# Patient Record
Sex: Female | Born: 1935
Health system: Southern US, Community
[De-identification: ages and names within clinical notes are randomized; demographics above are authoritative.]

## PROBLEM LIST (undated history)

## (undated) DIAGNOSIS — E785 Hyperlipidemia, unspecified: Secondary | ICD-10-CM

## (undated) DIAGNOSIS — F329 Major depressive disorder, single episode, unspecified: Secondary | ICD-10-CM

## (undated) DIAGNOSIS — E538 Deficiency of other specified B group vitamins: Secondary | ICD-10-CM

## (undated) DIAGNOSIS — Z8719 Personal history of other diseases of the digestive system: Secondary | ICD-10-CM

## (undated) DIAGNOSIS — J329 Chronic sinusitis, unspecified: Secondary | ICD-10-CM

## (undated) DIAGNOSIS — J189 Pneumonia, unspecified organism: Secondary | ICD-10-CM

## (undated) DIAGNOSIS — E8881 Metabolic syndrome: Secondary | ICD-10-CM

## (undated) DIAGNOSIS — E876 Hypokalemia: Secondary | ICD-10-CM

## (undated) DIAGNOSIS — F32A Depression, unspecified: Secondary | ICD-10-CM

## (undated) DIAGNOSIS — E039 Hypothyroidism, unspecified: Secondary | ICD-10-CM

## (undated) DIAGNOSIS — K5790 Diverticulosis of intestine, part unspecified, without perforation or abscess without bleeding: Secondary | ICD-10-CM

## (undated) DIAGNOSIS — R911 Solitary pulmonary nodule: Secondary | ICD-10-CM

## (undated) DIAGNOSIS — N6019 Diffuse cystic mastopathy of unspecified breast: Secondary | ICD-10-CM

## (undated) DIAGNOSIS — R079 Chest pain, unspecified: Secondary | ICD-10-CM

## (undated) DIAGNOSIS — K219 Gastro-esophageal reflux disease without esophagitis: Secondary | ICD-10-CM

## (undated) DIAGNOSIS — I10 Essential (primary) hypertension: Secondary | ICD-10-CM

## (undated) DIAGNOSIS — K6289 Other specified diseases of anus and rectum: Secondary | ICD-10-CM

## (undated) DIAGNOSIS — N6009 Solitary cyst of unspecified breast: Secondary | ICD-10-CM

## (undated) DIAGNOSIS — K222 Esophageal obstruction: Secondary | ICD-10-CM

## (undated) DIAGNOSIS — Z8619 Personal history of other infectious and parasitic diseases: Secondary | ICD-10-CM

## (undated) DIAGNOSIS — M109 Gout, unspecified: Secondary | ICD-10-CM

## (undated) DIAGNOSIS — D509 Iron deficiency anemia, unspecified: Secondary | ICD-10-CM

## (undated) DIAGNOSIS — E119 Type 2 diabetes mellitus without complications: Secondary | ICD-10-CM

## (undated) DIAGNOSIS — M1990 Unspecified osteoarthritis, unspecified site: Secondary | ICD-10-CM

## (undated) DIAGNOSIS — R197 Diarrhea, unspecified: Secondary | ICD-10-CM

## (undated) DIAGNOSIS — K449 Diaphragmatic hernia without obstruction or gangrene: Secondary | ICD-10-CM

## (undated) DIAGNOSIS — D649 Anemia, unspecified: Secondary | ICD-10-CM

## (undated) DIAGNOSIS — R609 Edema, unspecified: Secondary | ICD-10-CM

## (undated) HISTORY — DX: Anemia, unspecified: D64.9

## (undated) HISTORY — DX: Deficiency of other specified B group vitamins: E53.8

## (undated) HISTORY — DX: Diaphragmatic hernia without obstruction or gangrene: K44.9

## (undated) HISTORY — DX: Esophageal obstruction: K22.2

## (undated) HISTORY — DX: Diarrhea, unspecified: R19.7

## (undated) HISTORY — DX: Diverticulosis of intestine, part unspecified, without perforation or abscess without bleeding: K57.90

## (undated) HISTORY — PX: REPLACEMENT TOTAL KNEE: SUR1224

## (undated) HISTORY — DX: Essential (primary) hypertension: I10

## (undated) HISTORY — DX: Hyperlipidemia, unspecified: E78.5

## (undated) HISTORY — DX: Personal history of other infectious and parasitic diseases: Z86.19

## (undated) HISTORY — DX: Other specified diseases of anus and rectum: K62.89

## (undated) HISTORY — PX: TUBAL LIGATION: SHX77

## (undated) HISTORY — DX: Gastro-esophageal reflux disease without esophagitis: K21.9

## (undated) HISTORY — DX: Unspecified osteoarthritis, unspecified site: M19.90

## (undated) HISTORY — DX: Solitary cyst of unspecified breast: N60.09

## (undated) HISTORY — PX: PARTIAL HYSTERECTOMY: SHX80

## (undated) HISTORY — DX: Personal history of other diseases of the digestive system: Z87.19

## (undated) HISTORY — DX: Type 2 diabetes mellitus without complications: E11.9

## (undated) HISTORY — DX: Chronic sinusitis, unspecified: J32.9

## (undated) HISTORY — DX: Metabolic syndrome: E88.81

## (undated) HISTORY — DX: Solitary pulmonary nodule: R91.1

## (undated) HISTORY — DX: Edema, unspecified: R60.9

## (undated) HISTORY — PX: BLADDER SUSPENSION: SHX72

## (undated) HISTORY — DX: Depression, unspecified: F32.A

## (undated) HISTORY — DX: Iron deficiency anemia, unspecified: D50.9

## (undated) HISTORY — DX: Gout, unspecified: M10.9

## (undated) HISTORY — PX: SHOULDER SURGERY: SHX246

## (undated) HISTORY — DX: Diffuse cystic mastopathy of unspecified breast: N60.19

## (undated) HISTORY — DX: Metabolic syndrome: E88.810

## (undated) HISTORY — DX: Hypothyroidism, unspecified: E03.9

## (undated) HISTORY — DX: Chest pain, unspecified: R07.9

## (undated) HISTORY — PX: TONSILLECTOMY AND ADENOIDECTOMY: SHX28

## (undated) HISTORY — DX: Major depressive disorder, single episode, unspecified: F32.9

## (undated) HISTORY — PX: BREAST CYST EXCISION: SHX579

## (undated) HISTORY — DX: Hypokalemia: E87.6

---

## 1991-03-15 DIAGNOSIS — J189 Pneumonia, unspecified organism: Secondary | ICD-10-CM

## 1991-03-15 HISTORY — DX: Pneumonia, unspecified organism: J18.9

## 1997-11-21 ENCOUNTER — Emergency Department (HOSPITAL_COMMUNITY): Admission: EM | Admit: 1997-11-21 | Discharge: 1997-11-21 | Payer: Self-pay | Admitting: Emergency Medicine

## 2000-08-02 ENCOUNTER — Encounter: Admission: RE | Admit: 2000-08-02 | Discharge: 2000-08-02 | Payer: Self-pay | Admitting: Internal Medicine

## 2000-08-02 ENCOUNTER — Encounter: Payer: Self-pay | Admitting: Internal Medicine

## 2000-11-24 ENCOUNTER — Ambulatory Visit (HOSPITAL_BASED_OUTPATIENT_CLINIC_OR_DEPARTMENT_OTHER): Admission: RE | Admit: 2000-11-24 | Discharge: 2000-11-24 | Payer: Self-pay | Admitting: Orthopedic Surgery

## 2001-09-25 ENCOUNTER — Encounter: Admission: RE | Admit: 2001-09-25 | Discharge: 2001-09-25 | Payer: Self-pay | Admitting: Internal Medicine

## 2001-09-25 ENCOUNTER — Encounter: Payer: Self-pay | Admitting: Internal Medicine

## 2002-01-22 ENCOUNTER — Other Ambulatory Visit: Admission: RE | Admit: 2002-01-22 | Discharge: 2002-01-22 | Payer: Self-pay | Admitting: Obstetrics and Gynecology

## 2003-07-23 ENCOUNTER — Inpatient Hospital Stay (HOSPITAL_COMMUNITY): Admission: RE | Admit: 2003-07-23 | Discharge: 2003-07-28 | Payer: Self-pay | Admitting: Orthopedic Surgery

## 2003-12-05 ENCOUNTER — Inpatient Hospital Stay (HOSPITAL_COMMUNITY): Admission: RE | Admit: 2003-12-05 | Discharge: 2003-12-09 | Payer: Self-pay | Admitting: Orthopedic Surgery

## 2004-09-28 ENCOUNTER — Ambulatory Visit: Payer: Self-pay | Admitting: Gastroenterology

## 2004-10-08 ENCOUNTER — Ambulatory Visit: Payer: Self-pay | Admitting: Gastroenterology

## 2004-10-08 ENCOUNTER — Encounter (INDEPENDENT_AMBULATORY_CARE_PROVIDER_SITE_OTHER): Payer: Self-pay | Admitting: *Deleted

## 2004-11-23 ENCOUNTER — Ambulatory Visit: Payer: Self-pay | Admitting: Gastroenterology

## 2004-12-08 ENCOUNTER — Ambulatory Visit: Payer: Self-pay | Admitting: Gastroenterology

## 2005-01-05 ENCOUNTER — Encounter: Admission: RE | Admit: 2005-01-05 | Discharge: 2005-01-05 | Payer: Self-pay | Admitting: Internal Medicine

## 2005-10-24 ENCOUNTER — Encounter: Admission: RE | Admit: 2005-10-24 | Discharge: 2005-10-24 | Payer: Self-pay | Admitting: Internal Medicine

## 2006-08-31 ENCOUNTER — Ambulatory Visit: Payer: Self-pay | Admitting: Gastroenterology

## 2006-08-31 LAB — CONVERTED CEMR LAB
Basophils Absolute: 0.1 10*3/uL (ref 0.0–0.1)
Basophils Relative: 1.5 % — ABNORMAL HIGH (ref 0.0–1.0)
Eosinophils Absolute: 0.1 10*3/uL (ref 0.0–0.6)
Eosinophils Relative: 1.5 % (ref 0.0–5.0)
Ferritin: 7.7 ng/mL — ABNORMAL LOW (ref 10.0–291.0)
Folate: 11.3 ng/mL
Iron: 47 ug/dL (ref 42–145)
Lymphocytes Relative: 25.8 % (ref 12.0–46.0)
MCV: 83.9 fL (ref 78.0–100.0)
Monocytes Relative: 10.4 % (ref 3.0–11.0)
Neutrophils Relative %: 60.8 % (ref 43.0–77.0)
RBC: 4.65 M/uL (ref 3.87–5.11)
RDW: 12.9 % (ref 11.5–14.6)
Saturation Ratios: 7.9 % — ABNORMAL LOW (ref 20.0–50.0)
Transferrin: 427.6 mg/dL — ABNORMAL HIGH (ref >=212.0)
Vitamin B-12: 234 pg/mL (ref 211–911)
WBC: 7.3 10*3/uL (ref 4.5–10.5)

## 2007-03-23 ENCOUNTER — Ambulatory Visit: Payer: Self-pay | Admitting: Gastroenterology

## 2007-03-23 LAB — CONVERTED CEMR LAB
ALT: 15 units/L (ref 0–35)
AST: 21 units/L (ref 0–37)
BUN: 15 mg/dL (ref 6–23)
Basophils Relative: 0.6 % (ref 0.0–1.0)
Bilirubin, Direct: 0.1 mg/dL (ref 0.0–0.3)
Ferritin: 4.2 ng/mL — ABNORMAL LOW (ref 10.0–291.0)
Folate: 11.2 ng/mL
HCT: 25.2 % — ABNORMAL LOW (ref 36.0–46.0)
Iron: 13 ug/dL — ABNORMAL LOW (ref 42–145)
MCHC: 33.8 g/dL (ref 30.0–36.0)
MCV: 83.6 fL (ref 78.0–100.0)
Monocytes Relative: 10.8 % (ref 3.0–11.0)
Neutrophils Relative %: 66.8 % (ref 43.0–77.0)
Sodium: 136 meq/L (ref 135–145)
TSH: 2.31 microintl units/mL (ref 0.35–5.50)
Total Bilirubin: 0.7 mg/dL (ref 0.3–1.2)
Total Protein: 6.9 g/dL (ref 6.0–8.3)
Vitamin B-12: 216 pg/mL (ref 211–911)
WBC: 8.5 10*3/uL (ref 4.5–10.5)

## 2007-03-28 ENCOUNTER — Encounter: Payer: Self-pay | Admitting: Gastroenterology

## 2007-03-28 ENCOUNTER — Ambulatory Visit: Payer: Self-pay | Admitting: Gastroenterology

## 2007-04-02 ENCOUNTER — Ambulatory Visit: Payer: Self-pay | Admitting: Cardiovascular Disease

## 2007-04-05 ENCOUNTER — Ambulatory Visit: Payer: Self-pay | Admitting: Gastroenterology

## 2007-04-11 ENCOUNTER — Ambulatory Visit: Payer: Self-pay | Admitting: Gastroenterology

## 2007-04-11 ENCOUNTER — Ambulatory Visit: Payer: Self-pay

## 2007-04-11 ENCOUNTER — Ambulatory Visit: Payer: Self-pay | Admitting: Cardiology

## 2007-04-27 ENCOUNTER — Ambulatory Visit: Payer: Self-pay | Admitting: Gastroenterology

## 2007-04-27 LAB — CONVERTED CEMR LAB
Basophils Absolute: 0.1 10*3/uL (ref 0.0–0.1)
Eosinophils Relative: 1.7 % (ref 0.0–5.0)
Hemoglobin: 11 g/dL — ABNORMAL LOW (ref 12.0–15.0)
Lymphocytes Relative: 31.6 % (ref 12.0–46.0)
MCHC: 31.9 g/dL (ref 30.0–36.0)
Monocytes Absolute: 0.8 10*3/uL — ABNORMAL HIGH (ref 0.2–0.7)
Monocytes Relative: 12.8 % — ABNORMAL HIGH (ref 3.0–11.0)
Platelets: 416 10*3/uL — ABNORMAL HIGH (ref 150–400)
RBC: 4.36 M/uL (ref 3.87–5.11)
RDW: 16.2 % — ABNORMAL HIGH (ref 11.5–14.6)
Saturation Ratios: 2.8 % — ABNORMAL LOW (ref 20.0–50.0)
Transferrin: 406.4 mg/dL — ABNORMAL HIGH (ref >=212.0)
Vitamin B-12: 561 pg/mL (ref 211–911)

## 2007-05-08 ENCOUNTER — Ambulatory Visit: Payer: Self-pay | Admitting: Gastroenterology

## 2007-05-08 LAB — CONVERTED CEMR LAB
OCCULT 1: NEGATIVE
OCCULT 2: NEGATIVE
OCCULT 5: NEGATIVE

## 2007-05-11 ENCOUNTER — Ambulatory Visit: Payer: Self-pay | Admitting: Gastroenterology

## 2007-05-24 ENCOUNTER — Ambulatory Visit: Payer: Self-pay | Admitting: Gastroenterology

## 2007-06-08 ENCOUNTER — Ambulatory Visit: Payer: Self-pay | Admitting: Gastroenterology

## 2007-07-07 DIAGNOSIS — I10 Essential (primary) hypertension: Secondary | ICD-10-CM

## 2007-07-07 DIAGNOSIS — K449 Diaphragmatic hernia without obstruction or gangrene: Secondary | ICD-10-CM | POA: Insufficient documentation

## 2007-07-07 DIAGNOSIS — E039 Hypothyroidism, unspecified: Secondary | ICD-10-CM | POA: Insufficient documentation

## 2007-07-07 DIAGNOSIS — E7800 Pure hypercholesterolemia, unspecified: Secondary | ICD-10-CM

## 2007-07-07 DIAGNOSIS — K219 Gastro-esophageal reflux disease without esophagitis: Secondary | ICD-10-CM

## 2007-07-07 DIAGNOSIS — M1990 Unspecified osteoarthritis, unspecified site: Secondary | ICD-10-CM | POA: Insufficient documentation

## 2007-07-10 ENCOUNTER — Ambulatory Visit: Payer: Self-pay | Admitting: Gastroenterology

## 2007-08-09 ENCOUNTER — Ambulatory Visit: Payer: Self-pay | Admitting: Gastroenterology

## 2007-09-07 ENCOUNTER — Ambulatory Visit: Payer: Self-pay | Admitting: Gastroenterology

## 2007-09-07 DIAGNOSIS — E538 Deficiency of other specified B group vitamins: Secondary | ICD-10-CM | POA: Insufficient documentation

## 2007-10-08 ENCOUNTER — Ambulatory Visit: Payer: Self-pay | Admitting: Gastroenterology

## 2007-11-05 ENCOUNTER — Ambulatory Visit: Payer: Self-pay | Admitting: Gastroenterology

## 2007-12-06 ENCOUNTER — Ambulatory Visit: Payer: Self-pay | Admitting: Gastroenterology

## 2007-12-10 ENCOUNTER — Ambulatory Visit: Payer: Self-pay | Admitting: Cardiovascular Disease

## 2007-12-10 LAB — CONVERTED CEMR LAB
BUN: 12 mg/dL (ref 6–23)
CO2: 34 meq/L — ABNORMAL HIGH (ref 19–32)
Calcium: 9.3 mg/dL (ref 8.4–10.5)
Chloride: 107 meq/L (ref 96–112)
Creatinine, Ser: 1.2 mg/dL (ref 0.4–1.2)
GFR calc non Af Amer: 46 mL/min
Glucose, Bld: 113 mg/dL — ABNORMAL HIGH (ref 70–99)

## 2007-12-21 ENCOUNTER — Ambulatory Visit: Payer: Self-pay | Admitting: Internal Medicine

## 2007-12-21 LAB — CONVERTED CEMR LAB
Bacteria, UA: NEGATIVE
Basophils Absolute: 0 10*3/uL (ref 0.0–0.1)
Bilirubin Urine: NEGATIVE
Bilirubin, Direct: 0.3 mg/dL (ref 0.0–0.3)
CO2: 32 meq/L (ref 19–32)
Creatinine, Ser: 1.3 mg/dL — ABNORMAL HIGH (ref 0.4–1.2)
Eosinophils Absolute: 0.1 10*3/uL (ref 0.0–0.7)
Eosinophils Relative: 0.9 % (ref 0.0–5.0)
Folate: 8.8 ng/mL
GFR calc Af Amer: 51 mL/min
Glucose, Bld: 111 mg/dL — ABNORMAL HIGH (ref 70–99)
Hemoglobin, Urine: NEGATIVE
Hgb A1c MFr Bld: 6.5 % — ABNORMAL HIGH (ref 4.6–6.0)
Lymphocytes Relative: 22.6 % (ref 12.0–46.0)
Neutro Abs: 6.4 10*3/uL (ref 1.4–7.7)
Nitrite: NEGATIVE
Potassium: 4 meq/L (ref 3.5–5.1)
RBC: 4.77 M/uL (ref 3.87–5.11)
RDW: 13.4 % (ref 11.5–14.6)
Sodium: 141 meq/L (ref 135–145)
Specific Gravity, Urine: 1.01 (ref 1.000–1.03)
TSH: 3.03 microintl units/mL (ref 0.35–5.50)
Total CHOL/HDL Ratio: 3.6
Total Protein, Urine: NEGATIVE mg/dL
Urobilinogen, UA: 0.2 (ref 0.0–1.0)

## 2008-01-03 ENCOUNTER — Ambulatory Visit: Payer: Self-pay | Admitting: Gastroenterology

## 2008-01-21 ENCOUNTER — Ambulatory Visit: Payer: Self-pay | Admitting: Internal Medicine

## 2008-01-21 DIAGNOSIS — E8881 Metabolic syndrome: Secondary | ICD-10-CM

## 2008-01-21 DIAGNOSIS — E1165 Type 2 diabetes mellitus with hyperglycemia: Secondary | ICD-10-CM

## 2008-01-21 DIAGNOSIS — E1129 Type 2 diabetes mellitus with other diabetic kidney complication: Secondary | ICD-10-CM | POA: Insufficient documentation

## 2008-01-21 LAB — CONVERTED CEMR LAB
Cholesterol, target level: 200 mg/dL
HDL goal, serum: 40 mg/dL

## 2008-01-23 ENCOUNTER — Ambulatory Visit: Payer: Self-pay | Admitting: Cardiovascular Disease

## 2008-01-23 LAB — CONVERTED CEMR LAB
Calcium: 9.1 mg/dL (ref 8.4–10.5)
Creatinine, Ser: 1.4 mg/dL — ABNORMAL HIGH (ref 0.4–1.2)
GFR calc Af Amer: 47 mL/min
GFR calc non Af Amer: 39 mL/min
Glucose, Bld: 117 mg/dL — ABNORMAL HIGH (ref 70–99)
Sodium: 142 meq/L (ref 135–145)

## 2008-01-30 ENCOUNTER — Encounter: Payer: Self-pay | Admitting: Internal Medicine

## 2008-02-04 ENCOUNTER — Ambulatory Visit: Payer: Self-pay | Admitting: Gastroenterology

## 2008-03-04 ENCOUNTER — Ambulatory Visit: Payer: Self-pay | Admitting: Gastroenterology

## 2008-03-25 ENCOUNTER — Ambulatory Visit: Payer: Self-pay | Admitting: Internal Medicine

## 2008-03-25 LAB — CONVERTED CEMR LAB
ALT: 21 units/L (ref 0–35)
AST: 26 units/L (ref 0–37)
BUN: 20 mg/dL (ref 6–23)
CO2: 31 meq/L (ref 19–32)
Chloride: 104 meq/L (ref 96–112)
Creatinine, Ser: 1.4 mg/dL — ABNORMAL HIGH (ref 0.4–1.2)
GFR calc Af Amer: 47 mL/min
GFR calc non Af Amer: 39 mL/min
Leukocytes, UA: NEGATIVE
Microalb Creat Ratio: 2.3 mg/g (ref 0.0–30.0)
Mucus, UA: NEGATIVE
Nitrite: NEGATIVE
Potassium: 4.6 meq/L (ref 3.5–5.1)
RBC / HPF: NONE SEEN
Total Protein, Urine: NEGATIVE mg/dL
Urine Glucose: NEGATIVE mg/dL
Urobilinogen, UA: 0.2 (ref 0.0–1.0)
pH: 6 (ref 5.0–8.0)

## 2008-03-26 ENCOUNTER — Encounter: Payer: Self-pay | Admitting: Internal Medicine

## 2008-04-01 ENCOUNTER — Ambulatory Visit: Payer: Self-pay | Admitting: Gastroenterology

## 2008-05-02 ENCOUNTER — Ambulatory Visit: Payer: Self-pay | Admitting: Gastroenterology

## 2008-05-13 ENCOUNTER — Ambulatory Visit: Payer: Self-pay | Admitting: Cardiovascular Disease

## 2008-05-21 ENCOUNTER — Encounter: Payer: Self-pay | Admitting: Cardiovascular Disease

## 2008-05-21 ENCOUNTER — Ambulatory Visit: Payer: Self-pay

## 2008-05-30 ENCOUNTER — Ambulatory Visit: Payer: Self-pay | Admitting: Gastroenterology

## 2008-06-06 ENCOUNTER — Encounter: Payer: Self-pay | Admitting: Internal Medicine

## 2008-06-25 ENCOUNTER — Ambulatory Visit: Payer: Self-pay | Admitting: Internal Medicine

## 2008-06-25 LAB — CONVERTED CEMR LAB
AST: 21 units/L (ref 0–37)
Alkaline Phosphatase: 74 units/L (ref 39–117)
BUN: 19 mg/dL (ref 6–23)
Basophils Absolute: 0 10*3/uL (ref 0.0–0.1)
Basophils Relative: 0.1 % (ref 0.0–3.0)
Bilirubin Urine: NEGATIVE
Bilirubin, Direct: 0.1 mg/dL (ref 0.0–0.3)
CO2: 33 meq/L — ABNORMAL HIGH (ref 19–32)
Calcium: 9.2 mg/dL (ref 8.4–10.5)
Cholesterol: 273 mg/dL — ABNORMAL HIGH (ref 0–200)
Creatinine,U: 162.1 mg/dL
Eosinophils Absolute: 0.1 10*3/uL (ref 0.0–0.7)
Eosinophils Relative: 1.3 % (ref 0.0–5.0)
HDL: 39.6 mg/dL (ref >39.00)
Hgb A1c MFr Bld: 6.4 % (ref 4.6–6.5)
Ketones, ur: NEGATIVE mg/dL
Lymphocytes Relative: 17.1 % (ref 12.0–46.0)
Lymphs Abs: 1.9 10*3/uL (ref 0.7–4.0)
Magnesium: 2 mg/dL (ref 1.5–2.5)
Microalb Creat Ratio: 51.2 mg/g — ABNORMAL HIGH (ref 0.0–30.0)
Microalb, Ur: 8.3 mg/dL — ABNORMAL HIGH (ref 0.0–1.9)
Monocytes Absolute: 0.7 10*3/uL (ref 0.1–1.0)
RBC: 4.7 M/uL (ref 3.87–5.11)
RDW: 12.9 % (ref 11.5–14.6)
TSH: 4.42 microintl units/mL (ref 0.35–5.50)
Total CHOL/HDL Ratio: 7
Total Protein: 7.4 g/dL (ref 6.0–8.3)
Urine Glucose: NEGATIVE mg/dL
Urobilinogen, UA: 0.2 (ref 0.0–1.0)
VLDL: 40.8 mg/dL — ABNORMAL HIGH (ref 0.0–40.0)
Vitamin B-12: 1071 pg/mL — ABNORMAL HIGH (ref 211–911)
pH: 5.5 (ref 5.0–8.0)

## 2008-06-26 ENCOUNTER — Encounter: Payer: Self-pay | Admitting: Internal Medicine

## 2008-07-01 ENCOUNTER — Ambulatory Visit: Payer: Self-pay | Admitting: Gastroenterology

## 2008-07-25 ENCOUNTER — Ambulatory Visit: Payer: Self-pay | Admitting: Internal Medicine

## 2008-07-25 LAB — CONVERTED CEMR LAB
Hemoglobin, Urine: NEGATIVE
Ketones, ur: NEGATIVE mg/dL
pH: 5.5 (ref 5.0–8.0)

## 2008-07-27 ENCOUNTER — Telehealth: Payer: Self-pay | Admitting: Internal Medicine

## 2008-07-31 ENCOUNTER — Ambulatory Visit: Payer: Self-pay | Admitting: Gastroenterology

## 2008-09-04 ENCOUNTER — Ambulatory Visit: Payer: Self-pay | Admitting: Internal Medicine

## 2008-09-10 ENCOUNTER — Ambulatory Visit: Payer: Self-pay | Admitting: Internal Medicine

## 2008-09-11 ENCOUNTER — Encounter: Payer: Self-pay | Admitting: Internal Medicine

## 2008-09-17 ENCOUNTER — Encounter: Payer: Self-pay | Admitting: Internal Medicine

## 2008-09-17 ENCOUNTER — Encounter: Admission: RE | Admit: 2008-09-17 | Discharge: 2008-09-17 | Payer: Self-pay | Admitting: Internal Medicine

## 2008-10-06 ENCOUNTER — Ambulatory Visit: Payer: Self-pay | Admitting: Gastroenterology

## 2008-10-06 ENCOUNTER — Telehealth: Payer: Self-pay | Admitting: Internal Medicine

## 2008-10-09 ENCOUNTER — Ambulatory Visit: Payer: Self-pay | Admitting: Internal Medicine

## 2008-10-11 ENCOUNTER — Encounter: Payer: Self-pay | Admitting: Internal Medicine

## 2008-11-06 ENCOUNTER — Ambulatory Visit: Payer: Self-pay | Admitting: Gastroenterology

## 2008-11-21 DIAGNOSIS — E785 Hyperlipidemia, unspecified: Secondary | ICD-10-CM | POA: Insufficient documentation

## 2008-11-21 DIAGNOSIS — D649 Anemia, unspecified: Secondary | ICD-10-CM | POA: Insufficient documentation

## 2008-11-24 ENCOUNTER — Ambulatory Visit: Payer: Self-pay | Admitting: Cardiovascular Disease

## 2008-12-08 ENCOUNTER — Ambulatory Visit: Payer: Self-pay | Admitting: Gastroenterology

## 2009-01-05 ENCOUNTER — Ambulatory Visit: Payer: Self-pay | Admitting: Gastroenterology

## 2009-02-04 ENCOUNTER — Ambulatory Visit: Payer: Self-pay | Admitting: Gastroenterology

## 2009-03-10 ENCOUNTER — Ambulatory Visit: Payer: Self-pay | Admitting: Gastroenterology

## 2009-03-14 HISTORY — PX: CATARACT EXTRACTION, BILATERAL: SHX1313

## 2009-03-14 LAB — HM MAMMOGRAPHY: HM Mammogram: NORMAL

## 2009-04-10 ENCOUNTER — Ambulatory Visit: Payer: Self-pay | Admitting: Gastroenterology

## 2009-05-08 ENCOUNTER — Ambulatory Visit: Payer: Self-pay | Admitting: Gastroenterology

## 2009-06-09 ENCOUNTER — Ambulatory Visit: Payer: Self-pay | Admitting: Gastroenterology

## 2009-06-09 ENCOUNTER — Encounter (INDEPENDENT_AMBULATORY_CARE_PROVIDER_SITE_OTHER): Payer: Self-pay | Admitting: *Deleted

## 2009-06-12 DIAGNOSIS — K5790 Diverticulosis of intestine, part unspecified, without perforation or abscess without bleeding: Secondary | ICD-10-CM

## 2009-06-12 HISTORY — DX: Diverticulosis of intestine, part unspecified, without perforation or abscess without bleeding: K57.90

## 2009-06-24 ENCOUNTER — Ambulatory Visit: Payer: Self-pay | Admitting: Gastroenterology

## 2009-06-24 LAB — HM COLONOSCOPY

## 2009-06-26 ENCOUNTER — Telehealth: Payer: Self-pay | Admitting: Gastroenterology

## 2009-07-10 ENCOUNTER — Ambulatory Visit: Payer: Self-pay | Admitting: Gastroenterology

## 2009-07-10 LAB — CONVERTED CEMR LAB
Albumin: 4.1 g/dL (ref 3.5–5.2)
BUN: 13 mg/dL (ref 6–23)
Basophils Absolute: 0.1 10*3/uL (ref 0.0–0.1)
Basophils Relative: 0.9 % (ref 0.0–3.0)
Bilirubin, Direct: 0.1 mg/dL (ref 0.0–0.3)
Calcium: 9.4 mg/dL (ref 8.4–10.5)
Eosinophils Absolute: 0.1 10*3/uL (ref 0.0–0.7)
Eosinophils Relative: 1.9 % (ref 0.0–5.0)
Ferritin: 16.6 ng/mL (ref 10.0–291.0)
Folate: >20 ng/mL
GFR calc non Af Amer: 38.65 mL/min (ref >60)
Glucose, Bld: 106 mg/dL — ABNORMAL HIGH (ref 70–99)
Lymphs Abs: 1.6 10*3/uL (ref 0.7–4.0)
Neutrophils Relative %: 66.2 % (ref 43.0–77.0)
Platelets: 331 10*3/uL (ref 150.0–400.0)
Potassium: 4 meq/L (ref 3.5–5.1)
RDW: 12.4 % (ref 11.5–14.6)
Total Protein: 7.6 g/dL (ref 6.0–8.3)
Transferrin: 335.3 mg/dL (ref 212.0–360.0)
Vitamin B-12: >1500 pg/mL — ABNORMAL HIGH (ref 211–911)
WBC: 7 10*3/uL (ref 4.5–10.5)

## 2009-07-23 ENCOUNTER — Encounter: Payer: Self-pay | Admitting: Internal Medicine

## 2009-09-04 ENCOUNTER — Inpatient Hospital Stay (HOSPITAL_COMMUNITY): Admission: RE | Admit: 2009-09-04 | Discharge: 2009-09-07 | Payer: Self-pay | Admitting: Surgery

## 2009-10-14 ENCOUNTER — Encounter: Payer: Self-pay | Admitting: Internal Medicine

## 2009-10-28 ENCOUNTER — Ambulatory Visit: Payer: Self-pay | Admitting: Internal Medicine

## 2009-10-28 ENCOUNTER — Encounter: Payer: Self-pay | Admitting: Internal Medicine

## 2009-10-28 DIAGNOSIS — R209 Unspecified disturbances of skin sensation: Secondary | ICD-10-CM

## 2009-10-28 LAB — CONVERTED CEMR LAB
AST: 22 units/L (ref 0–37)
Albumin: 4.1 g/dL (ref 3.5–5.2)
Basophils Absolute: 0.1 10*3/uL (ref 0.0–0.1)
Bilirubin Urine: NEGATIVE
Bilirubin, Direct: 0.1 mg/dL (ref 0.0–0.3)
Cholesterol: 259 mg/dL — ABNORMAL HIGH (ref 0–200)
Direct LDL: 189.4 mg/dL
Eosinophils Absolute: 0.2 10*3/uL (ref 0.0–0.7)
GFR calc non Af Amer: 35.65 mL/min (ref >60)
Glucose, Bld: 96 mg/dL (ref 70–99)
HDL: 41.6 mg/dL (ref >39.00)
Hemoglobin, Urine: NEGATIVE
Hemoglobin: 13.2 g/dL (ref 12.0–15.0)
Ketones, ur: NEGATIVE mg/dL
Leukocytes, UA: NEGATIVE
Lymphocytes Relative: 26.2 % (ref 12.0–46.0)
Monocytes Relative: 11.6 % (ref 3.0–12.0)
Neutrophils Relative %: 58.3 % (ref 43.0–77.0)
Nitrite: NEGATIVE
Sodium: 143 meq/L (ref 135–145)
Specific Gravity, Urine: 1.02 (ref 1.000–1.030)
TSH: 2.17 microintl units/mL (ref 0.35–5.50)
Total Bilirubin: 0.7 mg/dL (ref 0.3–1.2)
Total CHOL/HDL Ratio: 6
Triglycerides: 256 mg/dL — ABNORMAL HIGH (ref 0.0–149.0)
Urine Glucose: NEGATIVE mg/dL
Urobilinogen, UA: 0.2 (ref 0.0–1.0)
VLDL: 51.2 mg/dL — ABNORMAL HIGH (ref 0.0–40.0)

## 2009-11-27 ENCOUNTER — Encounter: Payer: Self-pay | Admitting: Internal Medicine

## 2009-12-07 ENCOUNTER — Telehealth: Payer: Self-pay | Admitting: Gastroenterology

## 2009-12-14 ENCOUNTER — Ambulatory Visit: Payer: Self-pay | Admitting: Gastroenterology

## 2009-12-14 ENCOUNTER — Ambulatory Visit: Payer: Self-pay | Admitting: Internal Medicine

## 2009-12-14 DIAGNOSIS — M109 Gout, unspecified: Secondary | ICD-10-CM | POA: Insufficient documentation

## 2009-12-14 LAB — CONVERTED CEMR LAB
CO2: 27 meq/L (ref 19–32)
Chloride: 102 meq/L (ref 96–112)
GFR calc non Af Amer: 34.84 mL/min (ref >60)
Sodium: 140 meq/L (ref 135–145)
Vitamin B-12: >1500 pg/mL — ABNORMAL HIGH (ref 211–911)

## 2009-12-14 LAB — HM DIABETES FOOT EXAM

## 2010-01-12 ENCOUNTER — Ambulatory Visit: Payer: Self-pay | Admitting: Gastroenterology

## 2010-01-21 ENCOUNTER — Encounter: Payer: Self-pay | Admitting: Internal Medicine

## 2010-02-11 ENCOUNTER — Ambulatory Visit: Payer: Self-pay | Admitting: Gastroenterology

## 2010-03-11 ENCOUNTER — Ambulatory Visit: Payer: Self-pay | Admitting: Gastroenterology

## 2010-04-08 ENCOUNTER — Ambulatory Visit
Admission: RE | Admit: 2010-04-08 | Discharge: 2010-04-08 | Payer: Self-pay | Source: Home / Self Care | Attending: Gastroenterology | Admitting: Gastroenterology

## 2010-04-11 LAB — CONVERTED CEMR LAB
AST: 22 units/L (ref 0–37)
Albumin: 3.8 g/dL (ref 3.5–5.2)
Alkaline Phosphatase: 54 units/L (ref 39–117)
BUN: 17 mg/dL (ref 6–23)
Bilirubin Urine: NEGATIVE
Creatinine, Ser: 1.4 mg/dL — ABNORMAL HIGH (ref 0.4–1.2)
Eosinophils Absolute: 0.1 10*3/uL (ref 0.0–0.7)
Eosinophils Relative: 1.8 % (ref 0.0–5.0)
GFR calc non Af Amer: 38.72 mL/min (ref >60)
HCT: 41.8 % (ref 36.0–46.0)
Hemoglobin: 14.6 g/dL (ref 12.0–15.0)
Leukocytes, UA: NEGATIVE
Lymphs Abs: 1.7 10*3/uL (ref 0.7–4.0)
MCHC: 35.1 g/dL (ref 30.0–36.0)
MCV: 96.6 fL (ref 78.0–100.0)
Monocytes Absolute: 0.7 10*3/uL (ref 0.1–1.0)
Monocytes Relative: 10.3 % (ref 3.0–12.0)
Neutro Abs: 4.3 10*3/uL (ref 1.4–7.7)
Neutrophils Relative %: 61.8 % (ref 43.0–77.0)
RDW: 12.9 % (ref 11.5–14.6)
Total Bilirubin: 0.9 mg/dL (ref 0.3–1.2)
Total Protein: 7.2 g/dL (ref 6.0–8.3)
WBC: 6.8 10*3/uL (ref 4.5–10.5)

## 2010-04-13 NOTE — Letter (Signed)
Summary: [facility]   Imported By: Rise Patience 10/26/2009 15:46:27  _____________________________________________________________________  External Attachment:    Type:   Image     Comment:   External Document

## 2010-04-13 NOTE — Letter (Signed)
Summary: Results Follow-up Letter  [facility]   [place], [place] [address]   Phone: [phone]  Fax: [phone]    12/14/2009  [address] [place], [place]  [address]  Dear Ms. Swinford,   The following are the results of your recent test(s):  Test     Result     Kidney     mildly dysfunctional Gout test     high   _________________________________________________________  Please call for an appointment as directed _________________________________________________________ _________________________________________________________ _________________________________________________________  Sincerely,  Scarlette Calico MD [facility]

## 2010-04-13 NOTE — Progress Notes (Signed)
Summary: Surgical referral   Phone Note Outgoing Call   Call placed by: Alberteen Spindle RN,  June 26, 2009 3:03 PM Summary of Call: Per Dr. Sharlett Iles, pt needs referral to Dr Hassell Done at [facility] re fundoplication.  LM for [place] to call,  Records faxed. Initial call taken by: Alberteen Spindle RN,  June 26, 2009 3:03 PM  Follow-up for Phone Call        [place] calling.  Pt sch for appt to see Dr. Kathreen Cosier on May 12 at 10:00.  [facility] will notify pt.   Follow-up by: Alberteen Spindle RN,  June 26, 2009 4:14 PM

## 2010-04-13 NOTE — Assessment & Plan Note (Signed)
Summary: MONTHLY B12 AND OFFICE VISIT..AM.    History of Present Illness Visit Type: Follow-up Visit Primary GI MD: Verl Blalock MD [place] Primary Provider: Janith Lima MD Chief Complaint: Patient is having some right sided pain. She does not feel she is having a regular BM she would like a rx for Miralax so it can be covered by her insurance. She also has alot of questions about her new dx of a fatty liver, seen on an ultrasound ordered by Dr. Ronnald Ramp. She went also feels like her has something stuck in her throat all the time. She seen an ENT and was given alot of differnet mixture of meds which she is no longer taking.  History of Present Illness:   This patient is a 77 year old Caucasian female who has chronic iron deficiency anemia felt secondary to a large hiatal hernia with so-called Cameron erosions. She has had extensive GI evaluations over the last several years including endoscopies, colonoscopy, and pill camera exam. She is currently asymptomatic on Protonix 40 mg twice a day but does have cough, choking, and recurrent sinus problems despite treatment by ENT specialist. She desires allergy referral at this time.  She denies dysphagia ready hepatobiliary complaints. Recent abdominal ultrasound exam per Dr. Ronnald Ramp as showing fatty infiltration of her liver but liver function test are normal. She has intermittent right lower quadrant discomfort associated with constipation but no melena or hematochezia. She denies use of alcohol, cigarettes, or NSAIDs. She is on B12 replacement therapy has a history of hypertension and multiple surgical procedures include an appendectomy and cholecystectomy. She also has degenerative arthritis and chronic thyroid dysfunction.   GI Review of Systems    Reports abdominal pain.     Location of  Abdominal pain: right side.    Denies acid reflux, belching, bloating, chest pain, dysphagia with liquids, dysphagia with solids, heartburn, loss of appetite,  nausea, vomiting, vomiting blood, weight loss, and  weight gain.      Reports change in bowel habits.     Denies anal fissure, black tarry stools, constipation, diarrhea, diverticulosis, fecal incontinence, heme positive stool, hemorrhoids, irritable bowel syndrome, jaundice, light color stool, liver problems, rectal bleeding, and  rectal pain.    Current Medications (verified): 1)  Protonix 40 Mg  Tbec (Pantoprazole Sodium) .... Take One By Mouth Two Times A Day 2)  Hyzaar 50-12.5 Mg Tabs (Losartan Potassium-Hctz) .... One By Mouth Once Daily For High Blood Pressure 3)  Xalatan 0.005 % Soln (Latanoprost) .... One Drop in Both Eyes At Night 4)  Levoxyl 50 Mcg Tabs (Levothyroxine Sodium) .... Once Daily 5)  Multivitamins  Tabs (Multiple Vitamin) .... Take One By Mouth Once Daily 6)  Vitamin D 1000 Unit Tabs (Cholecalciferol) .... Take One By Mouth Once Daily  Allergies: 1)  ! Sulfa 2)  ! Codeine 3)  Cephalexin (Cephalexin) 4)  Septra (Sulfamethoxazole-Trimethoprim)  Past History:  Past medical, surgical, family and social histories (including risk factors) reviewed for relevance to current acute and chronic problems.  Past Medical History: Reviewed history from 11/21/2008 and no changes required. Current Problems:  CHEST PAIN (ICD-786.50) aytpical normal myovue 2009 HYPERTENSION, MILD (ICD-401.1) DYSPNEA (ICD-786.05) HYPERLIPIDEMIA (ICD-272.4) ANEMIA (ICD-285.9) EDEMA (ICD-782.3) DIARRHEA OF PRESUMED INFECTIOUS ORIGIN (ICD-009.3) UTI (ICD-599.0) FATIGUE, ACUTE (ICD-780.79) DYSPNEA ON EXERTION (ICD-786.09) ABDOMINAL PAIN RIGHT UPPER QUADRANT (A999333) DYSMETABOLIC SYNDROME (A999333) UNSPECIFIED SINUSITIS (ICD-473.9) AODM (ICD-250.00) B12 DEFICIENCY (ICD-266.2) HYPOKALEMIA (ICD-276.8) VITAMIN B12 DEFICIENCY (ICD-266.2) BREAST CYST, LEFT (ICD-610.0) APPENDECTOMY, HX OF (ICD-V45.79) CHOLECYSTECTOMY, HX OF (ICD-V45.79)  ARTHROSCOPY, RIGHT KNEE, HX OF  (ICD-V45.89) TONSILLECTOMY AND ADENOIDECTOMY, HX OF (ICD-V45.79) ANEMIA, IRON DEFICIENCY, CHRONIC (ICD-280.9) FIBROCYSTIC BREAST DISEASE (ICD-610.1) DEGENERATIVE JOINT DISEASE (ICD-715.90) LUNG NODULE (ICD-518.89) HYPERCHOLESTEROLEMIA (ICD-272.0) ACID REFLUX DISEASE (ICD-530.81) HIATAL HERNIA (ICD-553.3) HYPOTHYROIDISM (ICD-244.9) Breast cyst Acid reflux disease with esophageal stricture  Past Surgical History: BIL. SHOULDER SURGERY BLADDER TACK Total knee replacement Bilateral Hysterectomy part Tonsillectomy Tubal Ligation  Family History: Reviewed history from 12/21/2007 and no changes required. Family History of Arthritis Family History of Digestive disorder: brother has stomach cancer brother with bladder cancer Family History of Breast Cancer: maternal aunt  Social History: Reviewed history from 12/21/2007 and no changes required. Retired Married Never Smoked Alcohol use-no Drug use-no Regular exercise-yes Daily Caffeine Use  Review of Systems       The patient complains of allergy/sinus, arthritis/joint pain, back pain, change in vision, cough, depression-new, fatigue, headaches-new, hearing problems, muscle pains/cramps, night sweats, shortness of breath, sleeping problems, swelling of feet/legs, and urine leakage.  The patient denies anemia, anxiety-new, blood in urine, breast changes/lumps, confusion, coughing up blood, fainting, fever, heart murmur, heart rhythm changes, itching, menstrual pain, nosebleeds, pregnancy symptoms, skin rash, sore throat, swollen lymph glands, thirst - excessive , urination - excessive , urination changes/pain, vision changes, and voice change.   General:  Complains of fatigue; denies fever, chills, sweats, anorexia, weakness, malaise, weight loss, and sleep disorder. CV:  Complains of dyspnea on exertion; denies chest pains, angina, palpitations, syncope, orthopnea, PND, peripheral edema, and claudication. Resp:  Complains of dyspnea  with exercise, cough, and sputum; denies dyspnea at rest, wheezing, coughing up blood, and pleurisy. MS:  Complains of joint pain / LOM, joint swelling, muscle weakness, muscle cramps, and leg pain at night; denies joint stiffness, joint deformity, low back pain, muscle atrophy, leg pain with exertion, and shoulder pain / LOM hand / wrist pain (CTS). Psych:  Denies depression, anxiety, memory loss, suicidal ideation, hallucinations, paranoia, phobia, and confusion. Heme:  Denies bruising, bleeding, enlarged lymph nodes, and pagophagia.  Vital Signs:  Patient profile:   77 year old female Height:      62 inches Weight:      220.6 pounds BMI:     40.49 Pulse rate:   80 / minute Pulse rhythm:   regular BP sitting:   150 / 86  (left arm) Cuff size:   regular  Vitals Entered By: Bernita Buffy CMA Deborra Medina) (June 09, 2009 11:21 AM)  Physical Exam  General:  Well developed, well nourished, no acute distress.obese.   Head:  Normocephalic and atraumatic. Eyes:  PERRLA, no icterus. Mouth:  No deformity or lesions, dentition normal. Neck:  Supple; no masses or thyromegaly. Lungs:  Clear throughout to auscultation. Heart:  Regular rate and rhythm; no murmurs, rubs,  or bruits. Abdomen:  Soft, nontender and nondistended. No masses, hepatosplenomegaly or hernias noted. Normal bowel sounds.obese.   Rectal:  deferred until time of colonoscopy.   Msk:  joint tenderness, decreased ROM, and rhematoid changes.   Extremities:  No clubbing, cyanosis, edema or deformities noted.trace pedal edema.   Neurologic:  Alert and  oriented x4;  grossly normal neurologically. Cervical Nodes:  No significant cervical adenopathy. Psych:  Alert and cooperative. Normal mood and affect.poor concentration.     Impression & Recommendations:  Problem # 1:  ABDOMINAL PAIN RIGHT LOWER QUADRANT (ICD-789.03) Assessment Unchanged Followup colonoscopy indicated at this time. I also will repeat her labs to be sure that she  does not have recurrent iron deficiency anemia with  her shortness of breath and fatigue. We have prescribed nightly MiraLax and [facility] Colon Health tablets twice a day. Orders: TLB-BMP (Basic Metabolic Panel-BMET) (99991111) TLB-CBC Platelet - w/Differential (85025-CBCD) TLB-Hepatic/Liver Function Pnl (80076-HEPATIC) TLB-TSH (Thyroid Stimulating Hormone) (84443-TSH) TLB-B12, Serum-Total ONLY [id number]) TLB-Ferritin ([id number]) TLB-Folic Acid (Folate) (XX123456) TLB-IBC Pnl (Iron/FE;Transferrin) (83550-IBC)  Problem # 2:  OTHER CONSTIPATION (ICD-564.09) Assessment: Deteriorated  Orders: TLB-BMP (Basic Metabolic Panel-BMET) (99991111) TLB-CBC Platelet - w/Differential (85025-CBCD) TLB-Hepatic/Liver Function Pnl (80076-HEPATIC) TLB-TSH (Thyroid Stimulating Hormone) (84443-TSH) TLB-B12, Serum-Total ONLY [id number]) TLB-Ferritin ([id number]) TLB-Folic Acid (Folate) (XX123456) TLB-IBC Pnl (Iron/FE;Transferrin) (83550-IBC)  Problem # 3:  ANEMIA (ICD-285.9) Assessment: Unchanged Repeat labs pending. Also will do followup endoscopy the time of colonoscopy to visualize her suspected site of GI chronic blood loss. She is to continue her reflux regime and b.i.d. PPI therapy.  Problem # 4:  UNSPECIFIED SINUSITIS (ICD-473.9) Assessment: Deteriorated Allergy referral to [place] allergy specialist.  Patient Instructions: 1)  Please go to the basement for lab work. 2)  [place] twice a day. 3)  You will be scheduled for a colonoscopy and endoscopy. 4)  We will schedule you for an appt with the allergists. 5)  The medication list was reviewed and reconciled.  All changed / newly prescribed medications were explained.  A complete medication list was provided to the patient / caregiver. 6)  Copy sent to : Dr. Genelle Gather Crissie Sickles at [facility]. 7)  Please continue current medications.   Appended Document: MONTHLY B12 AND OFFICE  VISIT..AM.    Clinical Lists Changes  Medications: Added new medication of MOVIPREP 100 GM  SOLR (PEG-KCL-NACL-NASULF-NA ASC-C) As per prep instructions. - Signed Rx of MOVIPREP 100 GM  SOLR (PEG-KCL-NACL-NASULF-NA ASC-C) As per prep instructions.;  #1 x 0;  Signed;  Entered by: Alberteen Spindle RN;  Authorized by: Sable Feil MD [facility];  Method used: Electronically to [facility] [phone]*, [address] [place], [place], [place]  [id number], Ph: [phone] or [phone], Fax: [phone] Orders: Added new Test order of Colon/Endo (Colon/Endo) - Signed    Prescriptions: MOVIPREP 100 GM  SOLR (PEG-KCL-NACL-NASULF-NA ASC-C) As per prep instructions.  #1 x 0   Entered by:   Alberteen Spindle RN   Authorized by:   Sable Feil MD [facility]   Signed by:   Alberteen Spindle RN on 06/09/2009   Method used:   Electronically to        [facility] [phone]* (retail)       [place], [place]  [id number]       Ph: [phone] or [phone]       Fax: [phone]   RxID:   [id number]    Appended Document: MONTHLY B12 AND OFFICE VISIT..AM.    Clinical Lists Changes  Orders: Added new Service order of Vit B12 1000 mcg ([id number]) - Signed       Medication Administration  Injection # 1:    Medication: Vit B12 1000 mcg    Diagnosis: B12 DEFICIENCY (ICD-266.2)    Route: IM    Site: R deltoid    Exp Date: 12/12    Lot #: [id number]    Mfr: [place]    Patient tolerated injection without complications    Given by: Alberteen Spindle RN (June 09, 2009 1:37 PM)  Orders Added: 1)  Vit B12 1000 mcg [id number]

## 2010-04-13 NOTE — Assessment & Plan Note (Signed)
Summary: b 12 & flu shot...as.  Nurse Visit   Allergies: 1)  ! Sulfa 2)  ! Codeine 3)  Cephalexin (Cephalexin) 4)  Septra  Medication Administration  Injection # 1:    Medication: Vit B12 1000 mcg    Diagnosis: VITAMIN B12 DEFICIENCY (ICD-266.2)    Route: IM    Site: R deltoid    Exp Date: 09/2011    Lot #: [id number]    Mfr: [employer] [place]    Comments: pt to schedule next monthly b12 at front desk    Patient tolerated injection without complications    Given by: Christian Mate CMA Deborra Medina) (December 14, 2009 11:29 AM)  Orders Added: 1)  Flu Vaccine 51yrs + MEDICARE PATIENTS [Q2039] 2)  Administration Flu vaccine - MCR [id number] 3)  Vit B12 1000 mcg [J3420] Flu Vaccine Consent Questions     Do you have a history of severe allergic reactions to this vaccine? no    Any prior history of allergic reactions to egg and/or gelatin? no    Do you have a sensitivity to the preservative Thimersol? no    Do you have a past history of Guillan-Barre Syndrome? no    Do you currently have an acute febrile illness? no    Have you ever had a severe reaction to latex? no    Vaccine information given and explained to patient? yes    Are you currently pregnant? no    Lot Number:[id number]   Exp Date:09/01/2010   Manufacturer: [employer]    Site Given  Left Deltoid [email or web address]

## 2010-04-13 NOTE — Assessment & Plan Note (Signed)
Summary: B12 SHOT  Nurse Visit  Medication Administration  Injection # 1:    Medication: Vit B12 1000 mcg    Diagnosis: VITAMIN B12 DEFICIENCY (ICD-266.2)    Route: IM    Site: L deltoid    Exp Date: 02/2011    Lot #: [id number]    Mfr: [employer] Regent    Comments: pt will hold B12 injectins for 6 months and return in October 2011 for repeat B12.    Patient tolerated injection without complications    Given by: Abelino Derrick CMA Deborra Medina) (July 10, 2009 11:25 AM)  Orders Added: 1)  Vit B12 1000 mcg [id number]

## 2010-04-13 NOTE — Assessment & Plan Note (Signed)
Summary: follow up to discuss labs and xrays and nerve conduction stud...   Vital Signs:  Patient profile:   77 year old female Height:      62 inches Weight:      207 pounds O2 Sat:      97 % on Room air Temp:     97.7 degrees F oral Pulse rate:   84 / minute Pulse rhythm:   regular Resp:     16 per minute BP sitting:   140 / 80  (left arm) Cuff size:   large  Vitals Entered By: Estell Harpin CMA (December 14, 2009 12:55 PM)  O2 Flow:  Room air CC: follow-up visit test results, Hypertension Management Is Patient Diabetic? No  Does patient need assistance? Functional Status Self care Ambulation Normal   Primary Care Provider:  Janith Lima MD  CC:  follow-up visit test results and Hypertension Management.  History of Present Illness: She returns for f/up and she complains of diffuse toe pain and she wants to be tested and treated for Gout.  Hypertension History:      She denies headache, chest pain, palpitations, dyspnea with exertion, orthopnea, PND, peripheral edema, visual symptoms, neurologic problems, syncope, and side effects from treatment.  She notes no problems with any antihypertensive medication side effects.        Positive major cardiovascular risk factors include female age 46 years old or older, diabetes, hyperlipidemia, and hypertension.  Negative major cardiovascular risk factors include negative family history for ischemic heart disease and non-tobacco-user status.        Further assessment for target organ damage reveals no history of ASHD, cardiac end-organ damage (CHF/LVH), stroke/TIA, peripheral vascular disease, renal insufficiency, or hypertensive retinopathy.     Allergies (verified): 1)  ! Sulfa 2)  ! Codeine 3)  Cephalexin (Cephalexin) 4)  Septra  Past History:  Past Medical History: Last updated: 11/21/2008 Current Problems:  CHEST PAIN (ICD-786.50) aytpical normal myovue 2009 HYPERTENSION, MILD (ICD-401.1) DYSPNEA  (ICD-786.05) HYPERLIPIDEMIA (ICD-272.4) ANEMIA (ICD-285.9) EDEMA (ICD-782.3) DIARRHEA OF PRESUMED INFECTIOUS ORIGIN (ICD-009.3) UTI (ICD-599.0) FATIGUE, ACUTE (ICD-780.79) DYSPNEA ON EXERTION (ICD-786.09) ABDOMINAL PAIN RIGHT UPPER QUADRANT (A999333) DYSMETABOLIC SYNDROME (A999333) UNSPECIFIED SINUSITIS (ICD-473.9) AODM (ICD-250.00) B12 DEFICIENCY (ICD-266.2) HYPOKALEMIA (ICD-276.8) VITAMIN B12 DEFICIENCY (ICD-266.2) BREAST CYST, LEFT (ICD-610.0) APPENDECTOMY, HX OF (ICD-V45.79) CHOLECYSTECTOMY, HX OF (ICD-V45.79) ARTHROSCOPY, RIGHT KNEE, HX OF (ICD-V45.89) TONSILLECTOMY AND ADENOIDECTOMY, HX OF (ICD-V45.79) ANEMIA, IRON DEFICIENCY, CHRONIC (ICD-280.9) FIBROCYSTIC BREAST DISEASE (ICD-610.1) DEGENERATIVE JOINT DISEASE (ICD-715.90) LUNG NODULE (ICD-518.89) HYPERCHOLESTEROLEMIA (ICD-272.0) ACID REFLUX DISEASE (ICD-530.81) HIATAL HERNIA (ICD-553.3) HYPOTHYROIDISM (ICD-244.9) Breast cyst Acid reflux disease with esophageal stricture  Past Surgical History: Last updated: 06/09/2009 BIL. SHOULDER SURGERY BLADDER TACK Total knee replacement Bilateral Hysterectomy part Tonsillectomy Tubal Ligation  Family History: Last updated: 06/09/2009 Family History of Arthritis Family History of Digestive disorder: brother has stomach cancer brother with bladder cancer Family History of Breast Cancer: maternal aunt  Social History: Last updated: 06/09/2009 Retired Married Never Smoked Alcohol use-no Drug use-no Regular exercise-yes Daily Caffeine Use  Risk Factors: Alcohol Use: 0 (10/28/2009) Exercise: yes (12/21/2007)  Risk Factors: Smoking Status: never (10/28/2009) Passive Smoke Exposure: yes (10/28/2009)  Family History: Reviewed history from 06/09/2009 and no changes required. Family History of Arthritis Family History of Digestive disorder: brother has stomach cancer brother with bladder cancer Family History of Breast Cancer: maternal aunt  Social  History: Reviewed history from 06/09/2009 and no changes required. Retired Married Never Smoked Alcohol use-no Drug use-no Regular exercise-yes Daily Caffeine Use  Review of Systems       The patient complains of weight gain.  The patient denies anorexia, fever, chest pain, syncope, dyspnea on exertion, peripheral edema, prolonged cough, headaches, hemoptysis, abdominal pain, enlarged lymph nodes, and angioedema.   MS:  Complains of joint pain; denies joint redness, joint swelling, loss of strength, muscle aches, and muscle weakness.  Physical Exam  General:  alert, well-developed, well-nourished, well-hydrated, good hygiene, and overweight-appearing.   Head:  normocephalic and atraumatic.   Eyes:  vision grossly intact and pupils equal.   Mouth:  Oral mucosa and oropharynx without lesions or exudates.  Teeth in good repair. Neck:  supple, full ROM, no masses, no carotid bruits, and no neck tenderness.   Lungs:  Normal respiratory effort, chest expands symmetrically. Lungs are clear to auscultation, no crackles or wheezes. Heart:  Normal rate and regular rhythm. S1 and S2 normal without gallop, murmur, click, rub or other extra sounds. Abdomen:  Bowel sounds positive,abdomen soft and non-tender without masses, organomegaly or hernias noted. Msk:  normal ROM, no joint tenderness, no joint swelling, no joint warmth, no redness over joints, no joint deformities, no joint instability, no crepitation, and no muscle atrophy.   Pulses:  R and L carotid,radial,femoral,dorsalis pedis and posterior tibial pulses are full and equal bilaterally Extremities:  1+ left pedal edema and 1+ right pedal edema.   Neurologic:  No cranial nerve deficits noted. Station and gait are normal. Plantar reflexes are down-going bilaterally. DTRs are symmetrical throughout. Sensory, motor and coordinative functions appear intact. Cervical Nodes:  no anterior cervical adenopathy and no posterior cervical adenopathy.    Axillary Nodes:  no R axillary adenopathy and no L axillary adenopathy.   Psych:  Cognition and judgment appear intact. Alert and cooperative with normal attention span and concentration. No apparent delusions, illusions, hallucinations  Diabetes Management Exam:    Foot Exam (with socks and/or shoes not present):       Sensory-Pinprick/Light touch:          Left medial foot (L-4): normal          Left dorsal foot (L-5): normal          Left lateral foot (S-1): normal          Right medial foot (L-4): normal          Right dorsal foot (L-5): normal          Right lateral foot (S-1): normal       Sensory-Monofilament:          Left foot: normal          Right foot: normal       Inspection:          Left foot: normal          Right foot: normal       Nails:          Left foot: normal          Right foot: normal   Impression & Recommendations:  Problem # 1:  GOUT (ICD-274.9) Assessment New  Her updated medication list for this problem includes:    Colcrys 0.6 Mg Tabs (Colchicine) ..... One by mouth two times a day for gout pain  Orders: Venipuncture [id number]) TLB-BMP (Basic Metabolic Panel-BMET) (99991111) TLB-Uric Acid, Blood (84550-URIC)  Problem # 2:  HYPERTENSION, MILD (ICD-401.1) Assessment: Unchanged  Her updated medication list for this problem includes:    Hyzaar 50-12.5 Mg Tabs (Losartan potassium-hctz) ..... One by  mouth once daily for high blood pressure  Orders: Venipuncture [id number]) TLB-BMP (Basic Metabolic Panel-BMET) (99991111) TLB-Uric Acid, Blood (84550-URIC)  BP today: 140/80 Prior BP: 128/70 (10/28/2009)  Prior 10 Yr Risk Heart Disease: Not enough information (06/25/2008)  Labs Reviewed: K+: 4.4 (10/28/2009) Creat: : 1.5 (10/28/2009)   Chol: 259 (10/28/2009)   HDL: 41.60 (10/28/2009)   LDL: DEL (12/21/2007)   TG: 256.0 (10/28/2009)  Problem # 3:  PARESTHESIA (ICD-782.0) based on the NCS and EMG this looks like it is a combined insult  from B12 deficiency and Type II DM.  Problem # 4:  AODM (ICD-250.00) Assessment: Improved  Her updated medication list for this problem includes:    Hyzaar 50-12.5 Mg Tabs (Losartan potassium-hctz) ..... One by mouth once daily for high blood pressure  Orders: Ophthalmology Referral (Ophthalmology)  Labs Reviewed: Creat: 1.5 (10/28/2009)     Last Eye Exam: normal (03/03/2008) Reviewed HgBA1c results: 6.7 (10/28/2009)  6.4 (09/10/2008)  Problem # 5:  HYPERCHOLESTEROLEMIA (ICD-272.0) Assessment: Deteriorated  Her updated medication list for this problem includes:    Vytorin 10-20 Mg Tabs (Ezetimibe-simvastatin) ..... One by mouth once daily for cholesterol  Labs Reviewed: SGOT: 22 (10/28/2009)   SGPT: 20 (10/28/2009)  Lipid Goals: Chol Goal: 200 (01/21/2008)   HDL Goal: 40 (01/21/2008)   LDL Goal: 100 (01/21/2008)   TG Goal: 150 (01/21/2008)  Prior 10 Yr Risk Heart Disease: Not enough information (06/25/2008)   HDL:41.60 (10/28/2009), 39.60 (06/25/2008)  LDL:DEL (12/21/2007)  Chol:259 (10/28/2009), 273 (06/25/2008)  Trig:256.0 (10/28/2009), 204.0 (06/25/2008)  Complete Medication List: 1)  Protonix 40 Mg Tbec (Pantoprazole sodium) .... Take one by mouth two times a day 2)  Hyzaar 50-12.5 Mg Tabs (Losartan potassium-hctz) .... One by mouth once daily for high blood pressure 3)  Xalatan 0.005 % Soln (Latanoprost) .... One drop in both eyes at night 4)  Levoxyl 50 Mcg Tabs (Levothyroxine sodium) .... Once daily 5)  Multivitamins Tabs (Multiple vitamin) .... Take one by mouth once daily 6)  Vitamin D 1000 Unit Tabs (Cholecalciferol) .... Take one by mouth once daily 7)  [place] (Probiotic product) .... Two times a day 8)  Vytorin 10-20 Mg Tabs (Ezetimibe-simvastatin) .... One by mouth once daily for cholesterol 9)  Colcrys 0.6 Mg Tabs (Colchicine) .... One by mouth two times a day for gout pain  Hypertension Assessment/Plan:      The patient's hypertensive  risk group is category C: Target organ damage and/or diabetes.  Today's blood pressure is 140/80.  Her blood pressure goal is < 130/80.  Patient Instructions: 1)  Please schedule a follow-up appointment in 2 months. 2)  It is important that you exercise regularly at least 20 minutes 5 times a week. If you develop chest pain, have severe difficulty breathing, or feel very tired , stop exercising immediately and seek medical attention. 3)  You need to lose weight. Consider a lower calorie diet and regular exercise.  4)  Check your blood sugars regularly. If your readings are usually above 200 or below 70 you should contact our office. 5)  It is important that your Diabetic A1c level is checked every 3 months. 6)  See your eye doctor yearly to check for diabetic eye damage. 7)  Check your feet each night for sore areas, calluses or signs of infection. 8)  Check your Blood Pressure regularly. If it is above 130/80: you should make an appointment. Prescriptions: COLCRYS 0.6 MG TABS (COLCHICINE) One by mouth two times a day  for gout pain  #60 x 11   Entered and Authorized by:   Janith Lima MD   Signed by:   Janith Lima MD on 12/14/2009   Method used:   Electronically to        [facility] [phone]* (retail)       [place], [place]  [id number]       Ph: [phone] or [phone]       Fax: [phone]   RxID:   [id number] VYTORIN 10-20 MG TABS (EZETIMIBE-SIMVASTATIN) One by mouth once daily for cholesterol  #116 x 0   Entered and Authorized by:   Janith Lima MD   Signed by:   Janith Lima MD on 12/14/2009   Method used:   Samples Given   RxID:   [id number]

## 2010-04-13 NOTE — Letter (Signed)
Summary: [facility]   Imported By: Phillis Knack 08/06/2009 13:19:38  _____________________________________________________________________  External Attachment:    Type:   Image     Comment:   External Document

## 2010-04-13 NOTE — Procedures (Signed)
Summary: Colonoscopy  Patient: [name] Note: All result statuses are Final unless otherwise noted.  Tests: (1) Colonoscopy (COL)   COL Colonoscopy           [facility]     [place], [place]  [id number]           COLONOSCOPY PROCEDURE REPORT           PATIENT:  Yesenia [name], Yesenia [name]  MR#:  [id number]     BIRTHDATE:  1929/04/22, 79 yrs. old  GENDER:  female     ENDOSCOPIST:  Loralee Pacas. Sharlett Iles, MD, [facility]     REF. BY:     PROCEDURE DATE:  06/24/2009     PROCEDURE:  Average-risk screening colonoscopy     G0121     ASA CLASS:  Class II     INDICATIONS:  Routine Risk Screening     MEDICATIONS:   Fentanyl 50 mcg IV, Versed 6 mg IV           DESCRIPTION OF PROCEDURE:   After the risks benefits and     alternatives of the procedure were thoroughly explained, informed     consent was obtained.  Digital rectal exam was performed and     revealed no abnormalities.   The LB [id number] endoscope     was introduced through the anus and advanced to the cecum, which     was identified by both the appendix and ileocecal valve, without     limitations.  The quality of the prep was excellent, using     MoviPrep.  The instrument was then slowly withdrawn as the colon     was fully examined.     <<PROCEDUREIMAGES>>           FINDINGS:  Mild diverticulosis was found in the sigmoid to     descending colon segments.  No polyps or cancers were seen.  This     was otherwise a normal examination of the colon.   Retroflexed     views in the rectum revealed hypertrophied anal papillae.    The     scope was then withdrawn from the patient and the procedure     completed.           COMPLICATIONS:  None     ENDOSCOPIC IMPRESSION:     1) Mild diverticulosis in the sigmoid to descending colon     segments     2) No polyps or cancers     3) Otherwise normal examination     4) Hypertrophied anal papillae     RECOMMENDATIONS:     1) high fiber diet     REPEAT  EXAM:  No           ______________________________     Loralee Pacas. Sharlett Iles, MD, [facility]           CC:  Janith Lima, MD           n.     Lorrin MaisMarland Kitchen   Loralee Pacas. Nechuma Boven at 06/24/2009 02:04 PM           Karna Christmas, [id number]  Note: An exclamation mark (!) indicates a result that was not dispersed into the flowsheet. Document Creation Date: 06/24/2009 2:05 PM _______________________________________________________________________  (1) Order result status: Final Collection or observation date-time: 06/24/2009 14:00 Requested date-time:  Receipt date-time:  Reported date-time:  Referring Physician:   Ordering Physician: Shanon Brow  Sharlett Iles [phone]) Specimen Source:  Source: Tawanna Cooler Order Number: [id number] Lab site:

## 2010-04-13 NOTE — Assessment & Plan Note (Signed)
Summary: yearly f/u    Vital Signs:  Patient profile:   77 year old female Height:      62 inches Weight:      209.31 pounds BMI:     38.42 O2 Sat:      97 % on Room air Temp:     97.9 degrees F oral Pulse rate:   72 / minute Pulse rhythm:   regular Resp:     16 per minute BP sitting:   128 / 70  (left arm) Cuff size:   regular  Vitals Entered By: Estell Harpin CMA (October 28, 2009 9:19 AM)  Nutrition Counseling: Patient's BMI is greater than 25 and therefore counseled on weight management options.  O2 Flow:  Room air CC: f/up Is Patient Diabetic? Yes Did you bring your meter with you today? No Pain Assessment Patient in pain? no        Primary Care Provider:  Janith Lima MD  CC:  f/up.  History of Present Illness: She returns for f/up and reports that she has had some left posterior hip pain for several months with no trauma or injury. She wants to have an xray done of the area.  She has worsening numbness/tingling/burning in her feet that she notices at night when she tries to sleep. Getting B12 replacement has not helped her symptoms any.  Preventive Screening-Counseling & Management  Alcohol-Tobacco     Alcohol drinks/day: 0     Smoking Status: never     Smoking Cessation Counseling: yes     Passive Smoke Exposure: yes  Current Medications (verified): 1)  Protonix 40 Mg  Tbec (Pantoprazole Sodium) .... Take One By Mouth Two Times A Day 2)  Hyzaar 50-12.5 Mg Tabs (Losartan Potassium-Hctz) .... One By Mouth Once Daily For High Blood Pressure 3)  Xalatan 0.005 % Soln (Latanoprost) .... One Drop in Both Eyes At Night 4)  Levoxyl 50 Mcg Tabs (Levothyroxine Sodium) .... Once Daily 5)  Multivitamins  Tabs (Multiple Vitamin) .... Take One By Mouth Once Daily 6)  Vitamin D 1000 Unit Tabs (Cholecalciferol) .... Take One By Mouth Once Daily 7)  [place] (Probiotic Product) .... Two Times A Day  Allergies (verified): 1)  ! Sulfa 2)  !  Codeine 3)  Cephalexin (Cephalexin) 4)  Septra (Sulfamethoxazole-Trimethoprim)  Past History:  Past Medical History: Last updated: 11/21/2008 Current Problems:  CHEST PAIN (ICD-786.50) aytpical normal myovue 2009 HYPERTENSION, MILD (ICD-401.1) DYSPNEA (ICD-786.05) HYPERLIPIDEMIA (ICD-272.4) ANEMIA (ICD-285.9) EDEMA (ICD-782.3) DIARRHEA OF PRESUMED INFECTIOUS ORIGIN (ICD-009.3) UTI (ICD-599.0) FATIGUE, ACUTE (ICD-780.79) DYSPNEA ON EXERTION (ICD-786.09) ABDOMINAL PAIN RIGHT UPPER QUADRANT (A999333) DYSMETABOLIC SYNDROME (A999333) UNSPECIFIED SINUSITIS (ICD-473.9) AODM (ICD-250.00) B12 DEFICIENCY (ICD-266.2) HYPOKALEMIA (ICD-276.8) VITAMIN B12 DEFICIENCY (ICD-266.2) BREAST CYST, LEFT (ICD-610.0) APPENDECTOMY, HX OF (ICD-V45.79) CHOLECYSTECTOMY, HX OF (ICD-V45.79) ARTHROSCOPY, RIGHT KNEE, HX OF (ICD-V45.89) TONSILLECTOMY AND ADENOIDECTOMY, HX OF (ICD-V45.79) ANEMIA, IRON DEFICIENCY, CHRONIC (ICD-280.9) FIBROCYSTIC BREAST DISEASE (ICD-610.1) DEGENERATIVE JOINT DISEASE (ICD-715.90) LUNG NODULE (ICD-518.89) HYPERCHOLESTEROLEMIA (ICD-272.0) ACID REFLUX DISEASE (ICD-530.81) HIATAL HERNIA (ICD-553.3) HYPOTHYROIDISM (ICD-244.9) Breast cyst Acid reflux disease with esophageal stricture  Past Surgical History: Last updated: 06/09/2009 BIL. SHOULDER SURGERY BLADDER TACK Total knee replacement Bilateral Hysterectomy part Tonsillectomy Tubal Ligation  Family History: Last updated: 06/09/2009 Family History of Arthritis Family History of Digestive disorder: brother has stomach cancer brother with bladder cancer Family History of Breast Cancer: maternal aunt  Social History: Last updated: 06/09/2009 Retired Married Never Smoked Alcohol use-no Drug use-no Regular exercise-yes Daily Caffeine Use  Risk  Factors: Alcohol Use: 0 (10/28/2009) Exercise: yes (12/21/2007)  Risk Factors: Smoking Status: never (10/28/2009) Passive Smoke Exposure: yes  (10/28/2009)  Family History: Reviewed history from 06/09/2009 and no changes required. Family History of Arthritis Family History of Digestive disorder: brother has stomach cancer brother with bladder cancer Family History of Breast Cancer: maternal aunt  Social History: Reviewed history from 06/09/2009 and no changes required. Retired Married Never Smoked Alcohol use-no Drug use-no Regular exercise-yes Daily Caffeine Use  Review of Systems  The patient denies anorexia, fever, weight loss, weight gain, chest pain, syncope, dyspnea on exertion, peripheral edema, prolonged cough, headaches, hemoptysis, abdominal pain, hematuria, suspicious skin lesions, transient blindness, difficulty walking, depression, enlarged lymph nodes, and angioedema.   MS:  Complains of joint pain, low back pain, and cramps; denies joint redness, joint swelling, loss of strength, mid back pain, muscle aches, muscle, muscle weakness, stiffness, and thoracic pain. Endo:  Denies cold intolerance, excessive hunger, excessive thirst, excessive urination, heat intolerance, polyuria, and weight change. Heme:  Denies abnormal bruising, bleeding, enlarge lymph nodes, fevers, pallor, and skin discoloration.  Physical Exam  General:  alert, well-developed, well-nourished, well-hydrated, good hygiene, and overweight-appearing.   Head:  normocephalic and atraumatic.   Eyes:  vision grossly intact and pupils equal.   Mouth:  Oral mucosa and oropharynx without lesions or exudates.  Teeth in good repair. Neck:  supple, full ROM, no masses, no carotid bruits, and no neck tenderness.   Lungs:  Normal respiratory effort, chest expands symmetrically. Lungs are clear to auscultation, no crackles or wheezes. Heart:  Normal rate and regular rhythm. S1 and S2 normal without gallop, murmur, click, rub or other extra sounds. Abdomen:  Bowel sounds positive,abdomen soft and non-tender without masses, organomegaly or hernias  noted. Msk:  normal ROM, no joint tenderness, no joint swelling, no joint warmth, no redness over joints, no joint deformities, no joint instability, no crepitation, and no muscle atrophy.   Pulses:  R and L carotid,radial,femoral,dorsalis pedis and posterior tibial pulses are full and equal bilaterally Extremities:  1+ left pedal edema and 1+ right pedal edema.   Neurologic:  No cranial nerve deficits noted. Station and gait are normal. Plantar reflexes are down-going bilaterally. DTRs are symmetrical throughout. Sensory, motor and coordinative functions appear intact. Skin:  turgor normal, color normal, no rashes, no suspicious lesions, no ecchymoses, no petechiae, no purpura, no ulcerations, and no edema.   Cervical Nodes:  no anterior cervical adenopathy and no posterior cervical adenopathy.   Axillary Nodes:  no R axillary adenopathy and no L axillary adenopathy.   Inguinal Nodes:  no R inguinal adenopathy and no L inguinal adenopathy.   Psych:  Cognition and judgment appear intact. Alert and cooperative with normal attention span and concentration. No apparent delusions, illusions, hallucinations  Diabetes Management Exam:    Foot Exam (with socks and/or shoes not present):       Sensory-Pinprick/Light touch:          Left medial foot (L-4): normal          Left dorsal foot (L-5): normal          Left lateral foot (S-1): normal          Right medial foot (L-4): normal          Right dorsal foot (L-5): normal          Right lateral foot (S-1): normal       Sensory-Monofilament:  Left foot: normal          Right foot: normal       Inspection:          Left foot: normal          Right foot: normal       Nails:          Left foot: normal          Right foot: normal   Impression & Recommendations:  Problem # 1:  HIP PAIN, LEFT (ICD-719.45) Assessment New  Orders: T-Hip Comp Left Min 2-views (73510TC)  Discussed use of medications, application of heat or cold, and  exercises.   Problem # 2:  PARESTHESIA (ICD-782.0) Assessment: Deteriorated  Orders: Neurology Referral (Neuro)  Problem # 3:  HYPERTENSION, MILD (ICD-401.1) Assessment: Improved  Her updated medication list for this problem includes:    Hyzaar 50-12.5 Mg Tabs (Losartan potassium-hctz) ..... One by mouth once daily for high blood pressure  Orders: Venipuncture [id number]) TLB-Lipid Panel (80061-LIPID) TLB-BMP (Basic Metabolic Panel-BMET) (99991111) TLB-CBC Platelet - w/Differential (85025-CBCD) TLB-Hepatic/Liver Function Pnl (80076-HEPATIC) TLB-TSH (Thyroid Stimulating Hormone) (84443-TSH) TLB-A1C / Hgb A1C (Glycohemoglobin) (83036-A1C) TLB-Udip w/ Micro (81001-URINE) T-Urine Culture (Spectrum Order) [id number])  BP today: 128/70 Prior BP: 150/86 (06/09/2009)  Prior 10 Yr Risk Heart Disease: Not enough information (06/25/2008)  Labs Reviewed: K+: 4.0 (06/09/2009) Creat: : 1.4 (06/09/2009)   Chol: 273 (06/25/2008)   HDL: 39.60 (06/25/2008)   LDL: DEL (12/21/2007)   TG: 204.0 (06/25/2008)  Problem # 4:  ANEMIA (ICD-285.9) Assessment: Unchanged  Problem # 5:  EDEMA (ICD-782.3) Assessment: Unchanged  Her updated medication list for this problem includes:    Hyzaar 50-12.5 Mg Tabs (Losartan potassium-hctz) ..... One by mouth once daily for high blood pressure  Orders: Venipuncture [id number]) TLB-Lipid Panel (80061-LIPID) TLB-BMP (Basic Metabolic Panel-BMET) (99991111) TLB-CBC Platelet - w/Differential (85025-CBCD) TLB-Hepatic/Liver Function Pnl (80076-HEPATIC) TLB-TSH (Thyroid Stimulating Hormone) (84443-TSH) TLB-A1C / Hgb A1C (Glycohemoglobin) (83036-A1C) TLB-Udip w/ Micro (81001-URINE) T-Urine Culture (Spectrum Order) [id number])  Discussed elevation of the legs, use of compression stockings, sodium restiction, and medication use.   Problem # 6:  AODM (ICD-250.00) Assessment: Unchanged  Her updated medication list for this problem includes:    Hyzaar  50-12.5 Mg Tabs (Losartan potassium-hctz) ..... One by mouth once daily for high blood pressure  Orders: Venipuncture [id number]) TLB-Lipid Panel (80061-LIPID) TLB-BMP (Basic Metabolic Panel-BMET) (99991111) TLB-CBC Platelet - w/Differential (85025-CBCD) TLB-Hepatic/Liver Function Pnl (80076-HEPATIC) TLB-TSH (Thyroid Stimulating Hormone) (84443-TSH) TLB-A1C / Hgb A1C (Glycohemoglobin) (83036-A1C) TLB-Udip w/ Micro (81001-URINE) T-Urine Culture (Spectrum Order) [id number]) Ophthalmology Referral (Ophthalmology)  Labs Reviewed: Creat: 1.4 (06/09/2009)     Last Eye Exam: normal (03/03/2008) Reviewed HgBA1c results: 6.4 (09/10/2008)  6.4 (06/25/2008)  Problem # 7:  VITAMIN B12 DEFICIENCY (ICD-266.2) Assessment: Improved  Orders: Venipuncture [id number]) TLB-Lipid Panel (80061-LIPID) TLB-BMP (Basic Metabolic Panel-BMET) (99991111) TLB-CBC Platelet - w/Differential (85025-CBCD) TLB-Hepatic/Liver Function Pnl (80076-HEPATIC) TLB-TSH (Thyroid Stimulating Hormone) (84443-TSH) TLB-A1C / Hgb A1C (Glycohemoglobin) (83036-A1C) TLB-Udip w/ Micro (81001-URINE) T-Urine Culture (Spectrum Order) [id number])  Problem # 8:  HYPERCHOLESTEROLEMIA (ICD-272.0) Assessment: Unchanged  Orders: Venipuncture [id number]) TLB-Lipid Panel (80061-LIPID) TLB-BMP (Basic Metabolic Panel-BMET) (99991111) TLB-CBC Platelet - w/Differential (85025-CBCD) TLB-Hepatic/Liver Function Pnl (80076-HEPATIC) TLB-TSH (Thyroid Stimulating Hormone) (84443-TSH) TLB-A1C / Hgb A1C (Glycohemoglobin) (83036-A1C) TLB-Udip w/ Micro (81001-URINE) T-Urine Culture (Spectrum Order) [id number])  Labs Reviewed: SGOT: 29 (06/09/2009)   SGPT: 25 (06/09/2009)  Lipid Goals: Chol Goal: 200 (01/21/2008)   HDL Goal: 40 (01/21/2008)   LDL Goal: 100 (01/21/2008)   TG  Goal: 150 (01/21/2008)  Prior 10 Yr Risk Heart Disease: Not enough information (06/25/2008)   HDL:39.60 (06/25/2008), 49.6 (12/21/2007)  LDL:DEL (12/21/2007)   Chol:273 (06/25/2008), 177 (12/21/2007)  Trig:204.0 (06/25/2008), 197 (12/21/2007)  Complete Medication List: 1)  Protonix 40 Mg Tbec (Pantoprazole sodium) .... Take one by mouth two times a day 2)  Hyzaar 50-12.5 Mg Tabs (Losartan potassium-hctz) .... One by mouth once daily for high blood pressure 3)  Xalatan 0.005 % Soln (Latanoprost) .... One drop in both eyes at night 4)  Levoxyl 50 Mcg Tabs (Levothyroxine sodium) .... Once daily 5)  Multivitamins Tabs (Multiple vitamin) .... Take one by mouth once daily 6)  Vitamin D 1000 Unit Tabs (Cholecalciferol) .... Take one by mouth once daily 7)  [place] (Probiotic product) .... Two times a day  Other Orders: Zoster (Shingles) Vaccine Live [id number]) Admin 1st Vaccine [phone])  Patient Instructions: 1)  Please schedule a follow-up appointment in 2 months. 2)  Tobacco is very bad for your health and your loved ones! You Should stop smoking!. 3)  Stop Smoking Tips: Choose a Quit date. Cut down before the Quit date. decide what you will do as a substitute when you feel the urge to smoke(gum,toothpick,exercise). 4)  It is important that you exercise regularly at least 20 minutes 5 times a week. If you develop chest pain, have severe difficulty breathing, or feel very tired , stop exercising immediately and seek medical attention. 5)  You need to lose weight. Consider a lower calorie diet and regular exercise.  6)  Check your blood sugars regularly. If your readings are usually above 200 or below 70 you should contact our office. 7)  It is important that your Diabetic A1c level is checked every 3 months. 8)  See your eye doctor yearly to check for diabetic eye damage. 9)  Check your feet each night for sore areas, calluses or signs of infection. 10)  Check your Blood Pressure regularly. If it is above 130/80: you should make an appointment. Prescriptions: LEVOXYL 50 MCG TABS (LEVOTHYROXINE SODIUM) once daily  #30 Tablet x 11    Entered and Authorized by:   Janith Lima MD   Signed by:   Janith Lima MD on 10/28/2009   Method used:   Electronically to        [facility] [phone]* (retail)       [place], [place]  [id number]       Ph: [phone] or [phone]       Fax: [phone]   RxID:   [id number] HYZAAR 50-12.5 MG TABS (LOSARTAN POTASSIUM-HCTZ) One by mouth once daily for high blood pressure  #30 Tablet x 11   Entered and Authorized by:   Janith Lima MD   Signed by:   Janith Lima MD on 10/28/2009   Method used:   Electronically to        [facility] [phone]* (retail)       [place], [place]  [id number]       Ph: [phone] or [phone]       Fax: [phone]   RxID:   [id number]   Preventive Care Screening  Bone Density:    Date:  03/14/2009    Results:  normal std  dev  Mammogram:    Date:  03/14/2009    Results:  normal     Immunizations Administered:  Zostavax # 1:    Vaccine Type: Zostavax    Site: left deltoid    Mfr: Merck    Dose: 0.65    Route: [place]    Given by: Estell Harpin CMA    Exp. Date: 08/21/2010    Lot #: [id number]    VIS given: 12/24/04 given October 28, 2009.

## 2010-04-13 NOTE — Progress Notes (Signed)
Summary: b12 injections    Phone Note Call from Patient Call back at Home Phone [phone]   Caller: Patient Call For: Dr. Sharlett Iles Reason for Call: Talk to Nurse Summary of Call: Wanting to see when her b-12 injection is due and also about her flu vaccine Initial call taken by: Webb Laws,  December 07, 2009 1:45 PM  Follow-up for Phone Call        tried the number listed in the chart and it is the wrong number, The patient should have been getting her b12 injections every month and it looks like she has not had one since april. She is due now and it is ok to give flu shot at the same time.  Follow-up by: Bernita Buffy CMA Deborra Medina),  December 07, 2009 2:38 PM

## 2010-04-13 NOTE — Letter (Signed)
Summary: [facility]   Imported By: Bubba Hales 02/09/2010 12:34:50  _____________________________________________________________________  External Attachment:    Type:   Image     Comment:   External Document

## 2010-04-13 NOTE — Assessment & Plan Note (Signed)
Summary: MONTHLY B12 SHOT...LSW.  Nurse Visit   Medication Administration  Injection # 1:    Medication: Vit B12 1000 mcg    Diagnosis: VITAMIN B12 DEFICIENCY (ICD-266.2)    Route: IM    Site: L deltoid    Exp Date: 09/2011    Lot #: [id number]    Mfr: American Regent    Comments: Pt will return on 12/1 for next injection    Patient tolerated injection without complications    Given by: Abelino Derrick CMA ([place]) (January 12, 2010 11:06 AM)  Orders Added: 1)  Vit B12 1000 mcg [id number]

## 2010-04-13 NOTE — Assessment & Plan Note (Signed)
Summary: MONTHLY B12/SP  Nurse Visit   Allergies: 1)  ! Sulfa 2)  Cephalexin (Cephalexin) 3)  Septra (Sulfamethoxazole-Trimethoprim)  Medication Administration  Injection # 1:    Medication: Vit B12 1000 mcg    Diagnosis: B12 DEFICIENCY (ICD-266.2)    Route: IM    Site: L deltoid    Exp Date: 01/13/2011    Lot #: [id number]    Mfr: American Regent    Patient tolerated injection without complications    Given by: Bernita Buffy CMA Deborra Medina) (May 08, 2009 10:44 AM) patient needs office visit before next injection

## 2010-04-13 NOTE — Letter (Signed)
Summary: Results Follow-up Letter  [facility] [place]   [place], [place] [address]   Phone: [phone]  Fax: [phone]    10/28/2009  [address] [place], [place]  [address]  Dear Ms. Brindisi,   The following are the results of your recent test(s):  Test     Result     Left Hip Xray   arthritis Kidney     mild dysfunction Liver       normal Blood sugars   acceptable Thyroid     normal  _________________________________________________________  Please call for an appointment as directed _________________________________________________________ _________________________________________________________ _________________________________________________________  Sincerely,  Scarlette Calico MD [facility]

## 2010-04-13 NOTE — Assessment & Plan Note (Signed)
Summary: 266.2/monthly b-12 inj  Nurse Visit   Medication Administration  Injection # 1:    Medication: Vit B12 1000 mcg    Diagnosis: VITAMIN B12 DEFICIENCY (ICD-266.2)    Route: IM    Site: L deltoid    Exp Date: 12/2010    Lot #: [id number]    Mfr: American Regent    Comments: Pt will return on 05/08/09 for next injection    Patient tolerated injection without complications    Given by: Abelino Derrick CMA Deborra Medina) (April 10, 2009 10:48 AM)  Orders Added: 1)  Vit B12 1000 mcg [id number]

## 2010-04-13 NOTE — Procedures (Signed)
Summary: Upper Endoscopy  Patient: [name] Note: All result statuses are Final unless otherwise noted.  Tests: (1) Upper Endoscopy (EGD)   EGD Upper Endoscopy       [facility]     [place], [place]  [id number]           ENDOSCOPY PROCEDURE REPORT           PATIENT:  Yesenia, [name]  MR#:  [id number]     BIRTHDATE:  06-03-1929, 47 yrs. old  GENDER:  female           ENDOSCOPIST:  Loralee Pacas. Sharlett Iles, MD, [facility]     Referred by:           PROCEDURE DATE:  06/24/2009     PROCEDURE:  EGD, diagnostic     ASA CLASS:  Class II     INDICATIONS:  GERD, heartburn, nausea and vomiting throat. globus     sensation.peresistant despite gerd rx.negative ENt exam.           MEDICATIONS:   There was residual sedation effect present from     prior procedure., Versed 1 mg IV     TOPICAL ANESTHETIC:           DESCRIPTION OF PROCEDURE:   After the risks benefits and     alternatives of the procedure were thoroughly explained, informed     consent was obtained.  The LB [id number] endoscope was     introduced through the mouth and advanced to the second portion of     the duodenum, limited by retching and gagging.   The instrument     was slowly withdrawn as the mucosa was fully examined.     <<PROCEDUREIMAGES>>           A hiatal hernia was found. LARGE 7-8 CM HH NOTED AND FREE REFLUX.     Retroflexed views revealed a hiatal hernia.    The scope was then     withdrawn from the patient and the procedure completed.           COMPLICATIONS:  None           ENDOSCOPIC IMPRESSION:     1) Hiatal hernia     LARGE HH.PRIOR CAMERON EROSIONS HAVE HEALED.     RECOMMENDATIONS:     REFER FOR FUNDOPLICATION SURGERY.           REPEAT EXAM:  No           ______________________________     Loralee Pacas. Sharlett Iles, MD, [facility] Regal           CC:  Janith Lima, MD, Johnathan Hausen, MD           n.     Lorrin Mais:   Loralee Pacas. Tawania Daponte at 06/24/2009 02:19 PM        Karna Christmas, [id number]  Note: An exclamation mark (!) indicates a result that was not dispersed into the flowsheet. Document Creation Date: 06/24/2009 2:20 PM _______________________________________________________________________  (1) Order result status: Final Collection or observation date-time: 06/24/2009 14:13 Requested date-time:  Receipt date-time:  Reported date-time:  Referring Physician:   Ordering Physician: Verl Blalock [id number]) Specimen Source:  Source: Tawanna Cooler Order Number: [id number] Lab site:

## 2010-04-13 NOTE — Assessment & Plan Note (Signed)
Summary: MONTHLY B12 INJECTION//SP  Nurse Visit   Allergies: 1)  ! Sulfa 2)  ! Codeine 3)  Cephalexin (Cephalexin) 4)  Septra  Medication Administration  Injection # 1:    Medication: Vit B12 1000 mcg    Diagnosis: VITAMIN B12 DEFICIENCY (ICD-266.2)    Route: IM    Site: R deltoid    Exp Date: 12/2011    Lot #: [id number]    Mfr: American Regent    Patient tolerated injection without complications    Given by: Bernita Buffy CMA ([place]) (February 11, 2010 1:46 PM)  Orders Added: 1)  Vit B12 1000 mcg [id number]

## 2010-04-13 NOTE — Letter (Signed)
Summary: [facility] Instructions   Gastroenterology  [place], [place] [address]   Phone: [phone]  Fax: [phone]       Yesenia [name]    10-24-1944    MRN: [id number]        Procedure Day Sudie Grumbling: Wednesday, 06/24/09     Arrival Time: 12:30      Procedure Time: 1:30     Location of Procedure:                    _X _  [place] (4th Floor)                        [place]   Starting 5 days prior to your procedure 06/19/09 do not eat nuts, seeds, popcorn, corn, beans, peas,  salads, or any raw vegetables.  Do not take any fiber supplements (e.g. Metamucil, Citrucel, and Benefiber).  THE DAY BEFORE YOUR PROCEDURE         DATE:  06/23/09   DAY: Tuesday  1.  Drink clear liquids the entire day-NO SOLID FOOD  2.  Do not drink anything colored red or purple.  Avoid juices with pulp.  No orange juice.  3.  Drink at least 64 oz. (8 glasses) of fluid/clear liquids during the day to prevent dehydration and help the prep work efficiently.  CLEAR LIQUIDS INCLUDE: Water Jello Ice Popsicles Tea (sugar ok, no milk/cream) Powdered fruit flavored drinks Coffee (sugar ok, no milk/cream) Gatorade Juice: apple, white grape, white cranberry  Lemonade Clear bullion, consomm, broth Carbonated beverages (any kind) Strained chicken noodle soup Hard Candy                             4.  In the morning, mix first dose of MoviPrep solution:    Empty 1 Pouch A and 1 Pouch B into the disposable container    Add lukewarm drinking water to the top line of the container. Mix to dissolve    Refrigerate (mixed solution should be used within 24 hrs)  5.  Begin drinking the prep at 5:00 p.m. The MoviPrep container is divided by 4 marks.   Every 15 minutes drink the solution down to the next mark (approximately 8 oz) until the full liter is complete.   6.  Follow completed prep with 16 oz of clear liquid of your choice (Nothing  red or purple).  Continue to drink clear liquids until bedtime.  7.  Before going to bed, mix second dose of MoviPrep solution:    Empty 1 Pouch A and 1 Pouch B into the disposable container    Add lukewarm drinking water to the top line of the container. Mix to dissolve    Refrigerate  THE DAY OF YOUR PROCEDURE      DATE: 06/24/09   DAY: Wednesday  Beginning at 8:30 a.m. (5 hours before procedure):         1. Every 15 minutes, drink the solution down to the next mark (approx 8 oz) until the full liter is complete.  2. Follow completed prep with 16 oz. of clear liquid of your choice.    3. You may drink clear liquids until 11:30  (2 HOURS BEFORE PROCEDURE).   MEDICATION INSTRUCTIONS  Unless otherwise instructed, you should take regular prescription medications with a small sip of water   as early as possible  the morning of your procedure.                  OTHER INSTRUCTIONS  You will need a responsible adult at least 75 years of age to accompany you and drive you home.   This person must remain in the waiting room during your procedure.  Wear loose fitting clothing that is easily removed.  Leave jewelry and other valuables at home.  However, you may wish to bring a book to read or  an iPod/MP3 player to listen to music as you wait for your procedure to start.  Remove all body piercing jewelry and leave at home.  Total time from sign-in until discharge is approximately 2-3 hours.  You should go home directly after your procedure and rest.  You can resume normal activities the  day after your procedure.  The day of your procedure you should not:   Drive   Make legal decisions   Operate machinery   Drink alcohol   Return to work  You will receive specific instructions about eating, activities and medications before you leave.    The above instructions have been reviewed and explained to me by   _______________________    I fully understand and  can verbalize these instructions _____________________________ Date _________

## 2010-04-13 NOTE — Letter (Signed)
Summary: Lipid Letter  [facility]   [place], [place] [address]   Phone: [phone]  Fax: [phone]    10/28/2009  Moksha Fraire [address] [place], [place]  [address]  Dear Ms. Matters:  We have carefully reviewed your last lipid profile from 12/21/2007 and the results are noted below with a summary of recommendations for lipid management.    Cholesterol:       259     Goal: <200 WOW   HDL "good" Cholesterol:   41.60     Goal: >40   LDL "bad" Cholesterol:   189     Goal: <100 WOW   Triglycerides:       256.0     Goal: <150 WOW        TLC Diet (Therapeutic Lifestyle Change): Saturated Fats & Transfatty acids should be kept < 7% of total calories ***Reduce Saturated Fats Polyunstaurated Fat can be up to 10% of total calories Monounsaturated Fat Fat can be up to 20% of total calories Total Fat should be no greater than 25-35% of total calories Carbohydrates should be 50-60% of total calories Protein should be approximately 15% of total calories Fiber should be at least 20-30 grams a day ***Increased fiber may help lower LDL Total Cholesterol should be < 200mg /day Consider adding plant stanol/sterols to diet (example: Benacol spread) ***A higher intake of unsaturated fat may reduce Triglycerides and Increase HDL    Adjunctive Measures (may lower LIPIDS and reduce risk of Heart Attack) include: Aerobic Exercise (20-30 minutes 3-4 times a week) Limit Alcohol Consumption Weight Reduction Aspirin 75-81 mg a day by mouth (if not allergic or contraindicated) Dietary Fiber 20-30 grams a day by mouth     Current Medications: 1)    Protonix 40 Mg  Tbec (Pantoprazole sodium) .... Take one by mouth two times a day 2)    Hyzaar 50-12.5 Mg Tabs (Losartan potassium-hctz) .... One by mouth once daily for high blood pressure 3)    Xalatan 0.005 % Soln (Latanoprost) .... One drop in both eyes at night 4)    Levoxyl 50 Mcg Tabs (Levothyroxine sodium) .... Once  daily 5)    Multivitamins  Tabs (Multiple vitamin) .... Take one by mouth once daily 6)    Vitamin D 1000 Unit Tabs (Cholecalciferol) .... Take one by mouth once daily 7)    [place] (Probiotic product) .... Two times a day  If you have any questions, please call. We appreciate being able to work with you.   Sincerely,    [facility] Janith Lima MD

## 2010-04-15 NOTE — Assessment & Plan Note (Signed)
Summary: MONTHLY B12 SHOT/JMS  Nurse Visit   Allergies: 1)  ! Sulfa 2)  ! Codeine 3)  Cephalexin (Cephalexin) 4)  Septra  Medication Administration  Injection # 1:    Medication: Vit B12 1000 mcg    Diagnosis: VITAMIN B12 DEFICIENCY (ICD-266.2)    Route: IM    Site: L deltoid    Exp Date: 12/2011    Lot #: [id number]    Mfr: American Regent    Patient tolerated injection without complications    Given by: Bernita Buffy CMA Deborra Medina) (March 11, 2010 12:08 PM)  Orders Added: 1)  Vit B12 1000 mcg [id number]

## 2010-04-15 NOTE — Assessment & Plan Note (Signed)
Summary: MONTHLY B12 SHOT/JMS  Nurse Visit   Allergies: 1)  ! Sulfa 2)  ! Codeine 3)  Cephalexin (Cephalexin) 4)  Septra  Medication Administration  Injection # 1:    Medication: Vit B12 1000 mcg    Diagnosis: VITAMIN B12 DEFICIENCY (ICD-266.2)    Route: IM    Site: R deltoid    Exp Date: 12/13/2011    Lot #: [id number]    Mfr: [place]    Patient tolerated injection without complications    Given by: Rosanne Sack RN (April 08, 2010 1:21 PM)  Orders Added: 1)  Vit B12 1000 mcg [id number]

## 2010-05-10 ENCOUNTER — Encounter: Payer: Self-pay | Admitting: Gastroenterology

## 2010-05-10 ENCOUNTER — Encounter (INDEPENDENT_AMBULATORY_CARE_PROVIDER_SITE_OTHER): Payer: Medicare Other

## 2010-05-10 DIAGNOSIS — E538 Deficiency of other specified B group vitamins: Secondary | ICD-10-CM

## 2010-05-20 NOTE — Assessment & Plan Note (Signed)
Summary: B12.Marland KitchenDewitt Hoes  Nurse Visit   Allergies: 1)  ! Sulfa 2)  ! Codeine 3)  Cephalexin (Cephalexin) 4)  Septra  Medication Administration  Injection # 1:    Medication: Vit B12 1000 mcg    Diagnosis: VITAMIN B12 DEFICIENCY (ICD-266.2)    Route: IM    Site: L deltoid    Exp Date: 01/2012    Lot #: [id number]    Mfr: American Regent    Comments: pt to schedule next monthly b12 at front desk    Patient tolerated injection without complications    Given by: Christian Mate CMA ([place]) (May 10, 2010 1:40 PM)  Orders Added: 1)  Vit B12 1000 mcg [id number]

## 2010-05-30 LAB — DIFFERENTIAL
Basophils Absolute: 0 10*3/uL (ref 0.0–0.1)
Eosinophils Absolute: 0 10*3/uL (ref 0.0–0.7)
Monocytes Absolute: 0.6 10*3/uL (ref 0.1–1.0)
Neutrophils Relative %: 88 % — ABNORMAL HIGH (ref 43–77)

## 2010-05-30 LAB — CBC
HCT: 34.6 % — ABNORMAL LOW (ref 36.0–46.0)
Platelets: 272 10*3/uL (ref 150–400)
RDW: 13.4 % (ref 11.5–15.5)

## 2010-05-31 LAB — BASIC METABOLIC PANEL
BUN: 19 mg/dL (ref 6–23)
Calcium: 9.3 mg/dL (ref 8.4–10.5)
Creatinine, Ser: 1.53 mg/dL — ABNORMAL HIGH (ref 0.4–1.2)
GFR calc non Af Amer: 33 mL/min — ABNORMAL LOW (ref >60)
Glucose, Bld: 104 mg/dL — ABNORMAL HIGH (ref 70–99)

## 2010-05-31 LAB — CBC
Hemoglobin: 12.8 g/dL (ref 12.0–15.0)
MCHC: 33.2 g/dL (ref 30.0–36.0)
Platelets: 298 10*3/uL (ref 150–400)
RDW: 13.3 % (ref 11.5–15.5)

## 2010-05-31 LAB — SURGICAL PCR SCREEN: Staphylococcus aureus: POSITIVE — AB

## 2010-06-09 ENCOUNTER — Ambulatory Visit (INDEPENDENT_AMBULATORY_CARE_PROVIDER_SITE_OTHER): Payer: Medicare Other | Admitting: Gastroenterology

## 2010-06-09 DIAGNOSIS — E538 Deficiency of other specified B group vitamins: Secondary | ICD-10-CM

## 2010-06-09 MED ORDER — CYANOCOBALAMIN 1000 MCG/ML IJ SOLN
1000.0000 ug | INTRAMUSCULAR | Status: AC
  Administered 2010-06-09: 1000 ug via INTRAMUSCULAR

## 2010-06-09 NOTE — Progress Notes (Signed)
Patient tolerated b12 well

## 2010-06-29 ENCOUNTER — Ambulatory Visit: Payer: Medicare Other | Admitting: Gastroenterology

## 2010-07-08 ENCOUNTER — Ambulatory Visit (INDEPENDENT_AMBULATORY_CARE_PROVIDER_SITE_OTHER): Payer: BC Managed Care – HMO | Admitting: Gastroenterology

## 2010-07-08 ENCOUNTER — Other Ambulatory Visit (INDEPENDENT_AMBULATORY_CARE_PROVIDER_SITE_OTHER): Payer: Medicare Other | Admitting: Gastroenterology

## 2010-07-08 ENCOUNTER — Other Ambulatory Visit (INDEPENDENT_AMBULATORY_CARE_PROVIDER_SITE_OTHER): Payer: Medicare Other

## 2010-07-08 ENCOUNTER — Encounter: Payer: Self-pay | Admitting: Gastroenterology

## 2010-07-08 DIAGNOSIS — E538 Deficiency of other specified B group vitamins: Secondary | ICD-10-CM

## 2010-07-08 DIAGNOSIS — K7689 Other specified diseases of liver: Secondary | ICD-10-CM

## 2010-07-08 DIAGNOSIS — R5381 Other malaise: Secondary | ICD-10-CM

## 2010-07-08 DIAGNOSIS — R5383 Other fatigue: Secondary | ICD-10-CM

## 2010-07-08 DIAGNOSIS — K219 Gastro-esophageal reflux disease without esophagitis: Secondary | ICD-10-CM

## 2010-07-08 DIAGNOSIS — R1011 Right upper quadrant pain: Secondary | ICD-10-CM

## 2010-07-08 DIAGNOSIS — K760 Fatty (change of) liver, not elsewhere classified: Secondary | ICD-10-CM

## 2010-07-08 DIAGNOSIS — R252 Cramp and spasm: Secondary | ICD-10-CM

## 2010-07-08 DIAGNOSIS — E669 Obesity, unspecified: Secondary | ICD-10-CM

## 2010-07-08 LAB — BASIC METABOLIC PANEL
CO2: 25 mEq/L (ref 19–32)
Chloride: 104 mEq/L (ref 96–112)
Sodium: 140 mEq/L (ref 135–145)

## 2010-07-08 LAB — TSH: TSH: 1.44 u[IU]/mL (ref 0.35–5.50)

## 2010-07-08 LAB — FERRITIN: Ferritin: 24.8 ng/mL (ref 10.0–291.0)

## 2010-07-08 LAB — CBC WITH DIFFERENTIAL/PLATELET
Basophils Relative: 0 % (ref 0.0–3.0)
Eosinophils Absolute: 0.1 10*3/uL (ref 0.0–0.7)
HCT: 40.8 % (ref 36.0–46.0)
Lymphs Abs: 1.5 10*3/uL (ref 0.7–4.0)
MCHC: 34 g/dL (ref 30.0–36.0)
MCV: 94.2 fl (ref 78.0–100.0)
Monocytes Absolute: 0.5 10*3/uL (ref 0.1–1.0)
Neutrophils Relative %: 76.7 % (ref 43.0–77.0)
RBC: 4.33 Mil/uL (ref 3.87–5.11)

## 2010-07-08 LAB — HEPATIC FUNCTION PANEL
ALT: 24 U/L (ref 0–35)
AST: 19 U/L (ref 0–37)
Bilirubin, Direct: 0.1 mg/dL (ref 0.0–0.3)
Total Bilirubin: 0.5 mg/dL (ref 0.3–1.2)

## 2010-07-08 LAB — VITAMIN B12: Vitamin B-12: 641 pg/mL (ref 211–911)

## 2010-07-08 LAB — IBC PANEL
Iron: 92 ug/dL (ref 42–145)
Transferrin: 332.7 mg/dL (ref 212.0–360.0)

## 2010-07-08 LAB — FOLATE: Folate: 14.2 ng/mL (ref >5.9)

## 2010-07-08 MED ORDER — SUCRALFATE 1 GM/10ML PO SUSP: ORAL | Status: DC

## 2010-07-08 NOTE — Progress Notes (Signed)
History of Present Illness:  This is a 77 year old Caucasian female who had severe acid reflux problems with a large anal hernia that was repaired recently by Dr. Johnathan Hausen. She continues substernal burning pain and regurgitation despite daily PPI therapy. Recent upper abdominal ultrasound exam was unremarkable except for fatty infiltration of the liver. In the past her liver function tests have been normal. She does complain of some vague discomfort in the right upper quadrant. Additional problems have included obesity, chronic thyroid dysfunction, gouty arthritis, and degenerative arthritis.  I have reviewed this patient's present history, medical and surgical past history, allergies and medications.     ROS: The remainder of the 10 point ROS is negative--she complains of dyspnea with exertion and shortness of breath with exertion but no exertional related chest pain. She does not have a cough, hemoptysis, or other cardiopulmonary complaints. Bowel movements are regular and she denies melena or hematochezia. She does have a severe leg cramps especially at night. She is on a diuretic.     Physical Exam: Obese patient General well developed well nourished patient in no acute distress, appearing their stated age Eyes PERRLA, no icterus, fundoscopic exam per opthamologist Skin no lesions noted Neck supple, no adenopathy, no thyroid enlargement, no tenderness Chest clear to percussion and auscultation Heart no significant murmurs, gallops or rubs noted Abdomen no hepatosplenomegaly masses or tenderness, BS normal. Her liver is palpable in the right upper quadrant with a somewhat tender age. I cannot appreciate splenomegaly, abdominal masses or tenderness. Extremities no acute joint lesions, edema, phlebitis or evidence of cellulitis. Neurologic patient oriented x 3, cranial nerves intact, no focal neurologic deficits noted. Psychological mental status normal and normal affect.  Assessment  and plan: Her atypical chest pain continues despite repair of a prominent hiatal hernia. She does have fatty infiltration of her liver with associated right upper quadrant pain. However liver function test, anemia profile, electrolytes per her leg pain. I have added Carafate suspension to her PPI therapy.  Encounter Diagnoses  Name Primary?  . Vitamin B12 deficiency   . RUQ pain   . Fatty liver   . Fatigue

## 2010-07-08 NOTE — Patient Instructions (Signed)
Please go to the basement today for your labs.   

## 2010-07-09 ENCOUNTER — Other Ambulatory Visit: Payer: Self-pay | Admitting: Gastroenterology

## 2010-07-19 ENCOUNTER — Telehealth: Payer: Self-pay | Admitting: Gastroenterology

## 2010-07-19 NOTE — Telephone Encounter (Signed)
Primary care should review and decide.Marland KitchenMarland Kitchen

## 2010-07-19 NOTE — Telephone Encounter (Signed)
Note and labs forwarded to Dr. Ronnald Ramp. Pt aware, she is advised to call Dr. Ronnald Ramp and make an appt to discuss labs and cramping in her legs and feet.

## 2010-07-19 NOTE — Telephone Encounter (Signed)
Dr Sharlett Iles patient wants lab results. You wrote this "Notes Recorded by Verl Blalock, MD on 07/09/2010 at 8:32 AM Does she need nephrology referral..Marland KitchenThe CrCl is unknown because both a height and weight (above a minimum accepted value) are required for this calculation. 33 %." on her labs is this something I need to do or is it a note to yourself??

## 2010-07-27 NOTE — Assessment & Plan Note (Signed)
[facility] OFFICE NOTE   NAME:Hinch, PAETON BAYES                      MRN:          [id number]  DATE:08/31/2006                            DOB:          09/03/1929    PRIMARY CARE DOCTOR:  Dr. Seward Carol.   Yesenia [name] is doing well with her acid reflux as long as she takes Aciphex 20  mg twice a day. She has a large hiatal hernia with known erosions in her  hiatal hernia, and chronic iron deficiency. I renewed her medication. We  will check repeat CBC and iron studies. I suspect she will need chronic  iron therapy which she is not taking at this time.     Loralee Pacas. Sharlett Iles, MD, [facility], [place]  Electronically Signed    DRP/MedQ  DD: 08/31/2006  DT: 08/31/2006  Job #: [id number]   cc:   Ashby Dawes. Polite, M.D.

## 2010-07-27 NOTE — Assessment & Plan Note (Signed)
[facility]                            CARDIOLOGY OFFICE NOTE   NAME:Yesenia [name], Yesenia [name]                      MRN:          [id number]  DATE:05/13/2008                            DOB:          Jun 28, 1929    Yesenia [name] returns today for followup.  She has had atypical chest pain in  the past.  She had a Myoview study done on April 11, 2007 which was  normal with a good EF.  Her dyspnea seems functional.  She is  significantly overweight.  She had a significant anemia.  This has been  worked up by Dr. Sharlett [name].  She is on B12 and iron.  She continues to  have mild exertional dyspnea.   She was suppose to have an echo after her last visit in regards to her  dyspnea to reassess RV and LV function.  Apparently, this was not done  and needs to be done this visit.   Otherwise, she is not having any more chest pain.  There has been minor  lower extremity edema.  Her review of systems is remarkable for chronic  back pain.  She has had 1 steroid shot, but still has problems.  It  sounds like she may also have some sciatica.  She is currently on the  Levoxyl 25 mics a day.  Her cholesterol medicine is stopped by her  primary care MD.  She is on Benazepril 40 b.i.d., Klor-Con 20 a day,  Benicar HCTZ 40, 12-1/2 fish oil.   PHYSICAL EXAMINATION:  GENERAL:  Exam is remarkable for talkative,  jovial female, in no distress.  She has poor dentition.  VITAL SIGNS:  Her blood pressure is 130/80, pulse 80 and regular,  respiratory rate 14, afebrile.  HEENT:  Unremarkable.  Carotids are normal without bruit.  No  lymphadenopathy, thyromegaly, or JVP elevation.  LUNGS:  Clear.  Good diaphragmatic motion.  No wheezing.  S1 and S2 with  normal heart sounds.  PMI normal.  ABDOMEN:  Benign.  Bowel sounds are positive.  No AAA.  No tenderness.  No bruit.  No hepatosplenomegaly, hepatojugular reflux, or tenderness.  EXTREMITIES:  Distal pulses are intact.  No edema.  NEURO:   Nonfocal.  SKIN:  Warm and dry.  MUSCULOSKELETAL:  No muscular weakness.   EKG shows sinus rhythm with low voltage, otherwise normal.   IMPRESSION:  1. Atypical chest pain resolved, normal Myoview January 2009.  No need      for further workup.  Continue baby aspirin.  2. Anemia.  Follow up Dr. Sharlett [name].  Continue B12 and iron shots.      Apparently, guaiac cards were negative.  3. Dyspnea functional secondary to weight and anemia.  Followup 2-D      echocardiogram, assess right ventricular, and left ventricular      function.  4. Hyperlipidemia.  Looking back the only records I have is an low-      density lipoprotein of 94 in      2008.  Follow up primary care MD, since she had myalgias on statin      therapy,  I would continue to stop this.     Wallis Bamberg. Johnsie Cancel, MD, [facility]  Electronically Signed    PCN/MedQ  DD: 05/13/2008  DT: 05/14/2008  Job #: [id number]

## 2010-07-27 NOTE — Assessment & Plan Note (Signed)
[facility] OFFICE NOTE   NAME:[name], Yesenia BURGESON                      MRN:          [id number]  DATE:05/24/2007                            DOB:          [birth year]    PROCEDURE:  Small bowel capsule endoscopy.   INDICATIONS:  This 77 year old woman with blood loss/iron-deficiency  anemia.  There has been a history of a large hiatal hernia and Cameron's  erosions.  The recent EGD and colonoscopy did not show lesions  responsible for blood loss.   FINDINGS:  This is a completed small bowel capsule endoscopy with a  normal exam of the small bowel.   SUMMARY/RECOMMENDATIONS:  There is no source of blood loss in the small  intestine seen on this exam.   Further plan per Dr. Sharlett Iles.   For full details of the report, please see the computer-generated report  which is filed in the chart.     Gatha Mayer, MD,FACG  Electronically Signed    CEG/MedQ  DD: 05/31/2007  DT: 05/31/2007  Job #: [id number]   cc:   Ashby Dawes. Polite, M.D.  Loralee Pacas. Sharlett Iles, MD, [facility], [place]

## 2010-07-27 NOTE — Assessment & Plan Note (Signed)
[facility] OFFICE NOTE   NAME:Yesenia [name], Yesenia [name]                      MRN:          [id number]  DATE:03/23/2007                            DOB:          1930/01/08    Ms. Yesenia [name] is a 77 year old white female who has a large hiatal hernia  with chronic GI bleeding from her hiatal hernia and Cameron erosions.  She had endoscopy and colonoscopy 2.5 years ago and is supposed to be on  chronic PPI therapy but cannot afford this medication.  She presents  today with chest pain, dysphagia, and odynophagia.  She also has chronic  constipation with gas and bloating, has incomplete rectal emptying.  She  does have a family history of colon carcinoma in her brother.  On review  of her colonoscopy, she has never had colon polyps.  She has had chronic  iron-deficiency anemia and is supposed to be on Tandem which she also  does not take because of financial concerns.   She complains today of atypical chest pain but also shortness of breath  on exertion and some nocturnal dyspnea.  She had a cardiac evaluation  some 10 years ago which included a 2D stress test which was normal.  She  has not had cardiac evaluation since that time.  She suffers from  hyperlipidemia and chronic thyroid dysfunction.   She is on:  1. Levoxyl 25 mcg a day.  2. Crestor 20 mg a day.  3. Amlodipine 10 mg a day.  4. HCTZ 25 mg a day.   The patient denies melena or hematochezia but does have abdominal gas  and bloating but no significant abdominal pain.  Her appetite is good  and her weight has been stable.   Last blood work I have from June 2008, showed a normal hemoglobin of  13.2 but she was iron deficient with an 8% saturation and a serum  ferritin of 7.7.   The patient has no known history of liver disease or gallbladder  dysfunction but has not had an ultrasound done in many years.  She is  status post cholecystectomy and has fibrocystic  disease of her breasts.  Other problems include hypercholesterolemia and degenerative arthritis  and some osteopenia.   PHYSICAL EXAMINATION:  GENERAL:  She is an elderly appearing white  female appearing older than her stated age in no acute distress.  VITAL SIGNS:  She weighs 218 pounds which is her normal weight.  Blood  pressure is 122/62.  Pulse was 96 and regular.  I could not appreciate  stigmata of chronic liver disease or thyromegaly.  LUNGS:  Breath sounds were distant but I could not appreciate any  definite rales or areas of consolidation.  HEART:  Regular rhythm without significant murmurs, gallops, or rubs.  ABDOMEN:  Showed exogenous obesity without definite organomegaly,  masses, or tenderness.  EXTREMITIES:  There was trace peripheral edema with bony thickening of  her knees and some stasis dermatitis noted in the pretibial areas.  RECTAL:  Inspection of rectum was unremarkable as was the rectal exam.  Stool was trace guaiac positive.  MENTAL STATUS:  Clear.   ASSESSMENT:  1. Large hiatal hernia with recurrent gastrointestinal bleeding from      Cameron erosions and her hiatal hernia.  2. Guaiac positive stool, probably secondary to number one.  3. Rule out recurrent iron-deficiency anemia with associated      cardiovascular complaints.  4. Strong family history of colon carcinoma.  5. Chronic thyroid dysfunction.  6. Rule out ischemic cardiovascular disease.  7. History of degenerative arthritis without any history of      nonsteroidal anti-inflammatory abuse.  8. History of fibrocystic breast disease.  9. Previous hysterectomy.   RECOMMENDATIONS:  1. Cardiology referral.  2. Check multiple screening laboratory parameters including CBC and      iron studies.  3. Restart Aciphex 20 mg twice a day 30 minutes before meals along      with standard antireflux maneuvers.  4. Restart Tandem with multivitamin daily.  5. MiraLax 8 ounces  at bedtime.  6. Check  endoscopy and colonoscopy and follow up.  7. Continue other medications per Dr. Delfina Redwood.     Loralee Pacas. Sharlett Iles, MD, [facility], [place]  Electronically Signed    DRP/MedQ  DD: 03/23/2007  DT: 03/23/2007  Job #: [id number]   cc:   Ashby Dawes. Polite, M.D.

## 2010-07-27 NOTE — Assessment & Plan Note (Signed)
[place]                            CARDIOLOGY OFFICE NOTE   NAME:Gluth, DESJA BORO                      MRN:          [id number]  DATE:04/02/2007                            DOB:          October 04, 1929    Ms. Orzech is a 77-year patient referred by Dr. Sharlett Iles.  She has  been having dyspnea and chest pain.  Her dyspnea has been worse since  October.  She is overweight.  She is a previous smoker stopping eight  years ago.  She has been followed in the past for lung nodules.   The patient's dyspnea seems functional.  She is overweight.  She does  not have active wheezing.  There is no history of DVT or PE.   She does not carry a diagnosis of COPD or emphysema.   She does not get resting dyspnea.  She says she just gives out.  She  has not had a cough, pleuritic pain or sputum production.   The patient's dyspnea is now stable, but she feels that it has been  progressive since October.  There is no PND, orthopnea.  She is able to  sleep at night.  She has been under some increased stress.  She has a  brother who has had bladder cancer, and she takes care of him at home  and she has been having some issues with some of her children.   The patient also occasionally gets chest pressure.  Again this sounds  atypical.  It is sometimes related to her anxiety, and it also can be  related to recumbency.  She has a known large hiatal hernia on chronic  H2 blockers.   The pain is not always exertional.  She can sometimes get it when  talking on the phone.   This does not seem progressive.  She has had a normal Myoview in 1993  and 2000, but no testing other than that.   Her Review of Systems is, otherwise, unremarkable.   PAST MEDICAL HISTORY:  Remarkable for hypothyroidism, hiatal hernia,  reflux, hypercholesterolemia on statin therapy, hypertension on  amlodipine, HCTZ.   PREVIOUS SURGERIES:  Bilateral total knee replacements, left rotator  cuff  surgery, status post appendectomy, partial hysterectomy, and  tonsillectomy.   ALLERGIES:  She is allergic to SEPTRA, SULFA, and CEPHALEXIN.   MEDICATIONS:  1. Levoxyl 25 mcg a day.  2. Aciphex 20 b.i.d.  3. Crestor 20 a day.  4. Amlodipine 10 a day.  5. Hydrochlorothiazide 25 a day.   FAMILY HISTORY:  Noncontributory.   The patient is happily married.  Her husband is with her.  She has five  children.  She has a brother and 40 year old girl living with her.  She  takes care of her brother who has a urostomy bag.  She is fairly  sedentary.  She does drive.  She quit smoking eight years ago and does  not drink.   PHYSICAL EXAMINATION:  GENERAL:  Remarkable for an overweight, somewhat  anxious white female in no distress.  VITAL SIGNS:  Weight 218, blood pressure 150/80, pulse 81 and  regular,  respiratory rate 16, afebrile.  HEENT:  Unremarkable.  Carotids normal without bruit, no  lymphadenopathy, thyromegaly, JVP elevation.  LUNGS:  Clear with good diaphragmatic motion.  No wheezing.  S1, S2 with  normal heart sounds.  PMI normal.  ABDOMEN:  Benign.  Bowel sounds positive.  No AAA, no tenderness.  No  hepatosplenomegaly or hepatojugular  reflux.  No bruits.  She is status  post appendectomy and hysterectomy.  Distal pulses are intact with trace  edema.  NEUROLOGICAL:  Nonfocal.  SKIN:  Warm and dry.  No muscular weakness.   EKG is normal.   LABORATORY DATA:  I reviewed lab work from January 9:  Hematocrit 25.2.  Creatinine 1.2.  LFT's were normal.   Ferritin is 4.2 which is markedly reduced, iron 13.   Folate is low at 0.3.  B12 234.   IMPRESSION:  1. Dyspnea.  Actually I doubt that this is cardiac related with a      normal exam and normal EKG.  I doubt that she has pulmonary      hypertension or decreased left ventricular function.  We will check      2-D echocardiogram.  2. Chest pressure again atypical likely related to reflux.  Continue      Aciphex, adenosine  Myoview.  3. Hypertension currently fairly well-controlled.  Continue low salt      diet, amlodipine, and hydrochlorothiazide.  4. Fairly profound anemia.  This needs further workup.  It may be very      well the etiology to her dyspnea.  Particularly given her size      hematocrit of 25 can clearly cause dyspnea.  Follow up with Dr.      Sharlett Iles for EGD and colonoscopy.  5. History of lung nodules.  Needs follow-up CT.  Last CT scan in 2007      showed nodules in excess of 3 mm.  No history of connective tissue      disease or sarcoid.  We will do a pulmonary embolus protocol to      assess her nodules to rule out pulmonary hypertension or pulmonary      embolisms.  6. Hypothyroidism.  She is on a very low dose of Levoxyl.  Follow-up      TSH.  She may need a higher dose.  Again this may be related to her      dyspnea.  7. Hypercholesterolemia.  Continue Crestor 20 a day, lipid and liver      profile in six months.   Overall I think that the patient's dyspnea is unlikely to be related to  her heart.     Wallis Bamberg. Johnsie Cancel, MD, [facility]  Electronically Signed    PCN/MedQ  DD: 04/02/2007  DT: 04/02/2007  Job #: [id number]

## 2010-07-30 NOTE — Discharge Summary (Signed)
NAME:  Yesenia [name], Yesenia [name]                         ACCOUNT NO.:  [id number]   MEDICAL RECORD NO.:  [id number]                   PATIENT TYPE:  INP   LOCATION:  [id number]                                 FACILITY:  [facility]   PHYSICIAN:  Alta Corning, M.D.                DATE OF BIRTH:  Aug 03, 1929   DATE OF ADMISSION:  07/23/2003  DATE OF DISCHARGE:  07/28/2003                                 DISCHARGE SUMMARY   ADMITTING DIAGNOSES:  1. End-stage degenerative joint disease right knee.  2. Hypertension.  3. Hypothyroidism.   DISCHARGE DIAGNOSES:  1. End-stage degenerative joint disease right knee.  2. Hypertension.  3. Hypothyroidism.  4. Urinary tract infection.   PROCEDURES IN THE HOSPITAL:  Right total knee arthroplasty, Dorna Leitz,  M.D., Jul 23, 2003.   BRIEF HISTORY:  Ms. Bockover is a pleasant 77 year old female with a long-  history of right knee pain and swelling.  She hurts with ambulation.  She  has night pain, and basically hurts with every step.  X-rays of her right  knee showed bone-on-bone DJD.  She only got temporary relief with cortisone  injections and the use of antiinflammatory medication.  Because of her  continued problems and radiographic findings. She is felt to be a candidate  for a right total knee arthroplasty, and is admitted for this.   PERTINENT LABORATORY STUDIES:  Her EKG on admission showed normal sinus  rhythm.  Hemoglobin on admission was 13.1, hematocrit 39.5.  On  postoperative day #1, her hemoglobin was 11.1, #2 10.5 and #3 was 9.7.  Indices were within normal limits.  Prothrombin time of admission was 12.9  seconds with INR of 1.0 and PTT of 37.  On the date of discharge, her  prothrombin time was 18.4 seconds with an INR of 1.9.  CMET on admission was  within normal limits.  BMET on postoperative day #1 and #2 were also within  normal limits.  Urinalysis on admission showed 21 to 50 wbc's per high-  powered field, and bacteria that was  too-numerous-to-count.   HOSPITAL COURSE:  The patient was admitted to the hospital on Jul 23, 2003.  Preoperatively, she was noted to have increased wbc's in her urine and was  given 80 mg of gentamicin along with Ancef 1 gram.  This was given about one  hour preoperatively.  We proceeded with right total knee arthroplasty which  was done as well-described in Dr. Berenice Primas' operative note on Jul 23, 2003.  For pain control, IV PCA morphine was used along with continuation of Ancef  one gram IV q.8h. x5 doses.  CPM machine was used for range of motion and a  rehabilitation consultation was obtained.   On postoperative day #1, she had moderate knee pain.  Her vital signs were  stable.  She was afebrile.  Her lungs were clear.  Hemoglobin was 11.1,  hematocrit was 33.4.  Her BMET was within normal limits.  She got out of bed  with physical therapy.   On postoperative day #2, she had moderate knee pain.  She was up in the  chair.  She was taking fluid without difficulty.  Foley catheter had been  removed.  She had a temperature of 99.4.  Hemoglobin was 10.5.  Her INR was  1.3.  Potassium was 5.0.  Her right-knee dressing was changed, and her wound  was benign.  Her calf was soft and nontender.  He Hemovac drains were  pulled.  Her PCA and morphine pump was discontinued.  She continued to make  progress with therapy.  INR was up to 1.8.  She was continued on Coumadin.  On Jul 27, 2003, she had an INR of 1.6.  She had one additional day of  physical therapy and active range of motion of the knee.  On Jul 28, 2003,  she stated she was ready to go home.  She was taking fluids and voiding  without difficulty.  She was doing well on physical therapy, ambulating in  the hall.  Vitals signs are stable.  She is afebrile.  Her right knee would  was benign.  Her INR was 1.9.  She was discharged home in her previous  condition, on a regular diet.  She was given Percocet apparently for pain, 5  mg,  Robaxin 750 mg p.r.n. for spasm, and Coumadin x1 month postoperatively  for DVT prophylaxis.  She will ambulate weightbearing as tolerated on the  right with a walker.  Home Health and physical therapy will be started.  Coumadin management and home use of CPM machine.   FOLLOWUP:  She will followup with Dr. Berenice Primas in two weeks, sooner if any  problems occur.      Gary Fleet, P.A.                       Alta Corning, M.D.    Geralynn Rile  D:  08/27/2003  T:  08/27/2003  Job:  [id number]   cc:   Charlyne Quale, Dr.

## 2010-07-30 NOTE — Op Note (Signed)
[place]. [facility]  Patient:    JAMANDA, HOLGERSON Visit Number: [id number] MRN: [id number]          Service Type: DSU Location: [facility] Attending Physician:  Lowella Petties Dictated by:   Alta Corning, M.D. Proc. Date: 11/24/00 Admit Date:  11/24/2000                             Operative Report  PREOPERATIVE DIAGNOSES: 1. Lipoma, anterior aspect right shoulder. 2. Left shoulder acromioclavicular joint arthritis, impingement, with    partial-thickness rotator cuff tear.  POSTOPERATIVE DIAGNOSES: 1. Lipoma, anterior aspect right shoulder. 2. Left shoulder acromioclavicular joint arthritis, impingement, with    partial-thickness rotator cuff tear.  PROCEDURES: 1. Left shoulder subacromial decompression with debridement of rotator cuff    tear. 2. Distal clavicle excision. 3. Right shoulder mass excision.  SURGEON:  Alta Corning, M.D.  ASSISTANT:  Alvina Filbert. Natividad Brood.  ANESTHESIA:  General.  BRIEF HISTORY:  She is a 77 year old female with a long history of having a mass on the right shoulder.  It was ultimately evaluated by MRI and felt to be a lipoma.  No signs of any kind of invasiveness or malignancy.  Her left shoulder MRI showed that she had partial-thickness rotator cuff tearing, had impingement in the outlet space, and had narrowing of the acromioclavicular joint with impingement.  She was brought to the operating room after failure of conservative care in the left shoulder and we felt that while she was there, she did want to have this mass removed from the right side, and she was scheduled for this.  DESCRIPTION OF PROCEDURE:  Patient brought to the operating room and after an adequate level of anesthesia was obtained with general anesthetic, the patient was placed in the supine position on the operating table, was moved to the beach chair position with all bony prominences well-padded.  The left and right shoulders were prepped  and draped at this point.  Following this, an incision was made over the right shoulder, and tissues were dissected down to the level of the lipoma.  Tissue planes were raised around, and the lipoma was excised en masse.  There was no invasive potential or looking like it was going into any of the surrounding tissues.  At this point the wound was copiously irrigated and suctioned dry.  All bleeding was controlled with electrocautery.  The wound was then closed with a combination of 3-0 Vicryl and 3-0 Maxon suture.  Benzoin and Steri-Strips were applied, and attention was then turned to the left shoulder.  The left shoulder underwent a routine arthroscopic evaluation and noted to have labral tearing in the anterior portion of the glenohumeral joint.  This was debrided with a suction shaver. There was tearing of the rotator cuff within the glenohumeral joint, partial-thickness.  This was debrided with a suction shaver.  Following this, attention was turned out of the glenohumeral joint into a separate subacromial space.  The Arthrocare wand was used to do ablation of the bursal-type tissue as well as the undersurface of the acromion, as the spur was then debrided from the lateral portal as well as from the posterior portal.  Attention was then turned anteriorly, where a distal clavicle excision was undertaken for about 17 mm as measured with a probe.  Following this, a final check was made of the rotator cuff thoroughly from the superior surface and a massive  bursectomy was performed, and once this was undertaken, the patient was noted to be in satisfactory condition.  Her portals were closed with bandages, and a sterile compressive dressing was applied and she was taken to the recovery room, where she was noted to be in satisfactory condition.  Estimated blood loss for all these procedures was none. Dictated by:   Alta Corning, M.D. Attending Physician:  Lowella Petties DD:   11/24/00 TD:  11/24/00 Job: [id number]

## 2010-07-30 NOTE — Op Note (Signed)
NAME:  Yesenia [name], Yesenia [name]                         ACCOUNT NO.:  [id number]   MEDICAL RECORD NO.:  [id number]                   PATIENT TYPE:  INP   LOCATION:  5015                                 FACILITY:  [facility]   PHYSICIAN:  Alta Corning, M.D.                DATE OF BIRTH:  [birth year]   DATE OF PROCEDURE:  07/23/2003  DATE OF DISCHARGE:                                 OPERATIVE REPORT   PREOPERATIVE DIAGNOSIS:  End stage degenerative joint disease, right knee.   POSTOPERATIVE DIAGNOSIS:  End stage degenerative joint disease, right knee.   OPERATION PERFORMED:  Right total knee replacement with [employer] system, a size 3 femur, a size 2.5 cemented keel tibia, a 10 mm  bridging bearing, a 32 mm all poly patella.   SURGEON:  Alta Corning, M.D.   ASSISTANT:  Gary Fleet, P.A.   ANESTHESIA:  General.   INDICATIONS FOR PROCEDURE:  The patient is a 77 year old female with a long  history of having severe bilateral knee pain.  She has been evaluated over  the last couple of years, treated with anti-inflammatory medication,  activity modification and she  failed conservative care and because of this  was brought to the operating room for total knee replacement.  Several  months ago she was brought to the operating room for this and there was some  concern about low blood counts and she had to seek medical clearance to get  the blood count situation straightened out.  She does present at this time  for right total knee replacement.   DESCRIPTION OF PROCEDURE:  The patient was taken to the operating room.  After adequate anesthesia was obtained with general anesthetic, the patient  was placed supine on the operating table.  The right leg was prepped and  draped in the usual sterile fashion.  Following this, a midline incision was  made subcutaneous tissue taken down to the level of the extensor mechanism.  A medial parapatellar arthrotomy was undertaken and the  extensor mechanism  was identified.  After the medial parapatellar arthrotomy was taken, the  medial and lateral meniscus were removed as well as anterior and posterior  cruciates and the proximal tibia was exposed.  The tibia was then cut  perpendicular to its long axis with a 0 degree cutting block.  Following  this, attention was turned to the femoral component where the femoral  component was identified and sized to a size 3.  The distal cut was then  made at a five degree valgus alignment.  Attention was then turned to the  cutting block where a neutral rotation of the cutting block was used to  align the distal femur.  Anterior and posterior cuts were made as well as  chamfers and attention was then turned toward the center box cut which the  guide was used for the center box cut.  At this point attention was turned  to the tibia where the proximal tibia was sized to a 2.5.  A 2.5 mm  prosthesis was chosen and this was placed on the proximal tibia.  Attention  was then turned towards the balancing and during the case, balancing blocks  were used to assess the balancing at 10 mm in flexion, 11 mm in extension  gap, 10 mm in flexion gap.  At this point attention was then turned towards  the final components were put in place.  The trial components had excellent  range of motion was achieved. Attention at this time was turned towards the  patella which was cut down to the level 13.  There was so much erosion on  the patella, we cut to a level of 13 but there was still some significant  area on the lateral patella which was below the level 13 mm.  At this point  what we felt that we needed to do was downsize the patella to allow it to  fit on the decent bone which was at least 12 mm thick so this was  essentially medialized on the good bone.  A 32 mm patella was chosen for  this purpose.  Once the trial patella was put in place the knee was put  through a range of motion.  Excellent range  of motion was achieved at this  point.  No instability.  At this point the trial components were removed.  The knee was lavaged with a pulsatile lavage irrigation system.  The final  components were then cemented into place, size 3 femur, 2.5 mm tibia, 10 mm  bridging bearing and a 32 mm all poly patella.  A medium Hemovac drain was  then placed after all the excess cement was removed and the cement was  allowed to harden.  A 10 mm polyethylene spacer was put in place to allow  the cement to harden at this point.  At the time the cement was allowed to  harden and attention was turned towards the closure.  #1 Vicryl suture was  used for the deep layer closure.  The subcu was closed with 0 and 2-0 Vicryl  and the skin with 4-0 Maxon subcuticular stitch.  Benzoin and Steri-Strips  were applied.  The patient was taken to the recovery room where she was  noted to be in satisfactory condition.  The estimated blood loss for this  procedure was none.                                               Alta Corning, M.D.    Corliss Skains  D:  07/23/2003  T:  07/24/2003  Job:  [id number]

## 2010-07-30 NOTE — Discharge Summary (Signed)
NAME:  Yesenia [name], Yesenia [name]               ACCOUNT NO.:  [id number]   MEDICAL RECORD NO.:  [id number]          PATIENT TYPE:  INP   LOCATION:  5040                         FACILITY:  [facility]   PHYSICIAN:  Alta Corning, M.D.   DATE OF BIRTH:  [birth year]   DATE OF ADMISSION:  12/05/2003  DATE OF DISCHARGE:                                 DISCHARGE SUMMARY   ADMITTING DIAGNOSES:  1.  End-stage degenerative joint disease left knee.  2.  Status post right total knee arthroplasty.  3.  Hypertension.   DISCHARGE DIAGNOSES:  1.  End-stage degenerative joint disease left knee.  2.  Status post right total knee arthroplasty  3.  Hypertension.  4.  Urinary tract infection.   PROCEDURES:  Left total knee arthroplasty, Dorna Leitz, M.D., December 05, 2003.   HISTORY OF PRESENT ILLNESS:  Yesenia [name] is a 77 year old patient well-known  to [place] who has a long history of left knee pain and swelling. She has pain  with ambulation and positive night pain. Standing x-rays of the left knee  showed bone on bone degenerative arthritis. She got only temporary relief  with cortisone injections and conservative management. Because of her  continued symptoms and radiographic findings, she was admitted for a left  total knee arthroplasty. n.   PERTINENT LABORATORY STUDIES:  Postoperative x-rays of the left knee showed  good position and alignment following left total knee replacement.  Hemoglobin on admission was 14.3, hematocrit 42.1. Indices were within  normal limits. On postoperative day #1, her hemoglobin was 10.8, #2 10.7, #3  10.3. Her pro time on admission was 12.7 seconds with an INR of 0.9. PTT was  34. On the date of discharge on Coumadin therapy, her pro time was 20  seconds with an INR of 2.1. CMET on admission was within normal limits with  no abnormalities. Urinalysis on admission showed 21 to 50 wbc's per high-  powered field with bacteria too numerous to count.   HOSPITAL COURSE:  The  patient underwent left total knee arthroplasty as was  described in Dr. Berenice Primas' operative note. She did have some white blood cells  on her urinalysis preoperatively and was given 80 mg of gentamicin IV along  with 1 g of Ancef IV to sterilize her bladder. Left knee surgery went well  as described in Dr. Berenice Primas' operative note. Postoperatively, the patient was  placed on CPM machine, a PCA morphine pump for pain control, and physical  therapy was ordered for walker ambulation and weightbearing as tolerated on  the left. On postoperative day #1, the patient was doing well. Her  hemoglobin was stable. Her INR was 1.1. Her left knee had a Hemovac drain in  place. She had clear lungs and positive bowel sounds. Hypertension was well-  controlled. She was gotten out of bed with physical therapy. On  postoperative day #2, the patient was doing well, had a moderate amount of  pain but was well-controlled, her drain was pulled, and her dressing was  changed. Her wound was within normal limits. Her IV was discontinued and her  Foley catheter that was in place at the time of surgery was discontinued.  She continued to work with physical therapy. She had no complaints of any  kind of bladder problems such as dysuria. She continued to make good  progress with physical therapy and on December 09, 2003, postoperative day  #4, she was doing well. She was ambulating with a walker without difficulty.  She was taking oral pain medications, her vital signs were stable, she was  afebrile, and her left knee wound was benign. She was discharged home in  improved condition.   DISCHARGE DIET:  She was on a regular diet.   DISCHARGE MEDICATIONS:  She will take her usual home medications with the  addition of Mepergan Fortis p.r.n. for pain and Coumadin per pharmacy  protocol x1 month postoperative.   DISCHARGE INSTRUCTIONS:  She will get home health physical therapy and use a  home CPM for range of motion of  the left knee.   FOLLOWUP:  She will follow up with Dr. Berenice Primas in office in 2 weeks, call if  any problems occur.       JB/MEDQ  D:  01/22/2004  T:  01/22/2004  Job:  [id number]   cc:   Harriette Bouillon, M.D.  [place]. [address] [place]  [place] [address]  Fax: [phone]

## 2010-07-30 NOTE — Op Note (Signed)
NAME:  Yesenia [name], Yesenia [name]               ACCOUNT NO.:  [id number]   MEDICAL RECORD NO.:  [id number]          PATIENT TYPE:  INP   LOCATION:  5040                         FACILITY:  [facility]   PHYSICIAN:  Alta Corning, M.D.   DATE OF BIRTH:  [birth year]   DATE OF PROCEDURE:  12/05/2003  DATE OF DISCHARGE:  12/09/2003                                 OPERATIVE REPORT   PREOPERATIVE DIAGNOSIS:  Degenerative joint disease, right knee.   POSTOPERATIVE DIAGNOSIS:  Degenerative joint disease, right knee.   PROCEDURE:  Right total knee arthroplasty.   SURGEON:  Alta Corning, M.D.   ASSISTANT:  Gary Fleet, P.A.   ANESTHESIA:  General.   INDICATIONS FOR PROCEDURE:  This is a 77 year old female with a long history  of having severe degenerative joint disease in bilateral knees.  She  ultimately had been evaluated and failed all conservative care.  Because of  continued complaints of pain, she was taken to the operating room for right  total knee replacement.   DESCRIPTION OF PROCEDURE:  The patient was taken to the operating room after  adequate anesthesia was obtained with general anesthesia, the patient was  placed on the operating table.  The right leg was prepped and draped in the  usual sterile fashion.  Following this, a midline incision was made.  The  subcutaneous tissue was dissected down to the level of the extensor  mechanism.  Medial parapatellar arthrotomy was undertaken and the knee was  exposed.  Medial and lateral meniscectomy were performed as well as anterior  and posterior cruciate excision and fat pad excision.  The tibia was then  cut perpendicular toward a long axis and the femur was then cut  perpendicular to its long axis and the spacer blocks were put in place.  Next, extension gap was appropriate at this point and attention was turned  toward the angle cut on the femur after the femur was sized and the angle  cuts were made.  The box was then cut.  The tibia was  then prepared.  Attention was turned to the patella which was then cut.  Trials were put in  place at this point.  After the tibia had been prepared with the keel.  Excellent range of motion was achieved, excellent stability.  Easy full  range of motion.  At this point, the wound was copiously irrigated and  suctioned dry with pulsatile lavage irrigation.  The prosthesis was then  opened and was identified.  Final components were cemented in place.  Size 3  right femur, size 2.5 cemented keel tray, a 32 mm all polyethylene patella,  and a 10 mm posterior stabilized insert.  The cement was allowed to harden  and the knee was again tested through range of motion.  Excellent range of  motion was achieved.  Two Hemovac drains were placed and the  wound was closed in layers.  A sterile compressive dressing was applied and  then the patient was taken to the recovery room where she was noted to be in  satisfactory condition.  Estimated blood loss  for the procedure was none.       JLG/MEDQ  D:  01/29/2004  T:  01/29/2004  Job:  [id number]

## 2010-09-21 ENCOUNTER — Encounter: Payer: Self-pay | Admitting: *Deleted

## 2010-09-22 ENCOUNTER — Ambulatory Visit (INDEPENDENT_AMBULATORY_CARE_PROVIDER_SITE_OTHER): Payer: Medicare Other | Admitting: Internal Medicine

## 2010-09-22 ENCOUNTER — Other Ambulatory Visit (INDEPENDENT_AMBULATORY_CARE_PROVIDER_SITE_OTHER): Payer: Medicare Other

## 2010-09-22 ENCOUNTER — Encounter: Payer: Self-pay | Admitting: Internal Medicine

## 2010-09-22 VITALS — BP 132/80 | HR 88 | Temp 98.1°F | Resp 16 | Wt 210.0 lb

## 2010-09-22 DIAGNOSIS — K7689 Other specified diseases of liver: Secondary | ICD-10-CM

## 2010-09-22 DIAGNOSIS — R5383 Other fatigue: Secondary | ICD-10-CM

## 2010-09-22 DIAGNOSIS — I10 Essential (primary) hypertension: Secondary | ICD-10-CM

## 2010-09-22 DIAGNOSIS — K760 Fatty (change of) liver, not elsewhere classified: Secondary | ICD-10-CM

## 2010-09-22 DIAGNOSIS — R279 Unspecified lack of coordination: Secondary | ICD-10-CM

## 2010-09-22 DIAGNOSIS — E039 Hypothyroidism, unspecified: Secondary | ICD-10-CM

## 2010-09-22 DIAGNOSIS — D649 Anemia, unspecified: Secondary | ICD-10-CM

## 2010-09-22 DIAGNOSIS — E119 Type 2 diabetes mellitus without complications: Secondary | ICD-10-CM

## 2010-09-22 DIAGNOSIS — R5381 Other malaise: Secondary | ICD-10-CM

## 2010-09-22 DIAGNOSIS — R270 Ataxia, unspecified: Secondary | ICD-10-CM

## 2010-09-22 LAB — COMPREHENSIVE METABOLIC PANEL
BUN: 23 mg/dL (ref 6–23)
CO2: 31 mEq/L (ref 19–32)
Calcium: 9.6 mg/dL (ref 8.4–10.5)
Creatinine, Ser: 1.5 mg/dL — ABNORMAL HIGH (ref 0.4–1.2)
GFR: 35.57 mL/min — ABNORMAL LOW (ref >60.00)
Glucose, Bld: 113 mg/dL — ABNORMAL HIGH (ref 70–99)
Total Bilirubin: 0.6 mg/dL (ref 0.3–1.2)

## 2010-09-22 LAB — CBC WITH DIFFERENTIAL/PLATELET
Basophils Absolute: 0 10*3/uL (ref 0.0–0.1)
Hemoglobin: 14.4 g/dL (ref 12.0–15.0)
Lymphocytes Relative: 25.5 % (ref 12.0–46.0)
Monocytes Relative: 7.6 % (ref 3.0–12.0)
Neutrophils Relative %: 65.5 % (ref 43.0–77.0)
Platelets: 328 10*3/uL (ref 150.0–400.0)
RDW: 14.4 % (ref 11.5–14.6)

## 2010-09-22 LAB — TSH: TSH: 1.47 u[IU]/mL (ref 0.35–5.50)

## 2010-09-22 LAB — HEMOGLOBIN A1C: Hgb A1c MFr Bld: 6.3 % (ref 4.6–6.5)

## 2010-09-22 NOTE — Patient Instructions (Signed)
Diabetes, Type 2 Diabetes is a lasting (chronic) disease. In type 2 diabetes, the pancreas does not make enough insulin (a hormone), and the body does not respond normally to the insulin that is made. This type of diabetes was also previously called adult onset diabetes. About 90% of all those who have diabetes have type 2. It usually occurs after the age of 29 but can occur at any age. CAUSES Unlike type 1 diabetes, which happens because insulin is no longer being made, type 2 diabetes happens because the body is making less insulin and has trouble using the insulin properly. SYMPTOMS  Drinking more than usual.   Urinating more than usual.   Blurred vision.   Dry, itchy skin.   Frequent infection like yeast infections in women.   More tired than usual (fatigue).  TREATMENT  Healthy eating.   Exercise.   Medication, if needed.   Monitoring blood glucose (sugar).   Seeing your caregiver regularly.  HOME CARE INSTRUCTIONS  Check your blood glucose (sugar) at least once daily. More frequent monitoring may be necessary, depending on your medications and on how well your diabetes is controlled. Your caregiver will advise you.   Take your medicine as directed by your caregiver.   Do not smoke.   Make wise food choices. Ask your caregiver for information. Weight loss can improve your diabetes.   Learn about low blood glucose (hypoglycemia) and how to treat it.   Get your eyes checked regularly.   Have a yearly physical exam. Have your blood pressure checked. Get your blood and urine tested.   Wear a pendant or bracelet saying that you have diabetes.   Check your feet every night for sores. Let your caregiver know if you have sores that are not healing.  SEEK MEDICAL CARE IF:  You are having problems keeping your blood glucose at target range.   You feel you might be having problems with your medicines.   You have symptoms of an illness that is not improving after 24  hours.   You have a sore or wound that is not healing.   You notice a change in vision or a new problem with your vision.   You develop a fever of more than 100.5.  Document Released: 02/28/2005 Document Re-Released: 03/22/2009 [facility] Patient Information 2011 [place].Hypothyroidism The thyroid is a large gland located in the lower front of your neck. The thyroid gland helps control metabolism. Metabolism is how your body handles food. It controls metabolism with the hormone thyroxine. When this gland is underactive (hypothyroid), it produces too little hormone.  SYMPTOMS OF HYPOTHYROIDISM  Lethargy (feeling as though you have no energy)   Cold intolerance   Weight gain (in spite of normal food intake)   Dry skin   Coarse hair   Menstrual irregularity (if severe, may lead to infertility)   Slowing of thought processes  Cardiac problems are also caused by insufficient amounts of thyroid hormone. Hypothyroidism in the newborn is cretinism, and is an extreme form. It is important that this form be treated adequately and immediately or it will lead rapidly to retarded physical and mental development. CAUSES OF HYPOTHYROIDISM These include:   Absence or destruction of thyroid tissue.  Goiter due to iodine deficiency.   Goiter due to medications.   Congenital defects (since birth).  Problems with the pituitary. This causes a lack of TSH (thyroid stimulating hormone). This hormone tells the thyroid to turn out more hormone.   DIAGNOSIS To  prove hypothyroidism, your caregiver may do blood tests and ultrasound tests. Sometimes the signs are hidden. It may be necessary for your caregiver to watch this illness with blood tests either before or after diagnosis and treatment. TREATMENT  Low levels of thyroid hormone are increased by using synthetic thyroid hormone. This is a safe, effective treatment. It usually takes about four weeks to gain the full effects of the medication.  After you have the full effect of the medication, it will generally take another four weeks for problems to leave. Your caregiver may start you on low doses. If you have had heart problems the dose may be gradually increased. It is generally not an emergency to get rapidly to normal. HOME CARE INSTRUCTIONS  Take your medications as your caregiver suggests. Let your caregiver know of any medications you are taking or start taking. Your caregiver will help you with dosage schedules.   As your condition improves, your dosage needs may increase. It will be necessary to have continuing blood tests as suggested by your caregiver.   Report all suspected medication side effects to your caregiver.  SEEK MEDICAL CARE IF YOU DEVELOP:  Sweating.  Tremulousness (tremors).   Anxiety.   Rapid weight loss.   Heat intolerance.  Emotional swings.   Diarrhea.   Weakness.   SEEK IMMEDIATE MEDICAL CARE IF: You develop chest pain, an irregular heart beat (palpitations), or a rapid heart beat. MAKE SURE YOU:   Understand these instructions.   Will watch your condition.   Will get help right away if you are not doing well or get worse.  Document Released: 02/28/2005 Document Re-Released: 02/11/2008 [facility] Patient Information 2011 [place].

## 2010-09-22 NOTE — Progress Notes (Signed)
Subjective:    Patient ID: Yesenia [name], female    DOB: Jul 07, 1929, 77 y.o.   MRN: [id number]  Thyroid Problem Presents for follow-up visit. Symptoms include fatigue. Patient reports no anxiety, cold intolerance, constipation, depressed mood, diaphoresis, diarrhea, dry skin, hair loss, heat intolerance, hoarse voice, leg swelling, menstrual problem, nail problem, palpitations, tremors, visual change, weight gain or weight loss.  Hypertension This is a chronic problem. The current episode started more than 1 year ago. The problem has been gradually improving since onset. The problem is controlled. Pertinent negatives include no anxiety, blurred vision, chest pain, headaches, malaise/fatigue, neck pain, orthopnea, palpitations, peripheral edema, PND, shortness of breath or sweats. Past treatments include angiotensin blockers and diuretics. The current treatment provides moderate improvement. Compliance problems include exercise and diet.  Hypertensive end-organ damage includes a thyroid problem.      Review of Systems  Constitutional: Positive for fatigue. Negative for fever, chills, weight loss, weight gain, malaise/fatigue, diaphoresis, activity change, appetite change and unexpected weight change.  HENT: Negative for sore throat, hoarse voice, facial swelling, trouble swallowing, neck pain and voice change.   Eyes: Negative for blurred vision, photophobia and visual disturbance.  Respiratory: Negative for apnea, cough, choking, chest tightness, shortness of breath, wheezing and stridor.   Cardiovascular: Negative for chest pain, palpitations, orthopnea and PND.  Gastrointestinal: Negative for nausea, vomiting, abdominal pain, diarrhea, constipation, blood in stool, abdominal distention and anal bleeding.  Genitourinary: Negative for dysuria, urgency, frequency, hematuria, flank pain, decreased urine volume, enuresis, difficulty urinating, menstrual problem and dyspareunia.  Musculoskeletal:  Positive for back pain (she is seeing Dr. Berenice Primas about her LBP), arthralgias and gait problem. Negative for myalgias and joint swelling.  Skin: Negative for color change, pallor, rash and wound.  Neurological: Positive for dizziness and weakness (weak all over with ataxia for many months). Negative for tremors, seizures, syncope, facial asymmetry, speech difficulty, light-headedness, numbness and headaches.  Hematological: Negative for cold intolerance, heat intolerance and adenopathy. Does not bruise/bleed easily.  Psychiatric/Behavioral: Negative for suicidal ideas, hallucinations, behavioral problems, confusion, sleep disturbance, self-injury, dysphoric mood, decreased concentration and agitation. The patient is not nervous/anxious and is not hyperactive.        Objective:   Physical Exam  Vitals reviewed. Constitutional: She appears well-developed and well-nourished. No distress.  HENT:  Head: Normocephalic and atraumatic.  Right Ear: External ear normal.  Left Ear: External ear normal.  Nose: Nose normal.  Mouth/Throat: Oropharynx is clear and moist. No oropharyngeal exudate.  Eyes: Conjunctivae and EOM are normal. Pupils are equal, round, and reactive to light. Right eye exhibits no discharge. Left eye exhibits no discharge. No scleral icterus.  Neck: Normal range of motion. Neck supple. No JVD present. No tracheal deviation present. No thyromegaly present.  Cardiovascular: Normal rate, regular rhythm, normal heart sounds and intact distal pulses.  Exam reveals no gallop and no friction rub.   No murmur heard. Pulmonary/Chest: Effort normal and breath sounds normal. No stridor. No respiratory distress. She has no wheezes. She has no rales. She exhibits no tenderness.  Abdominal: Soft. Bowel sounds are normal. She exhibits no distension and no mass. There is no tenderness. There is no rebound and no guarding.  Musculoskeletal: Normal range of motion. She exhibits no edema and no  tenderness.  Lymphadenopathy:    She has no cervical adenopathy.  Neurological: She is alert. She has normal strength. She displays no atrophy, no tremor and normal reflexes. No cranial nerve deficit or sensory deficit. She exhibits  normal muscle tone. She displays a negative Romberg sign. She displays no seizure activity. Coordination and gait (her gait is a combination of a wobble with ataxia as well) abnormal.  Reflex Scores:      Tricep reflexes are 1+ on the right side and 1+ on the left side.      Bicep reflexes are 1+ on the right side and 1+ on the left side.      Brachioradialis reflexes are 1+ on the right side and 1+ on the left side.      Patellar reflexes are 1+ on the right side and 1+ on the left side.      Achilles reflexes are 1+ on the right side and 1+ on the left side. Skin: Skin is warm and dry. No rash noted. She is not diaphoretic. No erythema. No pallor.  Psychiatric: She has a normal mood and affect. Her behavior is normal. Judgment and thought content normal.       Lab Results  Component Value Date   WBC 8.8 07/08/2010   HGB 13.9 07/08/2010   HCT 40.8 07/08/2010   PLT 239.0 07/08/2010   CHOL 259* 10/28/2009   TRIG 256.0* 10/28/2009   HDL 41.60 10/28/2009   LDLDIRECT 189.4 10/28/2009   ALT 24 07/08/2010   AST 19 07/08/2010   NA 140 07/08/2010   K 4.3 07/08/2010   CL 104 07/08/2010   CREATININE 1.6* 07/08/2010   BUN 30* 07/08/2010   CO2 25 07/08/2010   TSH 1.44 07/08/2010   HGBA1C 6.7* 10/28/2009   MICROALBUR 8.3* 06/25/2008    Assessment & Plan:

## 2010-09-23 NOTE — Assessment & Plan Note (Signed)
Check LFTs today. 

## 2010-09-23 NOTE — Assessment & Plan Note (Signed)
I will check her A1C level today

## 2010-09-23 NOTE — Assessment & Plan Note (Signed)
I will check an MRI of her brain to look for CVA, mass, atrophy, etc.

## 2010-09-23 NOTE — Assessment & Plan Note (Signed)
Her BP is well controlled and she does not complain of ataxia

## 2010-09-23 NOTE — Assessment & Plan Note (Signed)
I will recheck her TSH today 

## 2010-10-08 ENCOUNTER — Ambulatory Visit (HOSPITAL_COMMUNITY)
Admission: RE | Admit: 2010-10-08 | Discharge: 2010-10-08 | Disposition: A | Discharge status: Home / Self Care | Payer: Medicare Other | Source: Ambulatory Visit | Attending: Internal Medicine | Admitting: Internal Medicine

## 2010-10-08 DIAGNOSIS — R279 Unspecified lack of coordination: Secondary | ICD-10-CM | POA: Insufficient documentation

## 2010-10-08 DIAGNOSIS — R51 Headache: Secondary | ICD-10-CM | POA: Insufficient documentation

## 2010-10-08 DIAGNOSIS — R42 Dizziness and giddiness: Secondary | ICD-10-CM | POA: Insufficient documentation

## 2010-10-08 DIAGNOSIS — I6789 Other cerebrovascular disease: Secondary | ICD-10-CM | POA: Insufficient documentation

## 2010-10-08 DIAGNOSIS — R270 Ataxia, unspecified: Secondary | ICD-10-CM

## 2010-12-07 ENCOUNTER — Other Ambulatory Visit: Payer: Self-pay | Admitting: Internal Medicine

## 2010-12-10 ENCOUNTER — Ambulatory Visit (INDEPENDENT_AMBULATORY_CARE_PROVIDER_SITE_OTHER): Payer: Medicare Other | Admitting: *Deleted

## 2010-12-10 DIAGNOSIS — Z23 Encounter for immunization: Secondary | ICD-10-CM

## 2010-12-12 ENCOUNTER — Other Ambulatory Visit: Payer: Self-pay | Admitting: Internal Medicine

## 2011-01-26 ENCOUNTER — Other Ambulatory Visit (INDEPENDENT_AMBULATORY_CARE_PROVIDER_SITE_OTHER): Payer: Medicare Other

## 2011-01-26 ENCOUNTER — Other Ambulatory Visit: Payer: Self-pay | Admitting: Internal Medicine

## 2011-01-26 ENCOUNTER — Ambulatory Visit (INDEPENDENT_AMBULATORY_CARE_PROVIDER_SITE_OTHER): Payer: Medicare Other | Admitting: Internal Medicine

## 2011-01-26 ENCOUNTER — Encounter: Payer: Self-pay | Admitting: Internal Medicine

## 2011-01-26 DIAGNOSIS — D649 Anemia, unspecified: Secondary | ICD-10-CM

## 2011-01-26 DIAGNOSIS — R10811 Right upper quadrant abdominal tenderness: Secondary | ICD-10-CM

## 2011-01-26 DIAGNOSIS — E119 Type 2 diabetes mellitus without complications: Secondary | ICD-10-CM

## 2011-01-26 DIAGNOSIS — E039 Hypothyroidism, unspecified: Secondary | ICD-10-CM

## 2011-01-26 DIAGNOSIS — I10 Essential (primary) hypertension: Secondary | ICD-10-CM

## 2011-01-26 DIAGNOSIS — E785 Hyperlipidemia, unspecified: Secondary | ICD-10-CM

## 2011-01-26 LAB — COMPREHENSIVE METABOLIC PANEL
Alkaline Phosphatase: 60 U/L (ref 39–117)
BUN: 20 mg/dL (ref 6–23)
GFR: 32.99 mL/min — ABNORMAL LOW (ref >60.00)
Glucose, Bld: 92 mg/dL (ref 70–99)
Total Bilirubin: 0.7 mg/dL (ref 0.3–1.2)

## 2011-01-26 LAB — URINALYSIS, ROUTINE W REFLEX MICROSCOPIC
Ketones, ur: NEGATIVE
Specific Gravity, Urine: 1.01 (ref 1.000–1.030)
Total Protein, Urine: NEGATIVE
Urine Glucose: NEGATIVE
Urobilinogen, UA: 0.2 (ref 0.0–1.0)
pH: 6 (ref 5.0–8.0)

## 2011-01-26 LAB — CBC WITH DIFFERENTIAL/PLATELET
Basophils Relative: 0.6 % (ref 0.0–3.0)
Eosinophils Absolute: 0.2 10*3/uL (ref 0.0–0.7)
HCT: 44.1 % (ref 36.0–46.0)
Hemoglobin: 14.8 g/dL (ref 12.0–15.0)
Lymphs Abs: 2.8 10*3/uL (ref 0.7–4.0)
MCHC: 33.7 g/dL (ref 30.0–36.0)
MCV: 95 fl (ref 78.0–100.0)
Monocytes Absolute: 0.9 10*3/uL (ref 0.1–1.0)
Neutro Abs: 5 10*3/uL (ref 1.4–7.7)
RBC: 4.64 Mil/uL (ref 3.87–5.11)

## 2011-01-26 LAB — SEDIMENTATION RATE: Sed Rate: 16 mm/hr (ref 0–22)

## 2011-01-26 LAB — AMYLASE: Amylase: 73 U/L (ref 27–131)

## 2011-01-26 LAB — LDL CHOLESTEROL, DIRECT: Direct LDL: 180.4 mg/dL

## 2011-01-26 LAB — TSH: TSH: 1.42 u[IU]/mL (ref 0.35–5.50)

## 2011-01-26 LAB — LIPID PANEL: HDL: 40.6 mg/dL (ref >39.00)

## 2011-01-26 NOTE — Assessment & Plan Note (Signed)
Her BP is well controlled 

## 2011-01-26 NOTE — Assessment & Plan Note (Signed)
I will check her FLP today 

## 2011-01-26 NOTE — Patient Instructions (Signed)

## 2011-01-26 NOTE — Assessment & Plan Note (Signed)
I will check her TSH today 

## 2011-01-26 NOTE — Progress Notes (Signed)
Subjective:    Patient ID: Yesenia [name], female    DOB: [birth year], 77 y.o.   MRN: [id number]  Abdominal Pain This is a chronic problem. The current episode started more than 1 year ago. The onset quality is gradual. The problem occurs intermittently. The most recent episode lasted 11 months. The problem has been unchanged. The pain is located in the RUQ. The pain is at a severity of 2/10. The pain is mild. The quality of the pain is sharp. The abdominal pain does not radiate. Pertinent negatives include no anorexia, arthralgias, belching, constipation, diarrhea, dysuria, fever, flatus, frequency, headaches, hematochezia, hematuria, melena, myalgias, nausea, vomiting or weight loss. The pain is aggravated by palpation and movement. The pain is relieved by nothing. She has tried proton pump inhibitors for the symptoms. The treatment provided no relief. Prior diagnostic workup includes ultrasound.      Review of Systems  Constitutional: Negative for fever, chills, weight loss, diaphoresis, activity change, appetite change, fatigue and unexpected weight change.  HENT: Negative.   Eyes: Negative.   Respiratory: Negative for cough, shortness of breath, wheezing and stridor.   Cardiovascular: Negative for chest pain, palpitations and leg swelling.  Gastrointestinal: Positive for abdominal pain. Negative for nausea, vomiting, diarrhea, constipation, blood in stool, melena, hematochezia, abdominal distention, anal bleeding, rectal pain, anorexia and flatus.  Genitourinary: Negative for dysuria, urgency, frequency, hematuria, flank pain, decreased urine volume, vaginal bleeding, vaginal discharge, enuresis, difficulty urinating, vaginal pain and dyspareunia.  Musculoskeletal: Negative for myalgias, back pain, joint swelling, arthralgias and gait problem.  Skin: Negative.   Neurological: Negative for dizziness, tremors, seizures, syncope, facial asymmetry, speech difficulty, weakness, light-headedness,  numbness and headaches.  Hematological: Negative for adenopathy. Does not bruise/bleed easily.  Psychiatric/Behavioral: Negative.        Objective:   Physical Exam  Vitals reviewed. Constitutional: She is oriented to person, place, and time. She appears well-developed and well-nourished. No distress.  HENT:  Head: Normocephalic and atraumatic.  Mouth/Throat: Oropharynx is clear and moist. No oropharyngeal exudate.  Eyes: Conjunctivae are normal. Right eye exhibits no discharge. Left eye exhibits no discharge. No scleral icterus.  Neck: Normal range of motion. Neck supple. No JVD present. No tracheal deviation present. No thyromegaly present.  Cardiovascular: Normal rate, regular rhythm, normal heart sounds and intact distal pulses.  Exam reveals no gallop and no friction rub.   No murmur heard. Pulmonary/Chest: Effort normal and breath sounds normal. No stridor. No respiratory distress. She has no wheezes. She has no rales. She exhibits no tenderness.  Abdominal: Soft. Normal appearance and bowel sounds are normal. She exhibits no shifting dullness, no distension, no pulsatile liver, no fluid wave, no abdominal bruit, no ascites, no pulsatile midline mass and no mass. There is no hepatosplenomegaly. There is tenderness in the right upper quadrant. There is no rigidity, no rebound, no guarding, no CVA tenderness, no tenderness at McBurney's point and negative Murphy's sign. No hernia. Hernia confirmed negative in the ventral area, confirmed negative in the right inguinal area and confirmed negative in the left inguinal area.  Musculoskeletal: Normal range of motion. She exhibits no edema and no tenderness.  Lymphadenopathy:    She has no cervical adenopathy.  Neurological: She is oriented to person, place, and time.  Skin: Skin is warm and dry. No rash noted. She is not diaphoretic. No erythema. No pallor.  Psychiatric: She has a normal mood and affect. Her behavior is normal. Judgment and  thought content normal.  Lab Results  Component Value Date   WBC 9.5 09/22/2010   HGB 14.4 09/22/2010   HCT 41.5 09/22/2010   PLT 328.0 09/22/2010   GLUCOSE 113* 09/22/2010   CHOL 259* 10/28/2009   TRIG 256.0* 10/28/2009   HDL 41.60 10/28/2009   LDLDIRECT 189.4 10/28/2009   ALT 19 09/22/2010   AST 19 09/22/2010   NA 141 09/22/2010   K 3.9 09/22/2010   CL 101 09/22/2010   CREATININE 1.5* 09/22/2010   BUN 23 09/22/2010   CO2 31 09/22/2010   TSH 1.47 09/22/2010   HGBA1C 6.3 09/22/2010   MICROALBUR 8.3* 06/25/2008      Assessment & Plan:

## 2011-01-27 ENCOUNTER — Encounter: Payer: Self-pay | Admitting: Internal Medicine

## 2011-01-27 NOTE — Assessment & Plan Note (Signed)
She has persistent RUQ pain and known fatty liver disease, her pain is out of proportion to her diagnosis so I will investigate a little more with labs and a CT scan as well

## 2011-01-27 NOTE — Assessment & Plan Note (Signed)
I will check her a1c today 

## 2011-01-28 ENCOUNTER — Ambulatory Visit (INDEPENDENT_AMBULATORY_CARE_PROVIDER_SITE_OTHER)
Admission: RE | Admit: 2011-01-28 | Discharge: 2011-01-28 | Disposition: A | Discharge status: Home / Self Care | Payer: Medicare Other | Source: Ambulatory Visit | Attending: Internal Medicine | Admitting: Internal Medicine

## 2011-01-28 DIAGNOSIS — R10811 Right upper quadrant abdominal tenderness: Secondary | ICD-10-CM

## 2011-01-28 MED ORDER — IOHEXOL 300 MG/ML  SOLN
80.0000 mL | Freq: Once | INTRAMUSCULAR | Status: AC | PRN
  Administered 2011-01-28: 80 mL via INTRAVENOUS

## 2011-01-31 ENCOUNTER — Other Ambulatory Visit: Payer: Self-pay | Admitting: Internal Medicine

## 2011-01-31 DIAGNOSIS — K8020 Calculus of gallbladder without cholecystitis without obstruction: Secondary | ICD-10-CM | POA: Insufficient documentation

## 2011-02-09 ENCOUNTER — Encounter (INDEPENDENT_AMBULATORY_CARE_PROVIDER_SITE_OTHER): Payer: Self-pay | Admitting: Surgery

## 2011-02-14 ENCOUNTER — Encounter (INDEPENDENT_AMBULATORY_CARE_PROVIDER_SITE_OTHER): Payer: Self-pay | Admitting: Surgery

## 2011-02-14 ENCOUNTER — Ambulatory Visit (INDEPENDENT_AMBULATORY_CARE_PROVIDER_SITE_OTHER): Payer: Medicare Other | Admitting: Surgery

## 2011-02-14 VITALS — BP 142/96 | HR 60 | Temp 96.8°F | Resp 16 | Ht 63.0 in | Wt 212.4 lb

## 2011-02-14 DIAGNOSIS — K8010 Calculus of gallbladder with chronic cholecystitis without obstruction: Secondary | ICD-10-CM | POA: Insufficient documentation

## 2011-02-14 NOTE — Progress Notes (Signed)
Patient ID: Yesenia [name], female   DOB: 01-26-1930, 77 y.o.   MRN: [id number]  Chief Complaint  Patient presents with  . New Evaluation    eval of GB     HPI Yesenia [name] is a 77 y.o. female. Referred by Scarlette Calico for symptomatic gallstones HPI 77 year old female who is one year status post laparoscopic repair of a hiatal hernia with Nissen fundoplication by Dr. Johnathan Hausen. She presents with at least a two-year history of intermittent right upper quadrant abdominal pain. She underwent an ultrasound in 2010 which was unremarkable. This showed no sign of gallstones or wall thickening. Her symptoms have worsened and become more persistent. She has a lot of postprandial, pain after eating anything spicy. The pain is mostly in the right upper quadrant just below her costal margin. She also has some back pain and abdominal bloating. She also reports frequent postprandial diarrhea. She was seen by Dr. Ronnald Ramp who noted some right upper quadrant tenderness. He sent her for a CT scan which was performed on 11/16. This showed cholelithiasis but no sign of cholecystitis. She is now referred for surgical evaluation. Past Medical History  Diagnosis Date  . Chest pain, unspecified   . Essential hypertension, benign   . Shortness of breath   . Other and unspecified hyperlipidemia   . Anemia, unspecified   . Edema   . Diarrhea of presumed infectious origin   . Urinary tract infection, site not specified   . Other malaise and fatigue   . Snoring disorder   . Abdominal pain, right upper quadrant   . Dysmetabolic syndrome X   . Unspecified sinusitis (chronic)   . Type II or unspecified type diabetes mellitus without mention of complication, not stated as uncontrolled   . Vitamin B12 deficiency   . Hypopotassemia   . Solitary cyst of breast   . Diffuse cystic mastopathy   . Osteoarthrosis, unspecified whether generalized or localized, unspecified site   . Other diseases of lung, not elsewhere  classified   . Esophageal reflux   . Hiatal hernia   . Unspecified hypothyroidism   . Breast cyst   . Esophageal stricture   . Gout, unspecified   . Iron deficiency anemia, unspecified     Past Surgical History  Procedure Date  . Shoulder surgery     bilateral  . Bladder suspension   . Replacement total knee     bilateral  . Partial hysterectomy   . Tonsillectomy and adenoidectomy   . Tubal ligation   . Cholecystectomy   . Joint replacement     Family History  Problem Relation Age of Onset  . Stomach cancer Brother   . Breast cancer Maternal Aunt   . Cancer Brother     bladder  . Stroke Mother   . Heart disease Father     Social History History  Substance Use Topics  . Smoking status: Never Smoker   . Smokeless tobacco: Never Used  . Alcohol Use: No    Allergies  Allergen Reactions  . Cephalexin     REACTION: questionable  . Codeine   . Sulfamethoxazole W/Trimethoprim     REACTION: unspecified  . Sulfonamide Derivatives   . Vicodin (Hydrocodone-Acetaminophen)     Current Outpatient Prescriptions  Medication Sig Dispense Refill  . Cholecalciferol (VITAMIN D-3 PO) Take by mouth daily.        . colchicine 0.6 MG tablet Take 0.6 mg by mouth 2 (two) times daily.        Marland Kitchen  Cyanocobalamin (VITAMIN B-12 PO) Take by mouth daily.        . dorzolamide-timolol (COSOPT) 22.3-6.8 MG/ML ophthalmic solution       . latanoprost (XALATAN) 0.005 % ophthalmic solution Place 1 drop into both eyes at bedtime.        Marland Kitchen levothyroxine (SYNTHROID, LEVOTHROID) 50 MCG tablet TAKE 1 TABLET EVERY DAY  30 tablet  5  . losartan-hydrochlorothiazide (HYZAAR) 50-12.5 MG per tablet TAKE 1 TABLET BY MOUTH EVERY DAY FOR HIGH BLOOD PRESSURE  30 tablet  9  . Multiple Vitamin (MULTIVITAMIN) tablet Take 1 tablet by mouth daily.        . pantoprazole (PROTONIX) 40 MG tablet TAKE 1 TABLET TWICE DAILY  60 tablet  9  . Probiotic Product ([place]) CAPS Take 1 capsule by mouth 2 (two)  times daily.          Review of Systems Review of Systems  Constitutional: Negative for fever, chills and unexpected weight change.  HENT: Positive for hearing loss. Negative for congestion, sore throat, trouble swallowing and voice change.   Eyes: Negative for visual disturbance.  Respiratory: Positive for cough. Negative for wheezing.   Cardiovascular: Negative for chest pain, palpitations and leg swelling.  Gastrointestinal: Positive for nausea, abdominal pain, diarrhea and abdominal distention. Negative for vomiting, constipation, blood in stool and anal bleeding.  Genitourinary: Negative for hematuria, vaginal bleeding and difficulty urinating.  Musculoskeletal: Negative for arthralgias.  Skin: Negative for rash and wound.  Neurological: Negative for seizures, syncope and headaches.  Hematological: Negative for adenopathy. Does not bruise/bleed easily.  Psychiatric/Behavioral: Negative for confusion.    Blood pressure 142/96, pulse 60, temperature 96.8 F (36 C), temperature source Temporal, resp. rate 16, height 5\' 3"  (1.6 m), weight 212 lb 6 oz (96.333 kg).  Physical Exam Physical Exam WDWN in NAD HEENT:  EOMI, sclera anicteric Neck:  No masses, no thyromegaly Lungs:  CTA bilaterally; normal respiratory effort CV:  Regular rate and rhythm; no murmurs Abd:  Obese,+bowel sounds, soft, mild RUQ tenderness, no masses Ext:  Well-perfused; no edema Skin:  Warm, dry; no sign of jaundice   Data Reviewed CT scan abdomen/pelvis 11/16  Assessment    Chronic calculus cholecystitis    Plan    Laparoscopic cholecystectomy with intraoperative cholangiogram.  I discussed the procedure in detail.  The patient was given Neurosurgeon.  We discussed the risks and benefits of a laparoscopic cholecystectomy and possible cholangiogram including, but not limited to bleeding, infection, injury to surrounding structures such as the intestine or liver, bile leak, retained gallstones,  need to convert to an open procedure, prolonged diarrhea, blood clots such as  DVT, common bile duct injury, anesthesia risks, and possible need for additional procedures.  The likelihood of improvement in symptoms and return to the patient's normal status is good. We discussed the typical post-operative recovery course        Yesenia [name] K. 02/14/2011, 3:39 PM

## 2011-02-15 ENCOUNTER — Ambulatory Visit: Payer: Medicare Other | Admitting: Internal Medicine

## 2011-02-22 ENCOUNTER — Telehealth (INDEPENDENT_AMBULATORY_CARE_PROVIDER_SITE_OTHER): Payer: Self-pay | Admitting: General Surgery

## 2011-02-22 NOTE — Telephone Encounter (Signed)
Called pt for a per op ov for [facility] office appt is on 03/22/11 pt sx date on 03/28/11. And a reminder card mailed to pt

## 2011-03-10 ENCOUNTER — Encounter (HOSPITAL_COMMUNITY): Payer: Self-pay | Admitting: Pharmacy Technician

## 2011-03-21 ENCOUNTER — Encounter (HOSPITAL_COMMUNITY): Payer: Self-pay

## 2011-03-21 ENCOUNTER — Ambulatory Visit (HOSPITAL_COMMUNITY)
Admission: RE | Admit: 2011-03-21 | Discharge: 2011-03-21 | Disposition: A | Discharge status: Home / Self Care | Payer: Medicare Other | Source: Ambulatory Visit | Attending: Surgery | Admitting: Surgery

## 2011-03-21 ENCOUNTER — Other Ambulatory Visit: Payer: Self-pay

## 2011-03-21 ENCOUNTER — Encounter (HOSPITAL_COMMUNITY)
Admission: RE | Admit: 2011-03-21 | Discharge: 2011-03-21 | Disposition: A | Discharge status: Home / Self Care | Payer: Medicare Other | Source: Ambulatory Visit | Attending: Surgery | Admitting: Surgery

## 2011-03-21 DIAGNOSIS — Z01812 Encounter for preprocedural laboratory examination: Secondary | ICD-10-CM | POA: Insufficient documentation

## 2011-03-21 DIAGNOSIS — Z01810 Encounter for preprocedural cardiovascular examination: Secondary | ICD-10-CM | POA: Insufficient documentation

## 2011-03-21 DIAGNOSIS — I498 Other specified cardiac arrhythmias: Secondary | ICD-10-CM | POA: Insufficient documentation

## 2011-03-21 DIAGNOSIS — Z01818 Encounter for other preprocedural examination: Secondary | ICD-10-CM | POA: Insufficient documentation

## 2011-03-21 HISTORY — DX: Pneumonia, unspecified organism: J18.9

## 2011-03-21 LAB — BASIC METABOLIC PANEL
BUN: 21 mg/dL (ref 6–23)
Calcium: 9.9 mg/dL (ref 8.4–10.5)
Creatinine, Ser: 1.52 mg/dL — ABNORMAL HIGH (ref 0.50–1.10)
GFR calc Af Amer: 36 mL/min — ABNORMAL LOW (ref >90)
GFR calc non Af Amer: 31 mL/min — ABNORMAL LOW (ref >90)

## 2011-03-21 LAB — CBC
MCHC: 33 g/dL (ref 30.0–36.0)
RDW: 13.5 % (ref 11.5–15.5)

## 2011-03-21 LAB — SURGICAL PCR SCREEN: MRSA, PCR: NEGATIVE

## 2011-03-21 NOTE — Patient Instructions (Addendum)
[facility]  03/21/2011   Your procedure is scheduled on:  Mon. 03/28/2011  Report to [facility] at 0700  AM.  Call this number if you have problems the morning of surgery: [phone]   Remember:   Do not eat food:After Midnight.  May have clear liquids:until Midnight .  Clear liquids include soda, tea, black coffee, apple or grape juice, broth.  Take these medicines the morning of surgery with A SIP OF WATER: Protonix, Levothyroxine, use Cosopt and Xalatan opthalmic solution   Do not wear jewelry, make-up or nail polish.  Do not wear lotions, powders, or perfumes.   Do not shave 48 hours prior to surgery.(women only-shaving legs)  Do not bring valuables to the hospital.  Contacts, dentures or bridgework may not be worn into surgery.  Leave suitcase in the car. After surgery it may be brought to your room.  For patients admitted to the hospital, checkout time is 11:00 AM the day of discharge.   Patients discharged the day of surgery will not be allowed to drive home.  Name and phone number of your driver: [phone], [phone]  Special Instructions: CHG Shower Use Special Wash: 1/2 bottle night before surgery and 1/2 bottle morning of surgery.   Please read over the following fact sheets that you were given: MRSA Information

## 2011-03-22 ENCOUNTER — Encounter (INDEPENDENT_AMBULATORY_CARE_PROVIDER_SITE_OTHER): Payer: Self-pay | Admitting: Surgery

## 2011-03-22 ENCOUNTER — Ambulatory Visit (INDEPENDENT_AMBULATORY_CARE_PROVIDER_SITE_OTHER): Payer: Medicare Other | Admitting: Surgery

## 2011-03-22 VITALS — BP 142/98 | HR 88 | Temp 96.5°F | Ht 62.0 in | Wt 208.6 lb

## 2011-03-22 DIAGNOSIS — K8010 Calculus of gallbladder with chronic cholecystitis without obstruction: Secondary | ICD-10-CM

## 2011-03-22 NOTE — Progress Notes (Signed)
Patient ID: Yesenia [name], female   DOB: [birth year], 78 y.o.   MRN: [id number]  Chief Complaint  Patient presents with  . Pre-op Exam    eval gallbladder    HPI Yesenia [name] is a 78 y.o. female. Referred by Scarlette Calico for symptomatic gallstones HPI 78 year old female who is one year status post laparoscopic repair of a hiatal hernia with Nissen fundoplication by Dr. Johnathan Hausen. She presents with at least a two-year history of intermittent right upper quadrant abdominal pain. She underwent an ultrasound in 2010 which was unremarkable. This showed no sign of gallstones or wall thickening. Her symptoms have worsened and become more persistent. She has a lot of postprandial, pain after eating anything spicy. The pain is mostly in the right upper quadrant just below her costal margin. She also has some back pain and abdominal bloating. She also reports frequent postprandial diarrhea. She was seen by Dr. Ronnald Ramp who noted some right upper quadrant tenderness. He sent her for a CT scan which was performed on 11/16. This showed cholelithiasis but no sign of cholecystitis. She is now referred for surgical evaluation.  Today's visit is to update her H&P.  No significant changes in her symptoms. Past Medical History  Diagnosis Date  . Chest pain, unspecified   . Essential hypertension, benign   . Shortness of breath   . Other and unspecified hyperlipidemia   . Anemia, unspecified   . Edema   . Diarrhea of presumed infectious origin   . Urinary tract infection, site not specified   . Other malaise and fatigue   . Snoring disorder   . Abdominal pain, right upper quadrant   . Dysmetabolic syndrome X   . Unspecified sinusitis (chronic)   . Vitamin B12 deficiency   . Hypopotassemia   . Solitary cyst of breast   . Diffuse cystic mastopathy   . Osteoarthrosis, unspecified whether generalized or localized, unspecified site   . Other diseases of lung, not elsewhere classified   . Esophageal  reflux   . Hiatal hernia   . Unspecified hypothyroidism   . Breast cyst   . Esophageal stricture   . Gout, unspecified   . Iron deficiency anemia, unspecified   . Type II or unspecified type diabetes mellitus without mention of complication, not stated as uncontrolled     pt. states not a diabetic  . Pneumonia 1993    Past Surgical History  Procedure Date  . Shoulder surgery     bilateral  . Bladder suspension   . Replacement total knee     bilateral  . Partial hysterectomy   . Tonsillectomy and adenoidectomy   . Tubal ligation   . Cholecystectomy   . Joint replacement   . Tonsillectomy   . Eye surgery 2011    bilateral cataracts    Family History  Problem Relation Age of Onset  . Stomach cancer Brother   . Breast cancer Maternal Aunt   . Cancer Brother     bladder  . Stroke Mother   . Heart disease Father     Social History History  Substance Use Topics  . Smoking status: Former Smoker -- 0.2 packs/day for 5 years    Types: Cigarettes    Quit date: 03/20/1988  . Smokeless tobacco: Never Used  . Alcohol Use: No    Allergies  Allergen Reactions  . Cephalexin     REACTION: questionable  . Codeine Nausea And Vomiting  . Sulfamethoxazole W/Trimethoprim  REACTION: unspecified  . Sulfonamide Derivatives   . Vicodin (Hydrocodone-Acetaminophen) Other (See Comments)    MAKES HER CRAZY     Current Outpatient Prescriptions  Medication Sig Dispense Refill  . Cholecalciferol (VITAMIN D-3 PO) Take 2,000 Units by mouth daily.       . Cyanocobalamin (VITAMIN B-12 PO) Take 1,000 mcg by mouth daily.       . dorzolamide-timolol (COSOPT) 22.3-6.8 MG/ML ophthalmic solution Place 1 drop into both eyes 2 (two) times daily.       Marland Kitchen latanoprost (XALATAN) 0.005 % ophthalmic solution Place 1 drop into both eyes at bedtime.       Marland Kitchen levothyroxine (SYNTHROID, LEVOTHROID) 50 MCG tablet Take by mouth daily.       Marland Kitchen losartan-hydrochlorothiazide (HYZAAR) 50-12.5 MG per tablet  Take by mouth daily.       . naproxen sodium (ANAPROX) 220 MG tablet Take 110-220 mg by mouth 2 (two) times daily as needed. PAIN       . pantoprazole (PROTONIX) 40 MG tablet Take by mouth daily.         Review of Systems Review of Systems  Constitutional: Negative for fever, chills and unexpected weight change.  HENT: Positive for hearing loss. Negative for congestion, sore throat, trouble swallowing and voice change.   Eyes: Negative for visual disturbance.  Respiratory: Positive for cough. Negative for wheezing.   Cardiovascular: Negative for chest pain, palpitations and leg swelling.  Gastrointestinal: Positive for nausea, abdominal pain, diarrhea and abdominal distention. Negative for vomiting, constipation, blood in stool and anal bleeding.  Genitourinary: Negative for hematuria, vaginal bleeding and difficulty urinating.  Musculoskeletal: Negative for arthralgias.  Skin: Negative for rash and wound.  Neurological: Negative for seizures, syncope and headaches.  Hematological: Negative for adenopathy. Does not bruise/bleed easily.  Psychiatric/Behavioral: Negative for confusion.    Filed Vitals:   03/22/11 0946  BP: 142/98  Pulse: 88  Temp: 96.5 F (35.8 C)    Physical Exam Physical Exam WDWN in NAD HEENT:  EOMI, sclera anicteric Neck:  No masses, no thyromegaly Lungs:  CTA bilaterally; normal respiratory effort CV:  Regular rate and rhythm; no murmurs Abd:  Obese,+bowel sounds, soft, mild RUQ tenderness, no masses Ext:  Well-perfused; no edema Skin:  Warm, dry; no sign of jaundice   Data Reviewed CT scan abdomen/pelvis 11/16  Assessment    Chronic calculus cholecystitis    Plan    Laparoscopic cholecystectomy with intraoperative cholangiogram.  I discussed the procedure in detail.  The patient was given Neurosurgeon.  We discussed the risks and benefits of a laparoscopic cholecystectomy and possible cholangiogram including, but not limited to  bleeding, infection, injury to surrounding structures such as the intestine or liver, bile leak, retained gallstones, need to convert to an open procedure, prolonged diarrhea, blood clots such as  DVT, common bile duct injury, anesthesia risks, and possible need for additional procedures.  The likelihood of improvement in symptoms and return to the patient's normal status is good. We discussed the typical post-operative recovery course   No changes since last visit.  Surgery scheduled for Monday, 03/28/11.       Elmira Olkowski K. 03/22/2011, 10:11 AM

## 2011-03-28 ENCOUNTER — Encounter (HOSPITAL_COMMUNITY): Payer: Self-pay | Admitting: *Deleted

## 2011-03-28 ENCOUNTER — Ambulatory Visit (HOSPITAL_COMMUNITY): Payer: Medicare Other

## 2011-03-28 ENCOUNTER — Encounter (HOSPITAL_COMMUNITY): Payer: Self-pay | Admitting: Anesthesiology

## 2011-03-28 ENCOUNTER — Other Ambulatory Visit (INDEPENDENT_AMBULATORY_CARE_PROVIDER_SITE_OTHER): Payer: Self-pay | Admitting: Surgery

## 2011-03-28 ENCOUNTER — Ambulatory Visit (HOSPITAL_COMMUNITY)
Admission: RE | Admit: 2011-03-28 | Discharge: 2011-03-28 | Disposition: A | Discharge status: Home / Self Care | Payer: Medicare Other | Source: Ambulatory Visit | Attending: Surgery | Admitting: Surgery

## 2011-03-28 ENCOUNTER — Ambulatory Visit (HOSPITAL_COMMUNITY): Payer: Medicare Other | Admitting: Anesthesiology

## 2011-03-28 ENCOUNTER — Encounter (HOSPITAL_COMMUNITY)
Admission: RE | Disposition: A | Discharge status: Home / Self Care | Payer: Self-pay | Source: Ambulatory Visit | Attending: Surgery

## 2011-03-28 DIAGNOSIS — E8881 Metabolic syndrome: Secondary | ICD-10-CM | POA: Insufficient documentation

## 2011-03-28 DIAGNOSIS — I10 Essential (primary) hypertension: Secondary | ICD-10-CM | POA: Insufficient documentation

## 2011-03-28 DIAGNOSIS — E039 Hypothyroidism, unspecified: Secondary | ICD-10-CM | POA: Insufficient documentation

## 2011-03-28 DIAGNOSIS — K219 Gastro-esophageal reflux disease without esophagitis: Secondary | ICD-10-CM | POA: Insufficient documentation

## 2011-03-28 DIAGNOSIS — K8010 Calculus of gallbladder with chronic cholecystitis without obstruction: Secondary | ICD-10-CM | POA: Insufficient documentation

## 2011-03-28 DIAGNOSIS — Z79899 Other long term (current) drug therapy: Secondary | ICD-10-CM | POA: Insufficient documentation

## 2011-03-28 DIAGNOSIS — E785 Hyperlipidemia, unspecified: Secondary | ICD-10-CM | POA: Insufficient documentation

## 2011-03-28 DIAGNOSIS — D649 Anemia, unspecified: Secondary | ICD-10-CM | POA: Insufficient documentation

## 2011-03-28 HISTORY — PX: CHOLECYSTECTOMY: SHX55

## 2011-03-28 SURGERY — LAPAROSCOPIC CHOLECYSTECTOMY WITH INTRAOPERATIVE CHOLANGIOGRAM
Anesthesia: General | Site: Abdomen | Wound class: Clean Contaminated

## 2011-03-28 MED ORDER — ACETAMINOPHEN 10 MG/ML IV SOLN
INTRAVENOUS | Status: AC
  Filled 2011-03-28: qty 100

## 2011-03-28 MED ORDER — HYDROMORPHONE HCL PF 1 MG/ML IJ SOLN
0.2500 mg | INTRAMUSCULAR | Status: DC | PRN
  Administered 2011-03-28 (×3): 0.25 mg via INTRAVENOUS

## 2011-03-28 MED ORDER — LACTATED RINGERS IV SOLN
INTRAVENOUS | Status: DC
  Administered 2011-03-28: 100 mL/h via INTRAVENOUS
  Administered 2011-03-28: 1000 mL via INTRAVENOUS

## 2011-03-28 MED ORDER — PROPOFOL 10 MG/ML IV BOLUS
INTRAVENOUS | Status: DC | PRN
  Administered 2011-03-28: 160 mg via INTRAVENOUS

## 2011-03-28 MED ORDER — HYDROMORPHONE HCL PF 1 MG/ML IJ SOLN
INTRAMUSCULAR | Status: AC
  Filled 2011-03-28: qty 1

## 2011-03-28 MED ORDER — ONDANSETRON HCL 4 MG/2ML IJ SOLN
INTRAMUSCULAR | Status: DC | PRN
  Administered 2011-03-28: 4 mg via INTRAVENOUS

## 2011-03-28 MED ORDER — LACTATED RINGERS IR SOLN
Status: DC | PRN
  Administered 2011-03-28: 1000 mL

## 2011-03-28 MED ORDER — MORPHINE SULFATE 2 MG/ML IJ SOLN: 2.0000 mg | INTRAMUSCULAR | Status: DC | PRN

## 2011-03-28 MED ORDER — LIDOCAINE HCL (CARDIAC) 20 MG/ML IV SOLN
INTRAVENOUS | Status: DC | PRN
  Administered 2011-03-28: 80 mg via INTRAVENOUS

## 2011-03-28 MED ORDER — PROMETHAZINE HCL 25 MG/ML IJ SOLN: 6.2500 mg | INTRAMUSCULAR | Status: DC | PRN

## 2011-03-28 MED ORDER — CIPROFLOXACIN IN D5W 400 MG/200ML IV SOLN
400.0000 mg | INTRAVENOUS | Status: AC
  Administered 2011-03-28: 400 mg via INTRAVENOUS

## 2011-03-28 MED ORDER — IOHEXOL 300 MG/ML  SOLN
INTRAMUSCULAR | Status: AC
  Filled 2011-03-28: qty 1

## 2011-03-28 MED ORDER — GLUCAGON HCL (RDNA) 1 MG IJ SOLR
INTRAMUSCULAR | Status: AC
  Filled 2011-03-28: qty 1

## 2011-03-28 MED ORDER — CIPROFLOXACIN IN D5W 400 MG/200ML IV SOLN
INTRAVENOUS | Status: AC
  Filled 2011-03-28: qty 200

## 2011-03-28 MED ORDER — TRAMADOL HCL 50 MG PO TABS: 50.0000 mg | ORAL_TABLET | Freq: Four times a day (QID) | ORAL | Status: AC | PRN

## 2011-03-28 MED ORDER — GLUCAGON HCL (RDNA) 1 MG IJ SOLR
INTRAMUSCULAR | Status: DC | PRN
  Administered 2011-03-28: 1 mg via INTRAVENOUS

## 2011-03-28 MED ORDER — BUPIVACAINE-EPINEPHRINE 0.25% -1:200000 IJ SOLN
INTRAMUSCULAR | Status: DC | PRN
  Administered 2011-03-28: 11 mL

## 2011-03-28 MED ORDER — GLYCOPYRROLATE 0.2 MG/ML IJ SOLN
INTRAMUSCULAR | Status: DC | PRN
  Administered 2011-03-28: .6 mg via INTRAVENOUS

## 2011-03-28 MED ORDER — IOHEXOL 300 MG/ML  SOLN
INTRAMUSCULAR | Status: DC | PRN
  Administered 2011-03-28: 15 mL

## 2011-03-28 MED ORDER — TRAMADOL HCL 50 MG PO TABS
50.0000 mg | ORAL_TABLET | Freq: Four times a day (QID) | ORAL | Status: DC
  Administered 2011-03-28: 50 mg via ORAL
  Filled 2011-03-28 (×8): qty 1

## 2011-03-28 MED ORDER — EPHEDRINE SULFATE 50 MG/ML IJ SOLN
INTRAMUSCULAR | Status: DC | PRN
  Administered 2011-03-28: 5 mg via INTRAVENOUS

## 2011-03-28 MED ORDER — ACETAMINOPHEN 10 MG/ML IV SOLN
INTRAVENOUS | Status: DC | PRN
  Administered 2011-03-28: 1000 mg via INTRAVENOUS

## 2011-03-28 MED ORDER — ONDANSETRON HCL 4 MG/2ML IJ SOLN: 4.0000 mg | INTRAMUSCULAR | Status: DC | PRN

## 2011-03-28 MED ORDER — BUPIVACAINE-EPINEPHRINE PF 0.25-1:200000 % IJ SOLN
INTRAMUSCULAR | Status: AC
  Filled 2011-03-28: qty 30

## 2011-03-28 MED ORDER — FENTANYL CITRATE 0.05 MG/ML IJ SOLN
INTRAMUSCULAR | Status: DC | PRN
  Administered 2011-03-28 (×3): 50 ug via INTRAVENOUS
  Administered 2011-03-28: 100 ug via INTRAVENOUS

## 2011-03-28 MED ORDER — NEOSTIGMINE METHYLSULFATE 1 MG/ML IJ SOLN
INTRAMUSCULAR | Status: DC | PRN
  Administered 2011-03-28: 4 mg via INTRAVENOUS

## 2011-03-28 MED ORDER — ROCURONIUM BROMIDE 100 MG/10ML IV SOLN
INTRAVENOUS | Status: DC | PRN
  Administered 2011-03-28: 40 mg via INTRAVENOUS

## 2011-03-28 SURGICAL SUPPLY — 37 items
APPLIER CLIP ROT 10 11.4 M/L (STAPLE) ×2
BENZOIN TINCTURE PRP APPL 2/3 (GAUZE/BANDAGES/DRESSINGS) ×2 IMPLANT
CANISTER SUCTION 2500CC (MISCELLANEOUS) ×2 IMPLANT
CHLORAPREP W/TINT 26ML (MISCELLANEOUS) ×2 IMPLANT
CLIP APPLIE ROT 10 11.4 M/L (STAPLE) ×1 IMPLANT
CLOTH BEACON ORANGE TIMEOUT ST (SAFETY) ×2 IMPLANT
COVER MAYO STAND STRL (DRAPES) ×2 IMPLANT
DECANTER SPIKE VIAL GLASS SM (MISCELLANEOUS) IMPLANT
DRAPE C-ARM 42X72 X-RAY (DRAPES) ×2 IMPLANT
DRAPE LAPAROSCOPIC ABDOMINAL (DRAPES) ×2 IMPLANT
DRAPE UTILITY XL STRL (DRAPES) ×2 IMPLANT
DRSG TEGADERM 2-3/8X2-3/4 SM (GAUZE/BANDAGES/DRESSINGS) ×6 IMPLANT
DRSG TEGADERM 4X4.75 (GAUZE/BANDAGES/DRESSINGS) ×2 IMPLANT
ELECT REM PT RETURN 9FT ADLT (ELECTROSURGICAL) ×2
ELECTRODE REM PT RTRN 9FT ADLT (ELECTROSURGICAL) ×1 IMPLANT
FILTER SMOKE EVAC LAPAROSHD (FILTER) IMPLANT
GLOVE BIO SURGEON STRL SZ7 (GLOVE) ×10 IMPLANT
GLOVE BIOGEL PI IND STRL 7.0 (GLOVE) ×1 IMPLANT
GLOVE BIOGEL PI IND STRL 7.5 (GLOVE) ×1 IMPLANT
GLOVE BIOGEL PI INDICATOR 7.0 (GLOVE) ×1
GLOVE BIOGEL PI INDICATOR 7.5 (GLOVE) ×1
GOWN STRL NON-REIN LRG LVL3 (GOWN DISPOSABLE) ×4 IMPLANT
GOWN STRL REIN XL XLG (GOWN DISPOSABLE) ×2 IMPLANT
KIT BASIN OR (CUSTOM PROCEDURE TRAY) ×2 IMPLANT
NS IRRIG 1000ML POUR BTL (IV SOLUTION) ×2 IMPLANT
POUCH SPECIMEN RETRIEVAL 10MM (ENDOMECHANICALS) ×2 IMPLANT
SET CHOLANGIOGRAPH MIX (MISCELLANEOUS) ×2 IMPLANT
SET IRRIG TUBING LAPAROSCOPIC (IRRIGATION / IRRIGATOR) ×2 IMPLANT
SOLUTION ANTI FOG 6CC (MISCELLANEOUS) ×2 IMPLANT
STRIP CLOSURE SKIN 1/2X4 (GAUZE/BANDAGES/DRESSINGS) ×2 IMPLANT
SUT MNCRL AB 4-0 PS2 18 (SUTURE) ×2 IMPLANT
TOWEL OR 17X26 10 PK STRL BLUE (TOWEL DISPOSABLE) ×2 IMPLANT
TRAY LAP CHOLE (CUSTOM PROCEDURE TRAY) ×2 IMPLANT
TROCAR BLADELESS OPT 5 75 (ENDOMECHANICALS) ×4 IMPLANT
TROCAR XCEL BLUNT TIP 100MML (ENDOMECHANICALS) ×2 IMPLANT
TROCAR XCEL NON-BLD 11X100MML (ENDOMECHANICALS) ×2 IMPLANT
TUBING INSUFFLATION 10FT LAP (TUBING) ×2 IMPLANT

## 2011-03-28 NOTE — Anesthesia Preprocedure Evaluation (Signed)
Anesthesia Evaluation  Patient identified by MRN, date of birth, ID band Patient awake    Reviewed: Allergy & Precautions, H&P , NPO status , Patient's Chart, lab work & pertinent test results, reviewed documented beta blocker date and time   Airway Mallampati: II TM Distance: >3 FB Neck ROM: Full    Dental  (+) Partial Upper   Pulmonary neg pulmonary ROS,  clear to auscultation        Cardiovascular hypertension, Pt. on medications Regular Normal Pt denies cardiac symptoms   Neuro/Psych Negative Neurological ROS  Negative Psych ROS   GI/Hepatic negative GI ROS, Neg liver ROS,   Endo/Other  Hypothyroidism Pt denies DM Thyroid replacement Obesity  Renal/GU Cr 1.52  Genitourinary negative   Musculoskeletal negative musculoskeletal ROS (+)   Abdominal   Peds negative pediatric ROS (+)  Hematology negative hematology ROS (+)   Anesthesia Other Findings   Reproductive/Obstetrics negative OB ROS                           Anesthesia Physical Anesthesia Plan  ASA: III  Anesthesia Plan: General   Post-op Pain Management:    Induction: Intravenous  Airway Management Planned: Oral ETT  Additional Equipment:   Intra-op Plan:   Post-operative Plan: Extubation in OR  Informed Consent: I have reviewed the patients History and Physical, chart, labs and discussed the procedure including the risks, benefits and alternatives for the proposed anesthesia with the patient or authorized representative who has indicated his/her understanding and acceptance.     Plan Discussed with: CRNA and Surgeon  Anesthesia Plan Comments:         Anesthesia Quick Evaluation

## 2011-03-28 NOTE — H&P (View-Only) (Signed)
Patient ID: Yesenia [name], female   DOB: 1929/07/13, 78 y.o.   MRN: [id number]  Chief Complaint  Patient presents with  . Pre-op Exam    eval gallbladder    HPI Yesenia [name] is a 78 y.o. female. Referred by Scarlette Calico for symptomatic gallstones HPI 78 year old female who is one year status post laparoscopic repair of a hiatal hernia with Nissen fundoplication by Dr. Johnathan Hausen. She presents with at least a two-year history of intermittent right upper quadrant abdominal pain. She underwent an ultrasound in 2010 which was unremarkable. This showed no sign of gallstones or wall thickening. Her symptoms have worsened and become more persistent. She has a lot of postprandial, pain after eating anything spicy. The pain is mostly in the right upper quadrant just below her costal margin. She also has some back pain and abdominal bloating. She also reports frequent postprandial diarrhea. She was seen by Dr. Ronnald Ramp who noted some right upper quadrant tenderness. He sent her for a CT scan which was performed on 11/16. This showed cholelithiasis but no sign of cholecystitis. She is now referred for surgical evaluation.  Today's visit is to update her H&P.  No significant changes in her symptoms. Past Medical History  Diagnosis Date  . Chest pain, unspecified   . Essential hypertension, benign   . Shortness of breath   . Other and unspecified hyperlipidemia   . Anemia, unspecified   . Edema   . Diarrhea of presumed infectious origin   . Urinary tract infection, site not specified   . Other malaise and fatigue   . Snoring disorder   . Abdominal pain, right upper quadrant   . Dysmetabolic syndrome X   . Unspecified sinusitis (chronic)   . Vitamin B12 deficiency   . Hypopotassemia   . Solitary cyst of breast   . Diffuse cystic mastopathy   . Osteoarthrosis, unspecified whether generalized or localized, unspecified site   . Other diseases of lung, not elsewhere classified   . Esophageal  reflux   . Hiatal hernia   . Unspecified hypothyroidism   . Breast cyst   . Esophageal stricture   . Gout, unspecified   . Iron deficiency anemia, unspecified   . Type II or unspecified type diabetes mellitus without mention of complication, not stated as uncontrolled     pt. states not a diabetic  . Pneumonia 1993    Past Surgical History  Procedure Date  . Shoulder surgery     bilateral  . Bladder suspension   . Replacement total knee     bilateral  . Partial hysterectomy   . Tonsillectomy and adenoidectomy   . Tubal ligation   . Cholecystectomy   . Joint replacement   . Tonsillectomy   . Eye surgery 2011    bilateral cataracts    Family History  Problem Relation Age of Onset  . Stomach cancer Brother   . Breast cancer Maternal Aunt   . Cancer Brother     bladder  . Stroke Mother   . Heart disease Father     Social History History  Substance Use Topics  . Smoking status: Former Smoker -- 0.2 packs/day for 5 years    Types: Cigarettes    Quit date: 03/20/1988  . Smokeless tobacco: Never Used  . Alcohol Use: No    Allergies  Allergen Reactions  . Cephalexin     REACTION: questionable  . Codeine Nausea And Vomiting  . Sulfamethoxazole W/Trimethoprim  REACTION: unspecified  . Sulfonamide Derivatives   . Vicodin (Hydrocodone-Acetaminophen) Other (See Comments)    MAKES HER CRAZY     Current Outpatient Prescriptions  Medication Sig Dispense Refill  . Cholecalciferol (VITAMIN D-3 PO) Take 2,000 Units by mouth daily.       . Cyanocobalamin (VITAMIN B-12 PO) Take 1,000 mcg by mouth daily.       . dorzolamide-timolol (COSOPT) 22.3-6.8 MG/ML ophthalmic solution Place 1 drop into both eyes 2 (two) times daily.       Marland Kitchen latanoprost (XALATAN) 0.005 % ophthalmic solution Place 1 drop into both eyes at bedtime.       Marland Kitchen levothyroxine (SYNTHROID, LEVOTHROID) 50 MCG tablet Take by mouth daily.       Marland Kitchen losartan-hydrochlorothiazide (HYZAAR) 50-12.5 MG per tablet  Take by mouth daily.       . naproxen sodium (ANAPROX) 220 MG tablet Take 110-220 mg by mouth 2 (two) times daily as needed. PAIN       . pantoprazole (PROTONIX) 40 MG tablet Take by mouth daily.         Review of Systems Review of Systems  Constitutional: Negative for fever, chills and unexpected weight change.  HENT: Positive for hearing loss. Negative for congestion, sore throat, trouble swallowing and voice change.   Eyes: Negative for visual disturbance.  Respiratory: Positive for cough. Negative for wheezing.   Cardiovascular: Negative for chest pain, palpitations and leg swelling.  Gastrointestinal: Positive for nausea, abdominal pain, diarrhea and abdominal distention. Negative for vomiting, constipation, blood in stool and anal bleeding.  Genitourinary: Negative for hematuria, vaginal bleeding and difficulty urinating.  Musculoskeletal: Negative for arthralgias.  Skin: Negative for rash and wound.  Neurological: Negative for seizures, syncope and headaches.  Hematological: Negative for adenopathy. Does not bruise/bleed easily.  Psychiatric/Behavioral: Negative for confusion.    Filed Vitals:   03/22/11 0946  BP: 142/98  Pulse: 88  Temp: 96.5 F (35.8 C)    Physical Exam Physical Exam WDWN in NAD HEENT:  EOMI, sclera anicteric Neck:  No masses, no thyromegaly Lungs:  CTA bilaterally; normal respiratory effort CV:  Regular rate and rhythm; no murmurs Abd:  Obese,+bowel sounds, soft, mild RUQ tenderness, no masses Ext:  Well-perfused; no edema Skin:  Warm, dry; no sign of jaundice   Data Reviewed CT scan abdomen/pelvis 11/16  Assessment    Chronic calculus cholecystitis    Plan    Laparoscopic cholecystectomy with intraoperative cholangiogram.  I discussed the procedure in detail.  The patient was given Neurosurgeon.  We discussed the risks and benefits of a laparoscopic cholecystectomy and possible cholangiogram including, but not limited to  bleeding, infection, injury to surrounding structures such as the intestine or liver, bile leak, retained gallstones, need to convert to an open procedure, prolonged diarrhea, blood clots such as  DVT, common bile duct injury, anesthesia risks, and possible need for additional procedures.  The likelihood of improvement in symptoms and return to the patient's normal status is good. We discussed the typical post-operative recovery course   No changes since last visit.  Surgery scheduled for Monday, 03/28/11.       Linda Biehn K. 03/22/2011, 10:11 AM

## 2011-03-28 NOTE — Anesthesia Postprocedure Evaluation (Signed)
  Anesthesia Post-op Note  Patient: Yesenia [name]  Procedure(s) Performed:  LAPAROSCOPIC CHOLECYSTECTOMY WITH INTRAOPERATIVE CHOLANGIOGRAM  Patient Location: PACU  Anesthesia Type: General  Level of Consciousness: oriented and sedated  Airway and Oxygen Therapy: Patient Spontanous Breathing and Patient connected to nasal cannula oxygen  Post-op Pain: none  Post-op Assessment: Post-op Vital signs reviewed, Patient's Cardiovascular Status Stable, Respiratory Function Stable and Patent Airway  Post-op Vital Signs: stable  Complications: No apparent anesthesia complications

## 2011-03-28 NOTE — Transfer of Care (Signed)
Immediate Anesthesia Transfer of Care Note  Patient: Yesenia [name]  Procedure(s) Performed:  LAPAROSCOPIC CHOLECYSTECTOMY WITH INTRAOPERATIVE CHOLANGIOGRAM  Patient Location: PACU  Anesthesia Type: General  Level of Consciousness: awake and alert   Airway & Oxygen Therapy: Patient Spontanous Breathing and Patient connected to face mask oxygen  Post-op Assessment: Report given to PACU RN and Post -op Vital signs reviewed and stable  Post vital signs: Reviewed and stable Filed Vitals:   03/28/11 0642  BP: 161/88  Pulse: 73  Temp: 36.1 C  Resp: 22    Complications: No apparent anesthesia complications

## 2011-03-28 NOTE — Op Note (Signed)
Laparoscopic Cholecystectomy with IOC Procedure Note  Indications: This patient presents with symptomatic gallbladder disease and will undergo laparoscopic cholecystectomy.  Pre-operative Diagnosis: Calculus of gallbladder with other cholecystitis, without mention of obstruction  Post-operative Diagnosis: Same  Surgeon: Quintin Hjort K.   Assistants: none   Anesthesia: General endotracheal anesthesia  ASA Class: 3  Procedure Details  The patient was seen again in the Holding Room. The risks, benefits, complications, treatment options, and expected outcomes were discussed with the patient. The possibilities of reaction to medication, pulmonary aspiration, perforation of viscus, bleeding, recurrent infection, finding a normal gallbladder, the need for additional procedures, failure to diagnose a condition, the possible need to convert to an open procedure, and creating a complication requiring transfusion or operation were discussed with the patient. The likelihood of improving the patient's symptoms with return to their baseline status is good.  The patient and/or family concurred with the proposed plan, giving informed consent. The site of surgery properly noted. The patient was taken to Operating Room, identified as Yesenia [name] and the procedure verified as Laparoscopic Cholecystectomy with Intraoperative Cholangiogram. A Time Out was held and the above information confirmed.  Prior to the induction of general anesthesia, antibiotic prophylaxis was administered. General endotracheal anesthesia was then administered and tolerated well. After the induction, the abdomen was prepped with Chloraprep and draped in the sterile fashion. The patient was positioned in the supine position.  Local anesthetic agent was injected into the skin near the umbilicus and an incision made. We dissected down to the abdominal fascia with blunt dissection.  The fascia was incised vertically and we entered the  peritoneal cavity bluntly.  A pursestring suture of 0-Vicryl was placed around the fascial opening.  The Hasson cannula was inserted and secured with the stay suture.  Pneumoperitoneum was then created with CO2 and tolerated well without any adverse changes in the patient's vital signs. An 11-mm port was placed in the subxiphoid position.  Two 5-mm ports were placed in the right upper quadrant. All skin incisions were infiltrated with a local anesthetic agent before making the incision and placing the trocars.   We positioned the patient in reverse Trendelenburg, tilted slightly to the patient's left.  The gallbladder was identified, the fundus grasped and retracted cephalad. Adhesions were lysed bluntly and with the electrocautery where indicated, taking care not to injure any adjacent organs or viscus. The infundibulum was grasped and retracted laterally, exposing the peritoneum overlying the triangle of Calot. This was then divided and exposed in a blunt fashion. A critical view of the cystic duct and cystic artery was obtained.  The cystic duct was clearly identified and bluntly dissected circumferentially. The cystic duct was ligated with a clip distally.   An incision was made in the cystic duct and the [facility] cholangiogram catheter introduced. The catheter was secured using a clip. A cholangiogram was then obtained which showed good visualization of the distal and proximal biliary tree.  However, contrast did not flow into the duodenum.  There seemed to be some small filling defects in the distal common bile duct.  The patient was given 1 amp of Glucagon and we waited for three minutes.  The cholangiogram was repeated and showed that contrast flowed easily into the duodenum.  No further filling defects were noted. The catheter was then removed.   The cystic duct was then ligated with clips and divided. The cystic artery was identified, dissected free, ligated with clips and divided as well.   The  gallbladder was dissected from the liver bed in retrograde fashion with the electrocautery. The gallbladder was removed and placed in an Endocatch sac. The liver bed was irrigated and inspected. Hemostasis was achieved with the electrocautery. Copious irrigation was utilized and was repeatedly aspirated until clear.  The gallbladder and Endocatch sac were then removed through the umbilical port site.  The pursestring suture was used to close the umbilical fascia.    We again inspected the right upper quadrant for hemostasis.  Pneumoperitoneum was released as we removed the trocars.  4-0 Monocryl was used to close the skin.   Benzoin, steri-strips, and clean dressings were applied. The patient was then extubated and brought to the recovery room in stable condition. Instrument, sponge, and needle counts were correct at closure and at the conclusion of the case.   Findings: Cholecystitis with Cholelithiasis  Estimated Blood Loss: Minimal         Drains: none         Specimens: Gallbladder           Complications: None; patient tolerated the procedure well.         Disposition: PACU - hemodynamically stable.         Condition: stable

## 2011-03-28 NOTE — Interval H&P Note (Signed)
History and Physical Interval Note:  03/28/2011 8:28 AM  Yesenia [name]  has presented today for surgery, with the diagnosis of gall bladder   The various methods of treatment have been discussed with the patient and family. After consideration of risks, benefits and other options for treatment, the patient has consented to  Procedure(s): LAPAROSCOPIC CHOLECYSTECTOMY WITH INTRAOPERATIVE CHOLANGIOGRAM as a surgical intervention .  The patients' history has been reviewed, patient examined, no change in status, stable for surgery.  I have reviewed the patients' chart and labs.  Questions were answered to the patient's satisfaction.     Zema Lizardo K.

## 2011-03-30 ENCOUNTER — Encounter (HOSPITAL_COMMUNITY): Payer: Self-pay | Admitting: Surgery

## 2011-04-12 ENCOUNTER — Ambulatory Visit (INDEPENDENT_AMBULATORY_CARE_PROVIDER_SITE_OTHER): Payer: Medicare Other | Admitting: Surgery

## 2011-04-12 ENCOUNTER — Encounter (INDEPENDENT_AMBULATORY_CARE_PROVIDER_SITE_OTHER): Payer: Self-pay | Admitting: Surgery

## 2011-04-12 VITALS — BP 142/86 | HR 68 | Temp 97.8°F | Resp 16 | Ht 62.0 in | Wt 205.0 lb

## 2011-04-12 DIAGNOSIS — K8010 Calculus of gallbladder with chronic cholecystitis without obstruction: Secondary | ICD-10-CM

## 2011-04-12 NOTE — Progress Notes (Signed)
This patient is status post laparoscopic cholecystectomy with intraoperative cholangiogram.  The pathology report showed chronic cholecystitis.  The patient reports that the post-operative pain has resolved.  They have resumed a regular diet without problems and are having regular bowel movements.  On physical examination, all of the incisions are healing well with no signs of infection or bleeding.  Steri-strips have all been removed.  Impression:  The patient is doing well after laparoscopic cholecystectomy for chronic cholecystitis.  Plan:  The patient may resume full activity and regular diet.  They may follow-up with [place] on a PRN basis.    Imogene Burn. Georgette Dover, MD, [facility]  04/12/2011 1:57 PM

## 2011-06-07 ENCOUNTER — Other Ambulatory Visit: Payer: Self-pay | Admitting: Internal Medicine

## 2011-06-22 LAB — HM MAMMOGRAPHY: HM Mammogram: NORMAL

## 2011-07-06 ENCOUNTER — Encounter: Payer: Self-pay | Admitting: Internal Medicine

## 2011-07-18 ENCOUNTER — Other Ambulatory Visit: Payer: Self-pay | Admitting: Gastroenterology

## 2011-08-18 ENCOUNTER — Ambulatory Visit: Payer: Medicare Other | Admitting: Internal Medicine

## 2011-08-22 ENCOUNTER — Other Ambulatory Visit: Payer: Self-pay | Admitting: Gastroenterology

## 2011-08-25 ENCOUNTER — Other Ambulatory Visit (INDEPENDENT_AMBULATORY_CARE_PROVIDER_SITE_OTHER): Payer: Medicare Other

## 2011-08-25 ENCOUNTER — Encounter: Payer: Self-pay | Admitting: Internal Medicine

## 2011-08-25 ENCOUNTER — Ambulatory Visit (INDEPENDENT_AMBULATORY_CARE_PROVIDER_SITE_OTHER): Payer: Medicare Other | Admitting: Internal Medicine

## 2011-08-25 VITALS — BP 114/70 | HR 66 | Temp 98.1°F | Resp 16 | Wt 204.0 lb

## 2011-08-25 DIAGNOSIS — E538 Deficiency of other specified B group vitamins: Secondary | ICD-10-CM

## 2011-08-25 DIAGNOSIS — M109 Gout, unspecified: Secondary | ICD-10-CM

## 2011-08-25 DIAGNOSIS — E785 Hyperlipidemia, unspecified: Secondary | ICD-10-CM

## 2011-08-25 DIAGNOSIS — I10 Essential (primary) hypertension: Secondary | ICD-10-CM

## 2011-08-25 DIAGNOSIS — E039 Hypothyroidism, unspecified: Secondary | ICD-10-CM

## 2011-08-25 DIAGNOSIS — E119 Type 2 diabetes mellitus without complications: Secondary | ICD-10-CM

## 2011-08-25 DIAGNOSIS — D649 Anemia, unspecified: Secondary | ICD-10-CM

## 2011-08-25 DIAGNOSIS — R197 Diarrhea, unspecified: Secondary | ICD-10-CM

## 2011-08-25 DIAGNOSIS — Z23 Encounter for immunization: Secondary | ICD-10-CM

## 2011-08-25 LAB — COMPREHENSIVE METABOLIC PANEL
ALT: 16 U/L (ref 0–35)
AST: 18 U/L (ref 0–37)
Albumin: 4.1 g/dL (ref 3.5–5.2)
Alkaline Phosphatase: 64 U/L (ref 39–117)
BUN: 23 mg/dL (ref 6–23)
Calcium: 9.6 mg/dL (ref 8.4–10.5)
Chloride: 105 mEq/L (ref 96–112)
Creatinine, Ser: 1.8 mg/dL — ABNORMAL HIGH (ref 0.4–1.2)
Potassium: 3.8 mEq/L (ref 3.5–5.1)

## 2011-08-25 LAB — CBC WITH DIFFERENTIAL/PLATELET
Basophils Absolute: 0.1 10*3/uL (ref 0.0–0.1)
Basophils Relative: 1 % (ref 0.0–3.0)
Eosinophils Absolute: 0.2 10*3/uL (ref 0.0–0.7)
Lymphocytes Relative: 30.4 % (ref 12.0–46.0)
MCHC: 33.3 g/dL (ref 30.0–36.0)
MCV: 96.5 fl (ref 78.0–100.0)
Monocytes Absolute: 0.7 10*3/uL (ref 0.1–1.0)
Neutrophils Relative %: 55.4 % (ref 43.0–77.0)
RDW: 13.7 % (ref 11.5–14.6)

## 2011-08-25 LAB — URINALYSIS, ROUTINE W REFLEX MICROSCOPIC
Nitrite: NEGATIVE
Specific Gravity, Urine: 1.025 (ref 1.000–1.030)
Total Protein, Urine: NEGATIVE
Urine Glucose: NEGATIVE
pH: 5.5 (ref 5.0–8.0)

## 2011-08-25 LAB — FERRITIN: Ferritin: 21.9 ng/mL (ref 10.0–291.0)

## 2011-08-25 LAB — LIPID PANEL
Cholesterol: 254 mg/dL — ABNORMAL HIGH (ref 0–200)
Triglycerides: 195 mg/dL — ABNORMAL HIGH (ref 0.0–149.0)

## 2011-08-25 LAB — URIC ACID: Uric Acid, Serum: 10.6 mg/dL — ABNORMAL HIGH (ref 2.4–7.0)

## 2011-08-25 LAB — IBC PANEL
Iron: 93 ug/dL (ref 42–145)
Saturation Ratios: 20.8 % (ref 20.0–50.0)

## 2011-08-25 LAB — LDL CHOLESTEROL, DIRECT: Direct LDL: 174.7 mg/dL

## 2011-08-25 MED ORDER — COLESEVELAM HCL 625 MG PO TABS: 1875.0000 mg | ORAL_TABLET | Freq: Two times a day (BID) | ORAL | Status: DC

## 2011-08-25 NOTE — Assessment & Plan Note (Addendum)
CBC and vitamin levels today, will also check her for celiac disease

## 2011-08-25 NOTE — Assessment & Plan Note (Signed)
CBC and vitamin levels today 

## 2011-08-25 NOTE — Progress Notes (Signed)
Subjective:    Patient ID: Yesenia [name], female    DOB: [birth year], 78 y.o.   MRN: [id number]  Diarrhea  This is a chronic problem. The current episode started more than 1 year ago. The problem occurs 2 to 4 times per day. The problem has been unchanged. The stool consistency is described as watery. Associated symptoms include abdominal pain (crampy). Pertinent negatives include no arthralgias, bloating, chills, coughing, fever, headaches, increased  flatus, myalgias, sweats, URI, vomiting or weight loss. Nothing aggravates the symptoms. She has tried nothing for the symptoms. Her past medical history is significant for a recent abdominal surgery.      Review of Systems  Constitutional: Negative for fever, chills, weight loss, diaphoresis, activity change, appetite change, fatigue and unexpected weight change.  HENT: Negative.   Eyes: Negative.   Respiratory: Negative for apnea, cough, choking, chest tightness, shortness of breath, wheezing and stridor.   Cardiovascular: Negative for chest pain, palpitations and leg swelling.  Gastrointestinal: Positive for abdominal pain (crampy) and diarrhea. Negative for nausea, vomiting, constipation, blood in stool, abdominal distention, anal bleeding, rectal pain, bloating and flatus.  Genitourinary: Negative for dysuria, urgency, frequency, hematuria, flank pain, enuresis, difficulty urinating and dyspareunia.  Musculoskeletal: Negative for myalgias, back pain, joint swelling, arthralgias and gait problem.  Skin: Negative for color change, pallor, rash and wound.  Neurological: Negative.  Negative for headaches.  Hematological: Negative for adenopathy. Does not bruise/bleed easily.  Psychiatric/Behavioral: Negative.        Objective:   Physical Exam  Vitals reviewed. Constitutional: She is oriented to person, place, and time. She appears well-developed and well-nourished. No distress.  HENT:  Head: Normocephalic and atraumatic.    Mouth/Throat: Oropharynx is clear and moist. No oropharyngeal exudate.  Eyes: Conjunctivae are normal. Right eye exhibits no discharge. Left eye exhibits no discharge. No scleral icterus.  Neck: Normal range of motion. Neck supple. No JVD present. No tracheal deviation present. No thyromegaly present.  Cardiovascular: Normal rate, regular rhythm, normal heart sounds and intact distal pulses.  Exam reveals no gallop and no friction rub.   No murmur heard. Pulmonary/Chest: Effort normal and breath sounds normal. No stridor. No respiratory distress. She has no wheezes. She has no rales. She exhibits no tenderness.  Abdominal: Soft. Bowel sounds are normal. She exhibits no distension and no mass. There is no tenderness. There is no rebound and no guarding.  Musculoskeletal: Normal range of motion. She exhibits no edema and no tenderness.  Lymphadenopathy:    She has no cervical adenopathy.  Neurological: She is oriented to person, place, and time.  Skin: Skin is warm and dry. No rash noted. She is not diaphoretic. No erythema. No pallor.  Psychiatric: She has a normal mood and affect. Her behavior is normal. Judgment and thought content normal.      Lab Results  Component Value Date   WBC 7.2 03/21/2011   HGB 13.7 03/21/2011   HCT 41.5 03/21/2011   PLT 291 03/21/2011   GLUCOSE 85 03/21/2011   CHOL 242* 01/26/2011   TRIG 267.0* 01/26/2011   HDL 40.60 01/26/2011   LDLDIRECT 180.4 01/26/2011   ALT 19 01/26/2011   AST 22 01/26/2011   NA 138 03/21/2011   K 4.7 03/21/2011   CL 101 03/21/2011   CREATININE 1.52* 03/21/2011   BUN 21 03/21/2011   CO2 28 03/21/2011   TSH 1.42 01/26/2011   HGBA1C 6.5 01/26/2011   MICROALBUR 8.3* 06/25/2008      Assessment & Plan:

## 2011-08-25 NOTE — Assessment & Plan Note (Signed)
Her BP is well controlled, I will check her lytes and renal function 

## 2011-08-25 NOTE — Assessment & Plan Note (Signed)
I will check her TSh today

## 2011-08-25 NOTE — Assessment & Plan Note (Addendum)
I will check his level of BS control with an a1c today and will monitor her renal function, start welchol

## 2011-08-25 NOTE — Assessment & Plan Note (Signed)
She describes chronic diarrhea that she feels like has worsened in the time since her gallbladder was removed, I will check her for celiac disease today, she will start welchol as a bile acid seq may help, I will check her other labs to look for anemia, infection, dehydration, abnormal lytes, etc.

## 2011-08-25 NOTE — Patient Instructions (Signed)
Diabetes, Type 2 Diabetes is a long-lasting (chronic) disease. In type 2 diabetes, the pancreas does not make enough insulin (a hormone), and the body does not respond normally to the insulin that is made. This type of diabetes was also previously called adult-onset diabetes. It usually occurs after the age of 37, but it can occur at any age.  CAUSES  Type 2 diabetes happens because the pancreasis not making enough insulin or your body has trouble using the insulin that your pancreas does make properly. SYMPTOMS   Drinking more than usual.   Urinating more than usual.   Blurred vision.   Dry, itchy skin.   Frequent infections.   Feeling more tired than usual (fatigue).  DIAGNOSIS The diagnosis of type 2 diabetes is usually made by one of the following tests:  Fasting blood glucose test. You will not eat for at least 8 hours and then take a blood test.   Random blood glucose test. Your blood glucose (sugar) is checked at any time of the day regardless of when you ate.   Oral glucose tolerance test (OGTT). Your blood glucose is measured after you have not eaten (fasted) and then after you drink a glucose containing beverage.  TREATMENT   Healthy eating.   Exercise.   Medicine, if needed.   Monitoring blood glucose.   Seeing your caregiver regularly.  HOME CARE INSTRUCTIONS   Check your blood glucose at least once a day. More frequent monitoring may be necessary, depending on your medicines and on how well your diabetes is controlled. Your caregiver will advise you.   Take your medicine as directed by your caregiver.   Do not smoke.   Make wise food choices. Ask your caregiver for information. Weight loss can improve your diabetes.   Learn about low blood glucose (hypoglycemia) and how to treat it.   Get your eyes checked regularly.   Have a yearly physical exam. Have your blood pressure checked and your blood and urine tested.   Wear a pendant or bracelet saying  that you have diabetes.   Check your feet every night for cuts, sores, blisters, and redness. Let your caregiver know if you have any problems.  SEEK MEDICAL CARE IF:   You have problems keeping your blood glucose in target range.   You have problems with your medicines.   You have symptoms of an illness that do not improve after 24 hours.   You have a sore or wound that is not healing.   You notice a change in vision or a new problem with your vision.   You have a fever.  MAKE SURE YOU:  Understand these instructions.   Will watch your condition.   Will get help right away if you are not doing well or get worse.  Document Released: 02/28/2005 Document Revised: 02/17/2011 Document Reviewed: 08/16/2010 [facility] Patient Information 2012 [place].Diarrhea Infections caused by germs (bacterial) or a virus commonly cause diarrhea. Your caregiver has determined that with time, rest and fluids, the diarrhea should improve. In general, eat normally while drinking more water than usual. Although water may prevent dehydration, it does not contain salt and minerals (electrolytes). Broths, weak tea without caffeine and oral rehydration solutions (ORS) replace fluids and electrolytes. Small amounts of fluids should be taken frequently. Large amounts at one time may not be tolerated. Plain water may be harmful in infants and the elderly. Oral rehydrating solutions (ORS) are available at pharmacies and grocery stores. ORS replace water and  important electrolytes in proper proportions. Sports drinks are not as effective as ORS and may be harmful due to sugars worsening diarrhea.  ORS is especially recommended for use in children with diarrhea. As a general guideline for children, replace any new fluid losses from diarrhea and/or vomiting with ORS as follows:   If your child weighs 22 pounds or under (10 kg or less), give 60-120 mL ( -  cup or 2 - 4 ounces) of ORS for each episode of diarrheal  stool or vomiting episode.   If your child weighs more than 22 pounds (more than 10 kgs), give 120-240 mL ( - 1 cup or 4 - 8 ounces) of ORS for each diarrheal stool or episode of vomiting.   While correcting for dehydration, children should eat normally. However, foods high in sugar should be avoided because this may worsen diarrhea. Large amounts of carbonated soft drinks, juice, gelatin desserts and other highly sugared drinks should be avoided.   After correction of dehydration, other liquids that are appealing to the child may be added. Children should drink small amounts of fluids frequently and fluids should be increased as tolerated. Children should drink enough fluids to keep urine clear or pale yellow.   Adults should eat normally while drinking more fluids than usual. Drink small amounts of fluids frequently and increase as tolerated. Drink enough fluids to keep urine clear or pale yellow. Broths, weak decaffeinated tea, lemon lime soft drinks (allowed to go flat) and ORS replace fluids and electrolytes.   Avoid:   Carbonated drinks.   Juice.   Extremely hot or cold fluids.   Caffeine drinks.   Fatty, greasy foods.   Alcohol.   Tobacco.   Too much intake of anything at one time.   Gelatin desserts.   Probiotics are active cultures of beneficial bacteria. They may lessen the amount and number of diarrheal stools in adults. Probiotics can be found in yogurt with active cultures and in supplements.   Wash hands well to avoid spreading bacteria and virus.   Anti-diarrheal medications are not recommended for infants and children.   Only take over-the-counter or prescription medicines for pain, discomfort or fever as directed by your caregiver. Do not give aspirin to children because it may cause Reye's Syndrome.   For adults, ask your caregiver if you should continue all prescribed and over-the-counter medicines.   If your caregiver has given you a follow-up appointment,  it is very important to keep that appointment. Not keeping the appointment could result in a chronic or permanent injury, and disability. If there is any problem keeping the appointment, you must call back to this facility for assistance.  SEEK IMMEDIATE MEDICAL CARE IF:   You or your child is unable to keep fluids down or other symptoms or problems become worse in spite of treatment.   Vomiting or diarrhea develops and becomes persistent.   There is vomiting of blood or bile (green material).   There is blood in the stool or the stools are black and tarry.   There is no urine output in 6-8 hours or there is only a small amount of very dark urine.   Abdominal pain develops, increases or localizes.   You have a fever.   Your baby is older than 3 months with a rectal temperature of 102 F (38.9 C) or higher.   Your baby is 74 months old or younger with a rectal temperature of 100.4 F (38 C) or higher.  You or your child develops excessive weakness, dizziness, fainting or extreme thirst.   You or your child develops a rash, stiff neck, severe headache or become irritable or sleepy and difficult to awaken.  MAKE SURE YOU:   Understand these instructions.   Will watch your condition.   Will get help right away if you are not doing well or get worse.  Document Released: 02/18/2002 Document Revised: 02/17/2011 Document Reviewed: 01/05/2009 [facility] Patient Information 2012 [place].

## 2011-08-25 NOTE — Assessment & Plan Note (Addendum)
FLP today, start welchol

## 2011-08-26 LAB — GLIA (IGA/G) + TTG IGA: Tissue Transglutaminase Ab, IgA: 4.6 U/mL (ref <20)

## 2011-09-15 ENCOUNTER — Other Ambulatory Visit: Payer: Self-pay | Admitting: Gastroenterology

## 2011-09-19 ENCOUNTER — Encounter: Payer: Self-pay | Admitting: *Deleted

## 2011-09-22 ENCOUNTER — Ambulatory Visit (INDEPENDENT_AMBULATORY_CARE_PROVIDER_SITE_OTHER): Payer: Medicare Other | Admitting: Gastroenterology

## 2011-09-22 ENCOUNTER — Other Ambulatory Visit: Payer: Medicare Other

## 2011-09-22 ENCOUNTER — Encounter: Payer: Self-pay | Admitting: Gastroenterology

## 2011-09-22 VITALS — BP 150/80 | HR 64 | Ht 62.0 in | Wt 203.8 lb

## 2011-09-22 DIAGNOSIS — Z800 Family history of malignant neoplasm of digestive organs: Secondary | ICD-10-CM

## 2011-09-22 DIAGNOSIS — K869 Disease of pancreas, unspecified: Secondary | ICD-10-CM

## 2011-09-22 DIAGNOSIS — Z09 Encounter for follow-up examination after completed treatment for conditions other than malignant neoplasm: Secondary | ICD-10-CM

## 2011-09-22 DIAGNOSIS — R1084 Generalized abdominal pain: Secondary | ICD-10-CM

## 2011-09-22 DIAGNOSIS — G8929 Other chronic pain: Secondary | ICD-10-CM

## 2011-09-22 DIAGNOSIS — Z9089 Acquired absence of other organs: Secondary | ICD-10-CM

## 2011-09-22 DIAGNOSIS — Z9049 Acquired absence of other specified parts of digestive tract: Secondary | ICD-10-CM

## 2011-09-22 DIAGNOSIS — Z9889 Other specified postprocedural states: Secondary | ICD-10-CM

## 2011-09-22 NOTE — Patient Instructions (Addendum)
Your physician has requested that you go to the basement for the following lab work before leaving today:CA 19-9.  [facility]  [facility] at 9:15am. Please arrive at 8:45am and nothing to eat or drink after midnight. We will call with these results once they have become available.  cc: Scarlette Calico, MD

## 2011-09-22 NOTE — Progress Notes (Signed)
This is a very complex 78 year old Caucasian female with chronic, chronic diffuse abdominal pain of unexplained etiology refractory to repair of hiatal hernia, cholecystectomy, and general medical management. She's had negative endoscopies, colonoscopy, and review of all of her radiographs show a CT scan in November which suggested a possible cystic lesion of her pancreas. She continues with diffuse abdominal pain made worse by eating, chronic nausea, no emesis, and chronic constipation secondary to use of multiple doses of bile-salt resins. She denies melena, hematochezia, fever, chills, or any specific hepatobiliary complaints. She denies abuse of alcohol or cigarettes or NSAIDs. Medications include daily Protonix 40 mg, oral B12, and daily thyroid medication. She has a history of multiple drug allergies.  Current Medications, Allergies, Past Medical History, Past Surgical History, Family History and Social History were reviewed in [employer] record.  Pertinent Review of Systems Negative   Physical Exam: Blood pressure 150 or 80, pulse 64 and regular, and weight 203 pounds the BMI of 37.28. She is obese female in no acute distress. I cannot appreciate stigmata of chronic liver disease. Her abdomen is difficult to examine but shows mild hepatomegaly in the right upper quadrant, no splenomegaly, other abdominal masses or localized tenderness. I cannot appreciate ascites. Bowel sounds are nonobstructive. Mental status is normal.    Assessment and Plan: Chronic diffuse abdominal pain of unexplained etiology. Review of her scan shows no evidence of significant mesenteric arterial disease. We will followup her pancreatic lesion with pancreatic MRI. She has chronic renal insufficiency and probably should not have any further dye studies. CA 19-9 also ordered for review. I have decreased her Welchol as tolerated per her bowel movements. She is up-to-date her endoscopy and colonoscopy  exams. Patient also has had small bowel pill camera exam which was normal in 2009. She does have a family history of colon cancer. Recent CBC and metabolic profile was entirely normal including liver enzymes.  Please copy her primary care physician, referring physician, and pertinent subspecialists. Encounter Diagnosis  Name Primary?  . Pancreatic lesion Yes

## 2011-09-27 ENCOUNTER — Ambulatory Visit
Admission: RE | Admit: 2011-09-27 | Discharge: 2011-09-27 | Disposition: A | Discharge status: Home / Self Care | Payer: Medicare Other | Source: Ambulatory Visit | Attending: Gastroenterology | Admitting: Gastroenterology

## 2011-09-27 DIAGNOSIS — K869 Disease of pancreas, unspecified: Secondary | ICD-10-CM

## 2011-10-01 ENCOUNTER — Other Ambulatory Visit: Payer: Self-pay | Admitting: Internal Medicine

## 2011-10-04 ENCOUNTER — Encounter (HOSPITAL_COMMUNITY): Payer: Self-pay | Admitting: Pharmacy Technician

## 2011-10-04 ENCOUNTER — Other Ambulatory Visit: Payer: Self-pay | Admitting: Gastroenterology

## 2011-10-04 DIAGNOSIS — K8020 Calculus of gallbladder without cholecystitis without obstruction: Secondary | ICD-10-CM

## 2011-10-05 ENCOUNTER — Encounter (HOSPITAL_COMMUNITY): Payer: Self-pay | Admitting: Gastroenterology

## 2011-10-05 DIAGNOSIS — R932 Abnormal findings on diagnostic imaging of liver and biliary tract: Secondary | ICD-10-CM

## 2011-10-05 NOTE — H&P (View-Only) (Signed)
This is a very complex 78 year old Caucasian female with chronic, chronic diffuse abdominal pain of unexplained etiology refractory to repair of hiatal hernia, cholecystectomy, and general medical management. She's had negative endoscopies, colonoscopy, and review of all of her radiographs show a CT scan in November which suggested a possible cystic lesion of her pancreas. She continues with diffuse abdominal pain made worse by eating, chronic nausea, no emesis, and chronic constipation secondary to use of multiple doses of bile-salt resins. She denies melena, hematochezia, fever, chills, or any specific hepatobiliary complaints. She denies abuse of alcohol or cigarettes or NSAIDs. Medications include daily Protonix 40 mg, oral B12, and daily thyroid medication. She has a history of multiple drug allergies.  Current Medications, Allergies, Past Medical History, Past Surgical History, Family History and Social History were reviewed in [employer] record.  Pertinent Review of Systems Negative   Physical Exam: Blood pressure 150 or 80, pulse 64 and regular, and weight 203 pounds the BMI of 37.28. She is obese female in no acute distress. I cannot appreciate stigmata of chronic liver disease. Her abdomen is difficult to examine but shows mild hepatomegaly in the right upper quadrant, no splenomegaly, other abdominal masses or localized tenderness. I cannot appreciate ascites. Bowel sounds are nonobstructive. Mental status is normal.    Assessment and Plan: Chronic diffuse abdominal pain of unexplained etiology. Review of her scan shows no evidence of significant mesenteric arterial disease. We will followup her pancreatic lesion with pancreatic MRI. She has chronic renal insufficiency and probably should not have any further dye studies. CA 19-9 also ordered for review. I have decreased her Welchol as tolerated per her bowel movements. She is up-to-date her endoscopy and colonoscopy  exams. Patient also has had small bowel pill camera exam which was normal in 2009. She does have a family history of colon cancer. Recent CBC and metabolic profile was entirely normal including liver enzymes.  Please copy her primary care physician, referring physician, and pertinent subspecialists. Encounter Diagnosis  Name Primary?  . Pancreatic lesion Yes

## 2011-10-05 NOTE — Interval H&P Note (Signed)
History and Physical Interval Note:  10/05/2011 2:26 PM  Yesenia [name]  has presented today for surgery, with the diagnosis of Gallstones [574.20]  The various methods of treatment have been discussed with the patient and family. After consideration of risks, benefits and other options for treatment, the patient has consented to  Procedure(s) (LRB): ENDOSCOPIC RETROGRADE CHOLANGIOPANCREATOGRAPHY (ERCP) (N/A) as a surgical intervention .  The patient's history has been reviewed, patient examined, no change in status, stable for surgery.  I have reviewed the patient's chart and labs.  Questions were answered to the patient's satisfaction.    The recent H&P (dated *09/22/11 **) was reviewed, the patient was examined and there is no change in the patients condition since that H&P was completed.   Erskine Emery  10/05/2011, 2:26 PM    Erskine Emery

## 2011-10-06 ENCOUNTER — Encounter (HOSPITAL_COMMUNITY): Payer: Self-pay | Admitting: Gastroenterology

## 2011-10-06 ENCOUNTER — Ambulatory Visit (HOSPITAL_COMMUNITY): Payer: Medicare Other | Admitting: Certified Registered"

## 2011-10-06 ENCOUNTER — Encounter (HOSPITAL_COMMUNITY): Payer: Self-pay | Admitting: Certified Registered"

## 2011-10-06 ENCOUNTER — Encounter (HOSPITAL_COMMUNITY): Payer: Self-pay | Admitting: *Deleted

## 2011-10-06 ENCOUNTER — Ambulatory Visit (HOSPITAL_COMMUNITY)
Admission: RE | Admit: 2011-10-06 | Discharge: 2011-10-06 | Disposition: A | Discharge status: Home / Self Care | Payer: Medicare Other | Source: Ambulatory Visit | Attending: Gastroenterology | Admitting: Gastroenterology

## 2011-10-06 ENCOUNTER — Ambulatory Visit (HOSPITAL_COMMUNITY): Payer: Medicare Other

## 2011-10-06 ENCOUNTER — Encounter (HOSPITAL_COMMUNITY)
Admission: RE | Disposition: A | Discharge status: Home / Self Care | Payer: Self-pay | Source: Ambulatory Visit | Attending: Gastroenterology

## 2011-10-06 DIAGNOSIS — R932 Abnormal findings on diagnostic imaging of liver and biliary tract: Secondary | ICD-10-CM

## 2011-10-06 DIAGNOSIS — I10 Essential (primary) hypertension: Secondary | ICD-10-CM | POA: Insufficient documentation

## 2011-10-06 DIAGNOSIS — J449 Chronic obstructive pulmonary disease, unspecified: Secondary | ICD-10-CM | POA: Insufficient documentation

## 2011-10-06 DIAGNOSIS — K8050 Calculus of bile duct without cholangitis or cholecystitis without obstruction: Secondary | ICD-10-CM

## 2011-10-06 DIAGNOSIS — K219 Gastro-esophageal reflux disease without esophagitis: Secondary | ICD-10-CM | POA: Insufficient documentation

## 2011-10-06 DIAGNOSIS — K8020 Calculus of gallbladder without cholecystitis without obstruction: Secondary | ICD-10-CM

## 2011-10-06 DIAGNOSIS — J4489 Other specified chronic obstructive pulmonary disease: Secondary | ICD-10-CM | POA: Insufficient documentation

## 2011-10-06 DIAGNOSIS — R443 Hallucinations, unspecified: Secondary | ICD-10-CM | POA: Insufficient documentation

## 2011-10-06 DIAGNOSIS — E039 Hypothyroidism, unspecified: Secondary | ICD-10-CM | POA: Insufficient documentation

## 2011-10-06 DIAGNOSIS — N289 Disorder of kidney and ureter, unspecified: Secondary | ICD-10-CM | POA: Insufficient documentation

## 2011-10-06 DIAGNOSIS — K449 Diaphragmatic hernia without obstruction or gangrene: Secondary | ICD-10-CM | POA: Insufficient documentation

## 2011-10-06 HISTORY — PX: ERCP: SHX5425

## 2011-10-06 LAB — CBC
Hemoglobin: 14.1 g/dL (ref 12.0–15.0)
MCV: 95.3 fL (ref 78.0–100.0)
Platelets: 241 10*3/uL (ref 150–400)
RBC: 4.3 MIL/uL (ref 3.87–5.11)
WBC: 6.6 10*3/uL (ref 4.0–10.5)

## 2011-10-06 LAB — BASIC METABOLIC PANEL
CO2: 28 mEq/L (ref 19–32)
Calcium: 9.7 mg/dL (ref 8.4–10.5)
Potassium: 4.4 mEq/L (ref 3.5–5.1)
Sodium: 140 mEq/L (ref 135–145)

## 2011-10-06 SURGERY — ERCP, WITH INTERVENTION IF INDICATED
Anesthesia: General

## 2011-10-06 MED ORDER — PROPOFOL 10 MG/ML IV EMUL
INTRAVENOUS | Status: DC | PRN
  Administered 2011-10-06: 120 mg via INTRAVENOUS

## 2011-10-06 MED ORDER — LACTATED RINGERS IV SOLN
INTRAVENOUS | Status: DC | PRN
  Administered 2011-10-06: 13:00:00 via INTRAVENOUS

## 2011-10-06 MED ORDER — SODIUM CHLORIDE 0.9 % IV SOLN
INTRAVENOUS | Status: DC | PRN
  Administered 2011-10-06: 15:00:00

## 2011-10-06 MED ORDER — GLYCOPYRROLATE 0.2 MG/ML IJ SOLN
INTRAMUSCULAR | Status: DC | PRN
  Administered 2011-10-06: 0.6 mg via INTRAVENOUS

## 2011-10-06 MED ORDER — CIPROFLOXACIN IN D5W 400 MG/200ML IV SOLN
INTRAVENOUS | Status: AC
  Filled 2011-10-06: qty 200

## 2011-10-06 MED ORDER — FENTANYL CITRATE 0.05 MG/ML IJ SOLN
INTRAMUSCULAR | Status: DC | PRN
  Administered 2011-10-06: 50 ug via INTRAVENOUS

## 2011-10-06 MED ORDER — MIDAZOLAM HCL 2 MG/2ML IJ SOLN: 0.5000 mg | Freq: Once | INTRAMUSCULAR | Status: DC | PRN

## 2011-10-06 MED ORDER — ONDANSETRON HCL 4 MG/2ML IJ SOLN
INTRAMUSCULAR | Status: DC | PRN
  Administered 2011-10-06: 4 mg via INTRAVENOUS

## 2011-10-06 MED ORDER — ROCURONIUM BROMIDE 100 MG/10ML IV SOLN
INTRAVENOUS | Status: DC | PRN
  Administered 2011-10-06: 30 mg via INTRAVENOUS

## 2011-10-06 MED ORDER — PROMETHAZINE HCL 25 MG/ML IJ SOLN: 6.2500 mg | INTRAMUSCULAR | Status: DC | PRN

## 2011-10-06 MED ORDER — NEOSTIGMINE METHYLSULFATE 1 MG/ML IJ SOLN
INTRAMUSCULAR | Status: DC | PRN
  Administered 2011-10-06: 1 mg via INTRAVENOUS
  Administered 2011-10-06: 4 mg via INTRAVENOUS

## 2011-10-06 MED ORDER — LACTATED RINGERS IV SOLN
INTRAVENOUS | Status: DC
Start: 2011-10-06 — End: 2011-10-06
  Administered 2011-10-06: 13:00:00 via INTRAVENOUS

## 2011-10-06 MED ORDER — MEPERIDINE HCL 25 MG/ML IJ SOLN: 6.2500 mg | INTRAMUSCULAR | Status: DC | PRN

## 2011-10-06 MED ORDER — FENTANYL CITRATE 0.05 MG/ML IJ SOLN: 25.0000 ug | INTRAMUSCULAR | Status: DC | PRN

## 2011-10-06 MED ORDER — CIPROFLOXACIN IN D5W 400 MG/200ML IV SOLN
400.0000 mg | Freq: Once | INTRAVENOUS | Status: AC
  Administered 2011-10-06: 400 mg via INTRAVENOUS
  Filled 2011-10-06 (×2): qty 200

## 2011-10-06 NOTE — OR Nursing (Signed)
After intubation, patient placed in prone position on OR table. Placed on chest rolls. Knees  Padded, safety strap in place.

## 2011-10-06 NOTE — Anesthesia Postprocedure Evaluation (Signed)
  Anesthesia Post-op Note  Patient: Yesenia [name]  Procedure(s) Performed: Procedure(s) (LRB): ENDOSCOPIC RETROGRADE CHOLANGIOPANCREATOGRAPHY (ERCP) (N/A)  Patient Location: PACU  Anesthesia Type: General  Level of Consciousness: awake, alert  and oriented  Airway and Oxygen Therapy: Patient Spontanous Breathing  Post-op Pain: none  Post-op Assessment: Post-op Vital signs reviewed, Patient's Cardiovascular Status Stable, Respiratory Function Stable, Patent Airway, No signs of Nausea or vomiting and Pain level controlled  Post-op Vital Signs: Reviewed and stable  Complications: No apparent anesthesia complications

## 2011-10-06 NOTE — Anesthesia Preprocedure Evaluation (Addendum)
Anesthesia Evaluation  Patient identified by MRN, date of birth, ID band Patient awake    Reviewed: Allergy & Precautions, H&P , NPO status , Patient's Chart, lab work & pertinent test results  History of Anesthesia Complications Negative for: history of anesthetic complications  Airway Mallampati: II TM Distance: >3 FB Neck ROM: Full    Dental  (+) Teeth Intact, Dental Advisory Given, Poor Dentition, Chipped and Missing   Pulmonary shortness of breath and with exertion, neg pneumonia -, COPDformer smoker (quit 20 years),  breath sounds clear to auscultation  Pulmonary exam normal       Cardiovascular hypertension, Pt. on medications Rhythm:Regular Rate:Normal  '10 ECHO: normal LVF, normal valves   Neuro/Psych    GI/Hepatic Neg liver ROS, hiatal hernia, GERD-  Medicated and Controlled,  Endo/Other  Hypothyroidism (on replacement) Morbid obesity  Renal/GU Renal InsufficiencyRenal disease (creat 1.7)     Musculoskeletal   Abdominal (+) + obese,   Peds  Hematology   Anesthesia Other Findings   Reproductive/Obstetrics                          Anesthesia Physical Anesthesia Plan  ASA: III  Anesthesia Plan: General   Post-op Pain Management:    Induction: Intravenous  Airway Management Planned: Oral ETT  Additional Equipment:   Intra-op Plan:   Post-operative Plan: Extubation in OR  Informed Consent: I have reviewed the patients History and Physical, chart, labs and discussed the procedure including the risks, benefits and alternatives for the proposed anesthesia with the patient or authorized representative who has indicated his/her understanding and acceptance.   Dental advisory given  Plan Discussed with: CRNA and Surgeon  Anesthesia Plan Comments: (Plan routine monitors, GETA)        Anesthesia Quick Evaluation

## 2011-10-06 NOTE — Op Note (Signed)
[facility] [place], [place]  [id number]  ERCP PROCEDURE REPORT  PATIENT:  Yesenia [name], Yesenia [name]  MR#:  [id number] BIRTHDATE:  02-16-1930  GENDER:  female  ENDOSCOPIST:  Sandy Salaam. Deatra Ina, MD ASSISTANT:  Cherylynn Ridges, Technician, Alcide Clever, RN, CGRN  PROCEDURE DATE:  10/06/2011 PROCEDURE:  ERCP with removal of stones, ERCP with sphincterotomy  INDICATIONS:  suspected stone  MEDICATIONS:   MAC sedation, administered by CRNA, cipro 400 mg IV TOPICAL ANESTHETIC:  DESCRIPTION OF PROCEDURE:   After the risks benefits and alternatives of the procedure were thoroughly explained, informed consent was obtained.  The [id number] endoscope was introduced through the mouth and advanced to the second portion of the duodenum.  The pancreatic duct was successfully cannulated and filled with contrast. Care was taken not to overfill the duct. Pancreatic duct was cannulated with a 0.22mm guidewire. NO INJECTION WAS DONE Multiple stones were found in the common bile duct. CBD was selectively cannulated. At least 1 8-38mm filling defect was seen in the distal duct. 49mm sphincterotomy was done. Duct was swept with a 12.5 and 70mm balloon stone extractor. At least 6 5-30mm stones were removed. Final cholangiogram was WNL (see image2, image3, image4, and image5).    The scope was then completely withdrawn from the patient and the procedure terminated. <<PROCEDUREIMAGES>>  COMPLICATIONS:  None  ENDOSCOPIC IMPRESSION: 1) Stones, multiple in the common bile duct - s/p sphincterotomy and removal of stones RECOMMENDATIONS: 1) observation 2) OV with Dr. Sharlett Iles 3-4 weeks  ______________________________ Sandy Salaam. Deatra Ina, MD  cc: Dr. Verl Blalock cc: Dr. Gretta Arab  CC:  n. eSIGNED:   Sandy Salaam. Kaplan at 10/06/2011 01:54 PM  Karna Christmas, [id number]

## 2011-10-06 NOTE — Transfer of Care (Signed)
Immediate Anesthesia Transfer of Care Note  Patient: Yesenia [name]  Procedure(s) Performed: Procedure(s) (LRB): ENDOSCOPIC RETROGRADE CHOLANGIOPANCREATOGRAPHY (ERCP) (N/A)  Patient Location: PACU  Anesthesia Type: General  Level of Consciousness: awake, alert  and oriented  Airway & Oxygen Therapy: Patient Spontanous Breathing and Patient connected to face mask oxygen  Post-op Assessment: Report given to PACU RN, Post -op Vital signs reviewed and stable and Patient moving all extremities X 4  Post vital signs: Reviewed and stable  Complications: No apparent anesthesia complications

## 2011-10-06 NOTE — Interval H&P Note (Signed)
History and Physical Interval Note:  10/06/2011 1:56 PM  Yesenia [name]  has presented today for surgery, with the diagnosis of Gallstones [574.20]  The various methods of treatment have been discussed with the patient and family. After consideration of risks, benefits and other options for treatment, the patient has consented to  Procedure(s) (LRB): ENDOSCOPIC RETROGRADE CHOLANGIOPANCREATOGRAPHY (ERCP) (N/A) as a surgical intervention .  The patient's history has been reviewed, patient examined, no change in status, stable for surgery.  I have reviewed the patient's chart and labs.  Questions were answered to the patient's satisfaction.     The recent H&P (dated *09/22/11*) was reviewed, the patient was examined and there is no change in the patients condition since that H&P was completed.   Erskine Emery  10/06/2011, 1:56 PM   Erskine Emery

## 2011-10-10 ENCOUNTER — Encounter (HOSPITAL_COMMUNITY): Payer: Self-pay | Admitting: Gastroenterology

## 2011-10-18 ENCOUNTER — Other Ambulatory Visit: Payer: Self-pay | Admitting: Gastroenterology

## 2011-10-27 ENCOUNTER — Ambulatory Visit (INDEPENDENT_AMBULATORY_CARE_PROVIDER_SITE_OTHER): Payer: Medicare Other | Admitting: Gastroenterology

## 2011-10-27 ENCOUNTER — Encounter: Payer: Self-pay | Admitting: Gastroenterology

## 2011-10-27 VITALS — BP 126/84 | HR 68 | Ht 61.0 in | Wt 205.2 lb

## 2011-10-27 DIAGNOSIS — K8020 Calculus of gallbladder without cholecystitis without obstruction: Secondary | ICD-10-CM

## 2011-10-27 DIAGNOSIS — K9186 Retained cholelithiasis following cholecystectomy: Secondary | ICD-10-CM

## 2011-10-27 DIAGNOSIS — Y836 Removal of other organ (partial) (total) as the cause of abnormal reaction of the patient, or of later complication, without mention of misadventure at the time of the procedure: Secondary | ICD-10-CM

## 2011-10-27 NOTE — Patient Instructions (Addendum)
We have given you a copy of your ERCP. CC: Dr Scarlette Calico

## 2011-10-27 NOTE — Progress Notes (Signed)
History of Present Illness: This is a complicated 78 year old Caucasian female who underwent laparoscopic cholecystectomy in January, and was recently found to have multiple common bile duct stones removed by endoscopic sphincterotomy per Dr. Erskine Emery. She currently is asymptomatic, and is on daily Protonix for GERD. She's had some urticaria with Welchol use, but otherwise denies symptomatology. Her prior diarrhea and abdominal pain have resolved.    Current Medications, Allergies, Past Medical History, Past Surgical History, Family History and Social History were reviewed in [employer] record.   Assessment and plan: We will hold her Welchol and see how she does symptomatically. Hopefully all of her symptoms will continue to be controlled after her sphincterotomy and common duct stone removal. If her bile-salt enteropathy returns, perhaps we will try powdered Questran. She is otherwise to continue medications as listed and reviewed with primary care followup per Dr. Ronnald Ramp as scheduled. Please copy Dr. Scarlette Calico in primary care No diagnosis found.

## 2011-12-05 ENCOUNTER — Other Ambulatory Visit: Payer: Self-pay

## 2011-12-05 MED ORDER — LEVOTHYROXINE SODIUM 50 MCG PO TABS: ORAL_TABLET | ORAL | Status: DC

## 2011-12-28 ENCOUNTER — Telehealth: Payer: Self-pay

## 2011-12-28 ENCOUNTER — Ambulatory Visit (INDEPENDENT_AMBULATORY_CARE_PROVIDER_SITE_OTHER): Payer: Medicare Other | Admitting: *Deleted

## 2011-12-28 DIAGNOSIS — L219 Seborrheic dermatitis, unspecified: Secondary | ICD-10-CM

## 2011-12-28 DIAGNOSIS — Z23 Encounter for immunization: Secondary | ICD-10-CM

## 2011-12-28 MED ORDER — CLOTRIMAZOLE-BETAMETHASONE 1-0.05 % EX LOTN: TOPICAL_LOTION | Freq: Two times a day (BID) | CUTANEOUS | Status: DC

## 2011-12-28 NOTE — Telephone Encounter (Signed)
done

## 2011-12-28 NOTE — Telephone Encounter (Signed)
Patient c/o facial rash and would like to request RX

## 2012-04-21 ENCOUNTER — Other Ambulatory Visit: Payer: Self-pay | Admitting: Gastroenterology

## 2012-05-28 ENCOUNTER — Telehealth: Payer: Self-pay | Admitting: *Deleted

## 2012-05-28 MED ORDER — LEVOTHYROXINE SODIUM 50 MCG PO TABS: ORAL_TABLET | ORAL | Status: DC

## 2012-05-28 NOTE — Telephone Encounter (Signed)
Rx to pharmacy/SLS 

## 2012-05-31 ENCOUNTER — Other Ambulatory Visit: Payer: Self-pay | Admitting: Gastroenterology

## 2012-07-28 ENCOUNTER — Other Ambulatory Visit: Payer: Self-pay | Admitting: Internal Medicine

## 2012-07-31 ENCOUNTER — Ambulatory Visit (INDEPENDENT_AMBULATORY_CARE_PROVIDER_SITE_OTHER): Payer: Medicare Other | Admitting: Gastroenterology

## 2012-07-31 ENCOUNTER — Encounter: Payer: Self-pay | Admitting: Gastroenterology

## 2012-07-31 ENCOUNTER — Other Ambulatory Visit (INDEPENDENT_AMBULATORY_CARE_PROVIDER_SITE_OTHER): Payer: Medicare Other

## 2012-07-31 VITALS — BP 140/78 | HR 72 | Wt 209.0 lb

## 2012-07-31 DIAGNOSIS — Z9889 Other specified postprocedural states: Secondary | ICD-10-CM

## 2012-07-31 DIAGNOSIS — K5730 Diverticulosis of large intestine without perforation or abscess without bleeding: Secondary | ICD-10-CM

## 2012-07-31 DIAGNOSIS — Z9049 Acquired absence of other specified parts of digestive tract: Secondary | ICD-10-CM

## 2012-07-31 DIAGNOSIS — R1013 Epigastric pain: Secondary | ICD-10-CM

## 2012-07-31 DIAGNOSIS — K219 Gastro-esophageal reflux disease without esophagitis: Secondary | ICD-10-CM

## 2012-07-31 LAB — CBC WITH DIFFERENTIAL/PLATELET
Basophils Absolute: 0 10*3/uL (ref 0.0–0.1)
Eosinophils Absolute: 0.2 10*3/uL (ref 0.0–0.7)
Lymphocytes Relative: 29.4 % (ref 12.0–46.0)
MCHC: 34.5 g/dL (ref 30.0–36.0)
MCV: 94.8 fl (ref 78.0–100.0)
Monocytes Absolute: 0.7 10*3/uL (ref 0.1–1.0)
Neutrophils Relative %: 58 % (ref 43.0–77.0)
Platelets: 296 10*3/uL (ref 150.0–400.0)
RBC: 4.43 Mil/uL (ref 3.87–5.11)
RDW: 13.6 % (ref 11.5–14.6)

## 2012-07-31 LAB — COMPREHENSIVE METABOLIC PANEL
ALT: 15 U/L (ref 0–35)
AST: 23 U/L (ref 0–37)
Albumin: 4 g/dL (ref 3.5–5.2)
Alkaline Phosphatase: 62 U/L (ref 39–117)
Calcium: 9.5 mg/dL (ref 8.4–10.5)
Chloride: 102 mEq/L (ref 96–112)
Potassium: 4.7 mEq/L (ref 3.5–5.1)

## 2012-07-31 LAB — HEPATIC FUNCTION PANEL
ALT: 15 U/L (ref 0–35)
AST: 23 U/L (ref 0–37)
Alkaline Phosphatase: 62 U/L (ref 39–117)
Bilirubin, Direct: 0.1 mg/dL (ref 0.0–0.3)
Total Protein: 7.8 g/dL (ref 6.0–8.3)

## 2012-07-31 LAB — BASIC METABOLIC PANEL
BUN: 22 mg/dL (ref 6–23)
CO2: 30 mEq/L (ref 19–32)
Chloride: 102 mEq/L (ref 96–112)
Creatinine, Ser: 1.8 mg/dL — ABNORMAL HIGH (ref 0.4–1.2)
Potassium: 4.7 mEq/L (ref 3.5–5.1)

## 2012-07-31 LAB — LIPASE: Lipase: 61 U/L — ABNORMAL HIGH (ref 11.0–59.0)

## 2012-07-31 LAB — SEDIMENTATION RATE: Sed Rate: 20 mm/hr (ref 0–22)

## 2012-07-31 NOTE — Progress Notes (Signed)
This is a single confusing 79 year old Caucasian female it as a positive GI review of systems.  She complains of upper and lower abdominal pain, nausea, dark stools, alternating diarrhea and constipation, gas and bloating.  His had multiple surgical procedures including fundoplication, cholecystectomy, ERCP with removal of common bile duct stones, and has chronic diverticulosis with previous colonoscopy and endoscopy in April 2011 showing no other abnormalities.  She is on Protonix 40 mg twice a day and continues to complain of epigastric and left upper quadrant pain.  He denies melena, hematochezia, anorexia, weight loss or history of hepatitis or pancreatitis.  She has multiple drug allergies.  She denies abuse of alcohol, NSAID, or cigarettes.  ..  Current Medications, Allergies, Past Medical History, Past Surgical History, Family History and Social History were reviewed in [employer] record.  ROS: All systems were reviewed and are negative unless otherwise stated in the HPI.          Physical Exam: Blood pressure 140/78, pulse 72 and weight 209 with a BMI of 31.59.  Chest is generally clear she appear to be in a regular rhythm without murmurs gallops or rubs.  She has a big obese abdomen without definite distention, organomegaly, masses, localized tenderness.  Stool is formed and guaiac-negative.  Mental status is normal.   Assessment and Plan{ Is hard to tell with this particular patient exactly what her problem is because she complains of almost all GI symptoms.  She probably has" gas bloat syndrome" related to her fundoplication and chronic IBS and diverticulosis coli..  We need of course need to exclude retained common bile duct stones, chronic pancreatitis, occult malignancy.  We will check lab tests including liver function, amylase, lipase, and followup CT scan of the abdomen and pelvis.  She also has a history of a duodenal diverticulum.  I do not think she needs  followup endoscopy or colonoscopy at this time.  There seems to be a disconnect between the patient's symptomatology and objective findings.  Chart review reveals that her abdominal  is been present for several years.  She is persistent complaining that  her problems to her ERCP exam. This  Was completed in July of 2013,and may need to be repeated dependent on CT scan and lab results.  Previous C. 19-9 was normal.

## 2012-07-31 NOTE — Patient Instructions (Addendum)
Your physician has requested that you go to the basement for lab work before leaving today.   You have been scheduled for a CT scan of the abdomen and pelvis at [facility] ([address]---this is in the same building as [facility]).   You are scheduled on 08-02-12 at 1:30 pm. You should arrive 15 minutes prior to your appointment time for registration. Please follow the written instructions below on the day of your exam:  WARNING: IF YOU ARE ALLERGIC TO IODINE/X-RAY DYE, PLEASE NOTIFY RADIOLOGY IMMEDIATELY AT [phone]! YOU WILL BE GIVEN A 13 HOUR PREMEDICATION PREP.  1) Do not eat or drink anything after 9:30 am (4 hours prior to your test) 2) You have been given 2 bottles of oral contrast to drink. The solution may taste better if refrigerated, but do NOT add ice or any other liquid to this solution. Shake well before drinking.    Drink 1 bottle of contrast @ 11:30 pm (2 hours prior to your exam)  Drink 1 bottle of contrast @ 12:30 pm (1 hour prior to your exam)  You may take any medications as prescribed with a small amount of water except for the following: Metformin, Glucophage, Glucovance, Avandamet, Riomet, Fortamet, Actoplus Met, Janumet, Glumetza or Metaglip. The above medications must be held the day of the exam AND 48 hours after the exam.  The purpose of you drinking the oral contrast is to aid in the visualization of your intestinal tract. The contrast solution may cause some diarrhea. Before your exam is started, you will be given a small amount of fluid to drink. Depending on your individual set of symptoms, you may also receive an intravenous injection of x-ray contrast/dye. Plan on being at [facility] for 30 minutes or long, depending on the type of exam you are having performed.  This test typically takes 30-45 minutes to complete.  If you have any questions regarding your exam or if you need to reschedule, you may call the CT department at  [phone] between the hours of 8:00 am and 5:00 pm, Monday-Friday.  ________________________________________________________________________

## 2012-08-01 ENCOUNTER — Telehealth: Payer: Self-pay | Admitting: *Deleted

## 2012-08-01 NOTE — Telephone Encounter (Signed)
Rose called from [facility] and said that patients Creatinine is high and they cannot do IV contrast, just oral.  Is that ok?

## 2012-08-01 NOTE — Telephone Encounter (Signed)
I called Rose and notified her of Dr. Buel Ream answer

## 2012-08-01 NOTE — Telephone Encounter (Signed)
I agree

## 2012-08-02 ENCOUNTER — Ambulatory Visit (INDEPENDENT_AMBULATORY_CARE_PROVIDER_SITE_OTHER)
Admission: RE | Admit: 2012-08-02 | Discharge: 2012-08-02 | Disposition: A | Discharge status: Home / Self Care | Payer: Medicare Other | Source: Ambulatory Visit | Attending: Gastroenterology | Admitting: Gastroenterology

## 2012-08-02 DIAGNOSIS — R1013 Epigastric pain: Secondary | ICD-10-CM

## 2012-08-14 ENCOUNTER — Other Ambulatory Visit: Payer: Self-pay | Admitting: Internal Medicine

## 2012-09-17 ENCOUNTER — Other Ambulatory Visit: Payer: Self-pay | Admitting: Internal Medicine

## 2012-11-19 ENCOUNTER — Other Ambulatory Visit (INDEPENDENT_AMBULATORY_CARE_PROVIDER_SITE_OTHER): Payer: Medicare Other

## 2012-11-19 ENCOUNTER — Encounter: Payer: Self-pay | Admitting: Internal Medicine

## 2012-11-19 ENCOUNTER — Ambulatory Visit (INDEPENDENT_AMBULATORY_CARE_PROVIDER_SITE_OTHER): Payer: Medicare Other | Admitting: Internal Medicine

## 2012-11-19 VITALS — BP 112/80 | HR 88 | Temp 98.3°F | Resp 16 | Wt 210.0 lb

## 2012-11-19 DIAGNOSIS — N184 Chronic kidney disease, stage 4 (severe): Secondary | ICD-10-CM

## 2012-11-19 DIAGNOSIS — Z23 Encounter for immunization: Secondary | ICD-10-CM

## 2012-11-19 DIAGNOSIS — IMO0001 Reserved for inherently not codable concepts without codable children: Secondary | ICD-10-CM

## 2012-11-19 DIAGNOSIS — I10 Essential (primary) hypertension: Secondary | ICD-10-CM

## 2012-11-19 DIAGNOSIS — E785 Hyperlipidemia, unspecified: Secondary | ICD-10-CM

## 2012-11-19 DIAGNOSIS — E538 Deficiency of other specified B group vitamins: Secondary | ICD-10-CM

## 2012-11-19 DIAGNOSIS — E039 Hypothyroidism, unspecified: Secondary | ICD-10-CM

## 2012-11-19 LAB — COMPREHENSIVE METABOLIC PANEL
ALT: 16 U/L (ref 0–35)
Alkaline Phosphatase: 63 U/L (ref 39–117)
CO2: 29 mEq/L (ref 19–32)
Creatinine, Ser: 1.8 mg/dL — ABNORMAL HIGH (ref 0.4–1.2)
GFR: 29.61 mL/min — ABNORMAL LOW (ref >60.00)
Glucose, Bld: 100 mg/dL — ABNORMAL HIGH (ref 70–99)
Sodium: 142 mEq/L (ref 135–145)
Total Bilirubin: 0.9 mg/dL (ref 0.3–1.2)

## 2012-11-19 LAB — LIPID PANEL
HDL: 38.9 mg/dL — ABNORMAL LOW (ref >39.00)
Total CHOL/HDL Ratio: 7
VLDL: 48 mg/dL — ABNORMAL HIGH (ref 0.0–40.0)

## 2012-11-19 LAB — CBC WITH DIFFERENTIAL/PLATELET
Eosinophils Relative: 3 % (ref 0.0–5.0)
HCT: 41.6 % (ref 36.0–46.0)
Hemoglobin: 14.3 g/dL (ref 12.0–15.0)
Lymphs Abs: 2.3 10*3/uL (ref 0.7–4.0)
MCV: 94.8 fl (ref 78.0–100.0)
Monocytes Absolute: 0.6 10*3/uL (ref 0.1–1.0)
Monocytes Relative: 8.4 % (ref 3.0–12.0)
Neutro Abs: 4 10*3/uL (ref 1.4–7.7)
WBC: 7.2 10*3/uL (ref 4.5–10.5)

## 2012-11-19 LAB — HEMOGLOBIN A1C: Hgb A1c MFr Bld: 6.2 % (ref 4.6–6.5)

## 2012-11-19 LAB — HM DIABETES FOOT EXAM

## 2012-11-19 MED ORDER — ROSUVASTATIN CALCIUM 20 MG PO TABS: 20.0000 mg | ORAL_TABLET | Freq: Every day | ORAL | Status: DC

## 2012-11-19 NOTE — Progress Notes (Signed)
Subjective:    Patient ID: Yesenia [name], female    DOB: 24-Nov-1929, 79 y.o.   MRN: [id number]  Hypertension This is a chronic problem. The current episode started more than 1 year ago. The problem is unchanged. The problem is controlled. Pertinent negatives include no anxiety, blurred vision, chest pain, headaches, malaise/fatigue, neck pain, orthopnea, palpitations, peripheral edema, PND, shortness of breath or sweats. Agents associated with hypertension include thyroid hormones. Past treatments include angiotensin blockers and diuretics. The current treatment provides moderate improvement. Compliance problems include diet and exercise.  Hypertensive end-organ damage includes a thyroid problem.      Review of Systems  Constitutional: Negative.  Negative for fever, chills, malaise/fatigue, diaphoresis, appetite change and fatigue.  HENT: Negative.  Negative for neck pain.   Eyes: Negative.  Negative for blurred vision.  Respiratory: Negative.  Negative for apnea, cough, choking, chest tightness, shortness of breath, wheezing and stridor.   Cardiovascular: Negative.  Negative for chest pain, palpitations, orthopnea, leg swelling and PND.  Gastrointestinal: Negative.  Negative for nausea, vomiting, abdominal pain, diarrhea and constipation.  Endocrine: Negative.  Negative for polydipsia, polyphagia and polyuria.  Genitourinary: Negative.   Musculoskeletal: Negative.   Skin: Negative.   Allergic/Immunologic: Negative.   Neurological: Negative.  Negative for dizziness, tremors, seizures, syncope, facial asymmetry, speech difficulty, weakness, light-headedness, numbness and headaches.  Hematological: Negative.  Negative for adenopathy. Does not bruise/bleed easily.  Psychiatric/Behavioral: Negative.        Objective:   Physical Exam  Vitals reviewed. Constitutional: She is oriented to person, place, and time. She appears well-developed and well-nourished. No distress.  HENT:  Head:  Normocephalic and atraumatic.  Mouth/Throat: Oropharynx is clear and moist. No oropharyngeal exudate.  Eyes: Conjunctivae are normal. Right eye exhibits no discharge. Left eye exhibits no discharge. No scleral icterus.  Neck: Normal range of motion. Neck supple. No JVD present. No tracheal deviation present. No thyromegaly present.  Cardiovascular: Normal rate, regular rhythm, normal heart sounds and intact distal pulses.  Exam reveals no gallop and no friction rub.   No murmur heard. Pulmonary/Chest: Effort normal and breath sounds normal. No stridor. No respiratory distress. She has no wheezes. She has no rales. She exhibits no tenderness.  Abdominal: Soft. Bowel sounds are normal. She exhibits no distension and no mass. There is no tenderness. There is no rebound and no guarding.  Musculoskeletal: Normal range of motion. She exhibits no edema and no tenderness.  Lymphadenopathy:    She has no cervical adenopathy.  Neurological: She is oriented to person, place, and time.  Skin: Skin is warm and dry. No rash noted. She is not diaphoretic. No erythema. No pallor.  Psychiatric: She has a normal mood and affect. Her behavior is normal. Judgment and thought content normal.     Lab Results  Component Value Date   WBC 7.8 07/31/2012   HGB 14.5 07/31/2012   HCT 41.9 07/31/2012   PLT 296.0 07/31/2012   GLUCOSE 98 07/31/2012   GLUCOSE 98 07/31/2012   CHOL 254* 08/25/2011   TRIG 195.0* 08/25/2011   HDL 42.90 08/25/2011   LDLDIRECT 174.7 08/25/2011   ALT 15 07/31/2012   ALT 15 07/31/2012   AST 23 07/31/2012   AST 23 07/31/2012   NA 139 07/31/2012   NA 139 07/31/2012   K 4.7 07/31/2012   K 4.7 07/31/2012   CL 102 07/31/2012   CL 102 07/31/2012   CREATININE 1.8* 07/31/2012   CREATININE 1.8* 07/31/2012   BUN 22 07/31/2012  BUN 22 07/31/2012   CO2 30 07/31/2012   CO2 30 07/31/2012   TSH 2.13 07/31/2012   HGBA1C 6.3 08/25/2011   MICROALBUR 8.3* 06/25/2008       Assessment & Plan:

## 2012-11-19 NOTE — Assessment & Plan Note (Signed)
Start crestor 

## 2012-11-19 NOTE — Patient Instructions (Signed)

## 2012-11-19 NOTE — Assessment & Plan Note (Signed)
Her BP is well controlled Will check her lytes and renal function

## 2012-11-19 NOTE — Assessment & Plan Note (Signed)
CBC today.  

## 2012-11-19 NOTE — Assessment & Plan Note (Signed)
I will recheck her GFR today and have asked her to see nephrology

## 2012-11-19 NOTE — Assessment & Plan Note (Signed)
I will recheck her A1C and will address if needed 

## 2012-11-19 NOTE — Assessment & Plan Note (Signed)
I will check her TSH and will adjust her dose if needed 

## 2012-11-27 ENCOUNTER — Other Ambulatory Visit: Payer: Self-pay | Admitting: Internal Medicine

## 2012-11-27 ENCOUNTER — Other Ambulatory Visit: Payer: Self-pay | Admitting: Gastroenterology

## 2013-02-18 ENCOUNTER — Ambulatory Visit: Payer: Medicare Other | Admitting: Internal Medicine

## 2013-02-20 IMAGING — CR DG CHEST 2V
2 series · 2 of 2 positions shown · non-contrast
Comparison: 08/24/2009

CLINICAL DATA: Preop for cholecystectomy

CHEST - 2 VIEW

[w chest pa]
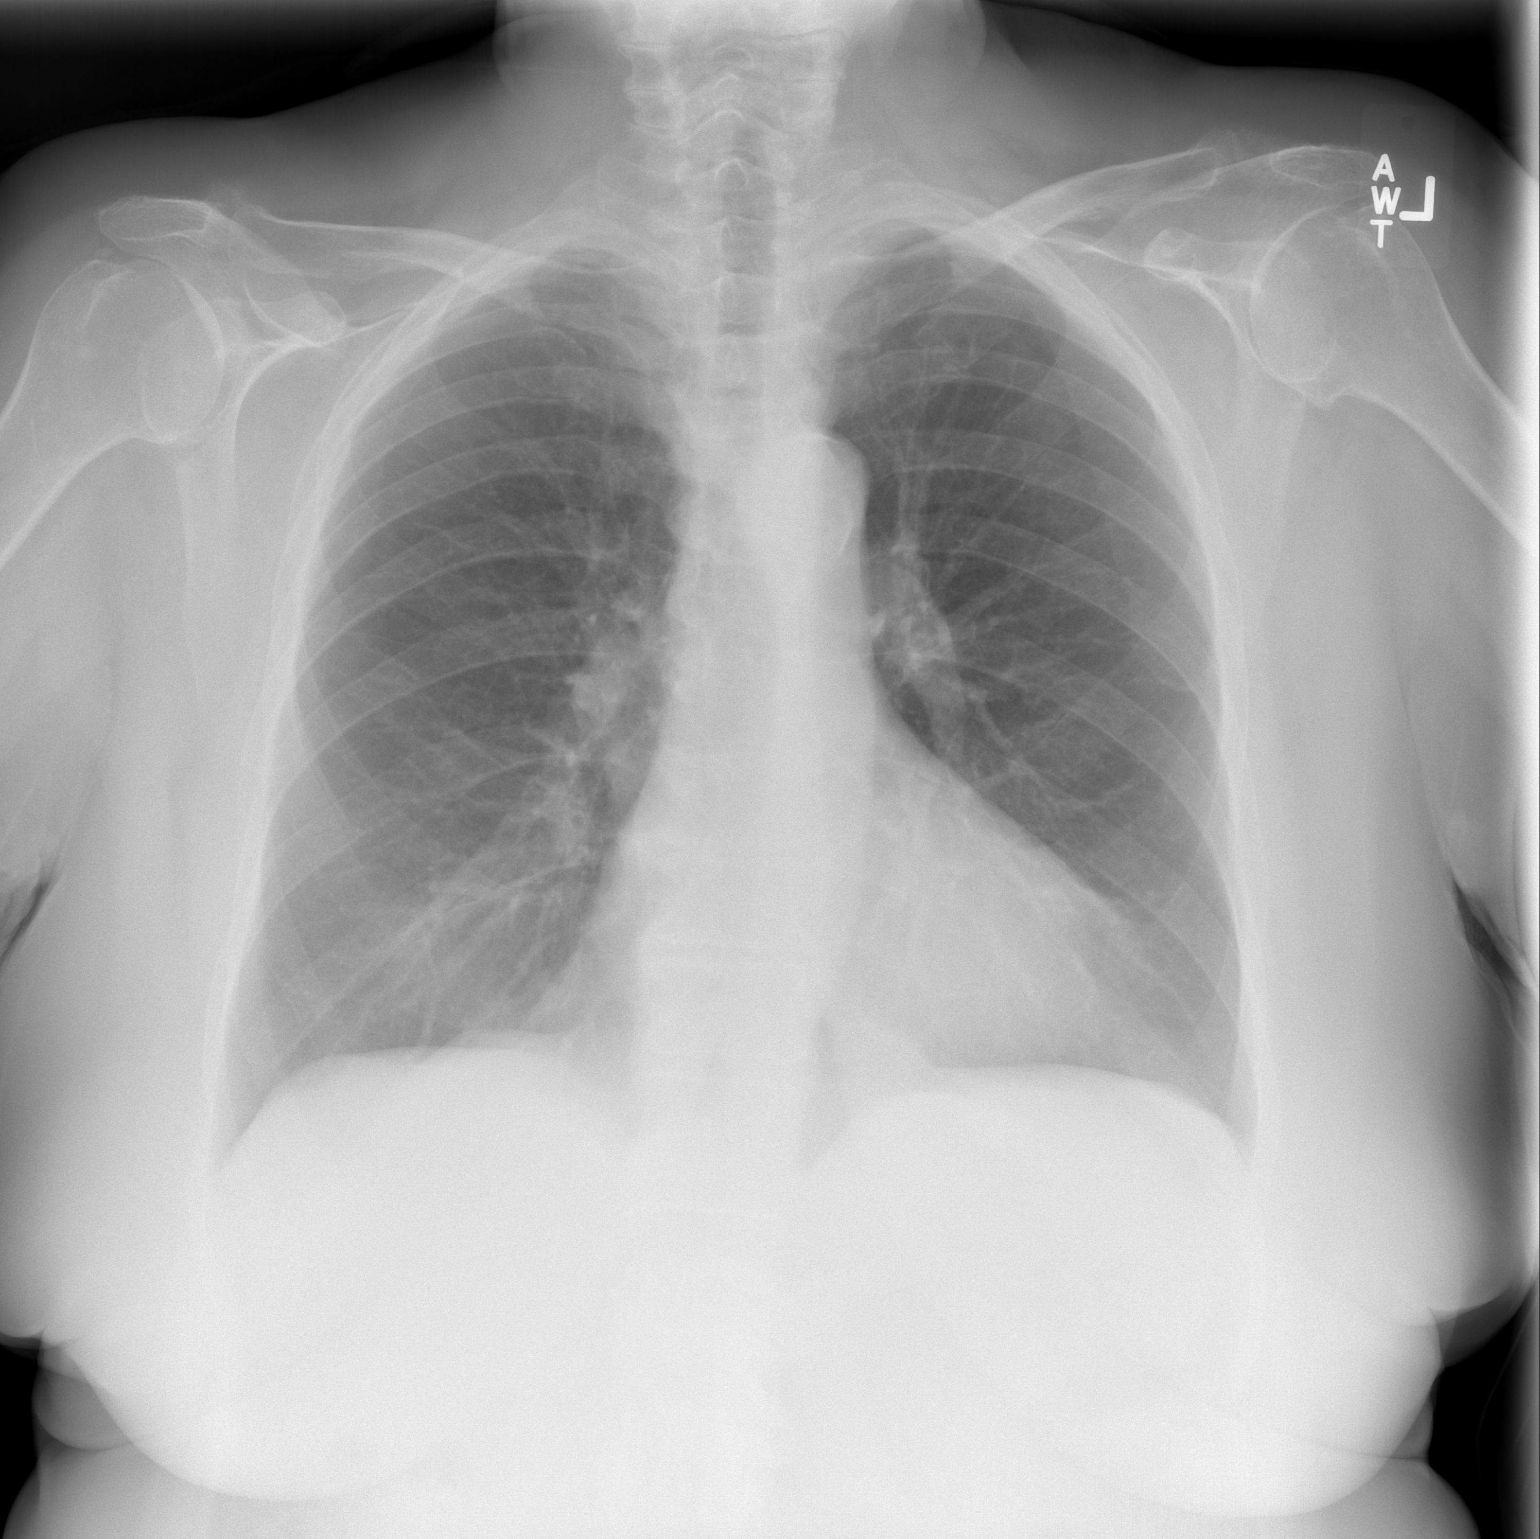

[w chest lat]
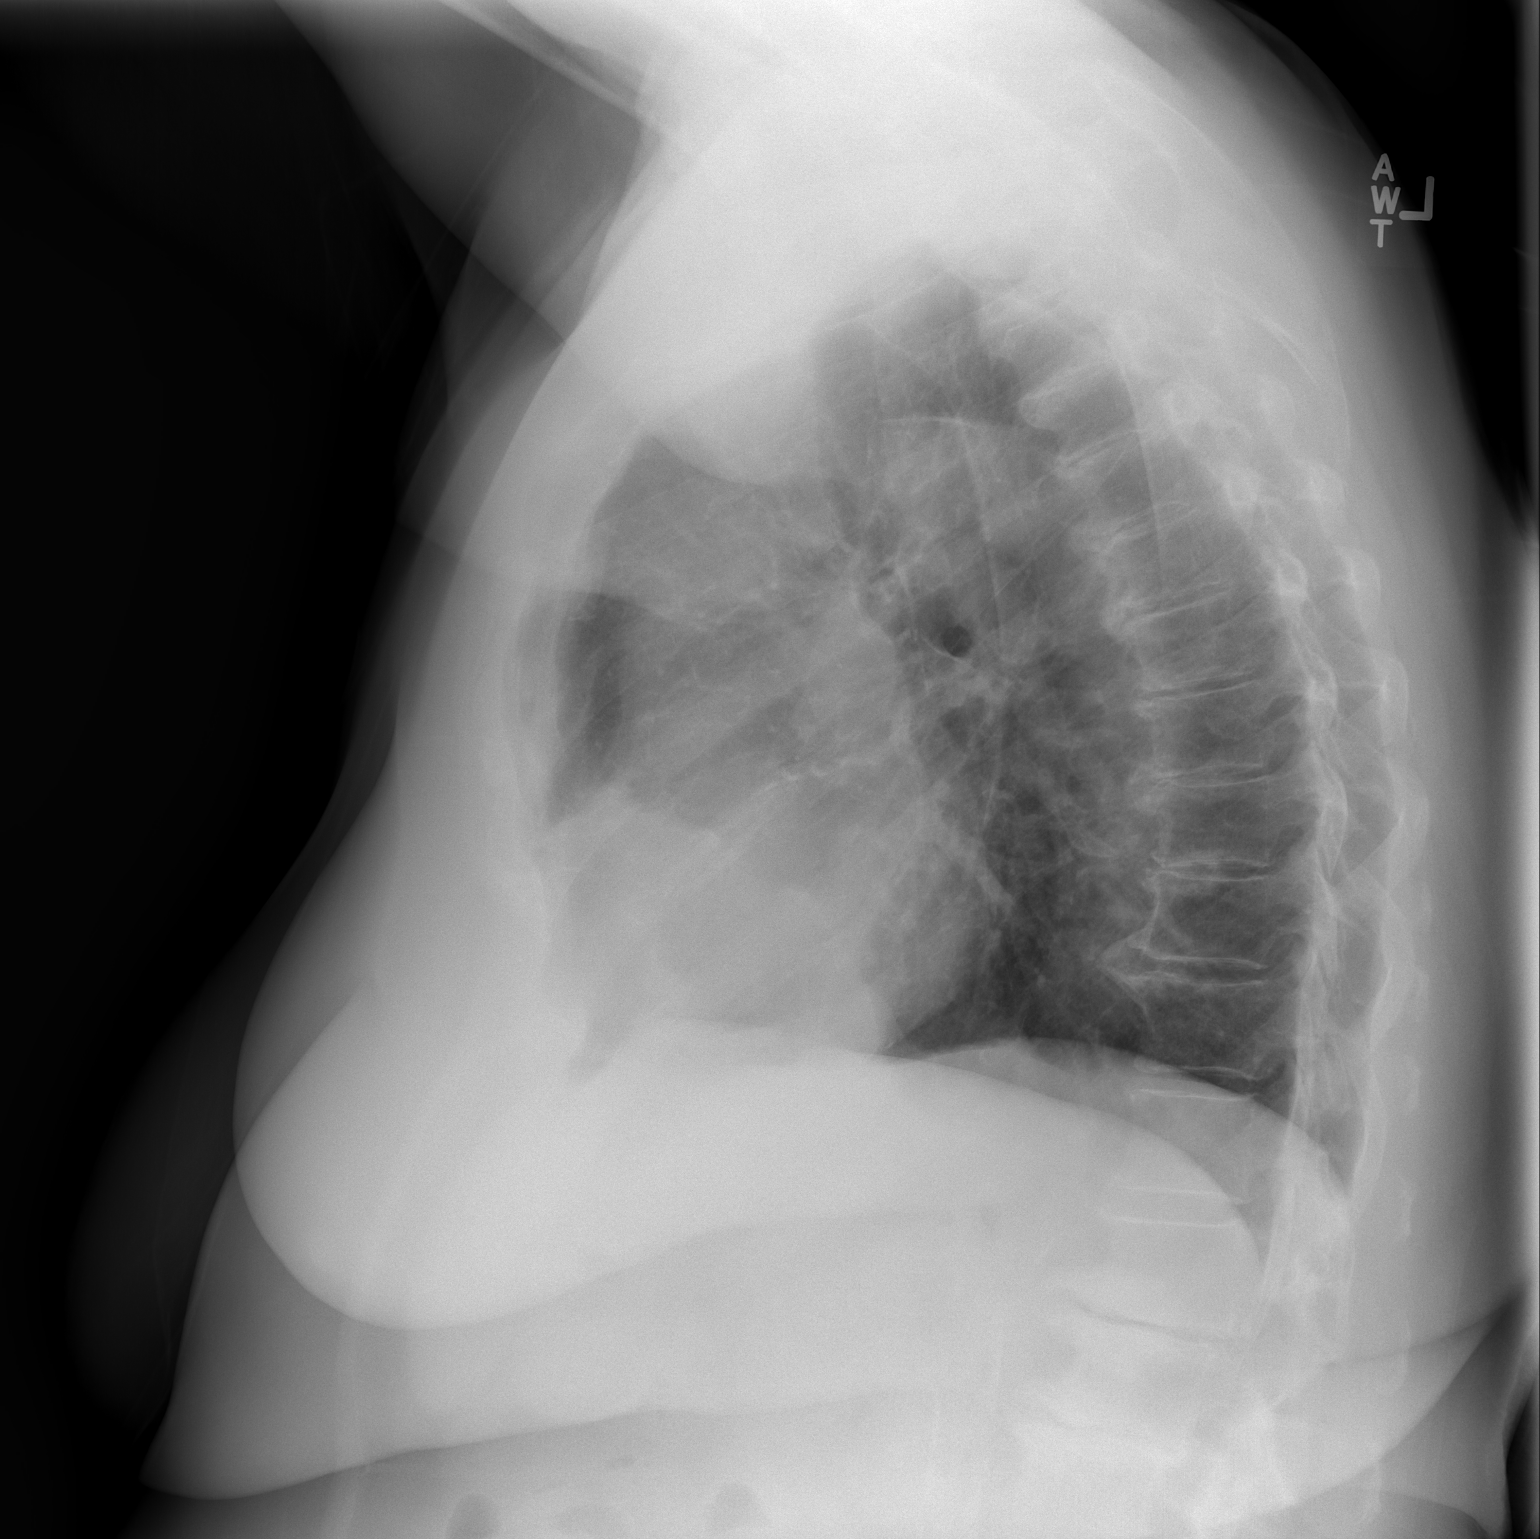

[2 of 2 positions shown; findings below may reference images not displayed]

FINDINGS: Cardiomediastinal silhouette is stable.  No acute
infiltrate or pleural effusion.  No pulmonary edema.  Stable
degenerative changes thoracolumbar spine.
IMPRESSION: No active disease.

## 2013-03-08 ENCOUNTER — Encounter: Payer: Self-pay | Admitting: Internal Medicine

## 2013-03-08 ENCOUNTER — Ambulatory Visit (INDEPENDENT_AMBULATORY_CARE_PROVIDER_SITE_OTHER)
Admission: RE | Admit: 2013-03-08 | Discharge: 2013-03-08 | Disposition: A | Discharge status: Home / Self Care | Payer: Medicare Other | Source: Ambulatory Visit | Attending: Internal Medicine | Admitting: Internal Medicine

## 2013-03-08 ENCOUNTER — Ambulatory Visit (INDEPENDENT_AMBULATORY_CARE_PROVIDER_SITE_OTHER): Payer: Medicare Other | Admitting: Internal Medicine

## 2013-03-08 ENCOUNTER — Other Ambulatory Visit (INDEPENDENT_AMBULATORY_CARE_PROVIDER_SITE_OTHER): Payer: Medicare Other

## 2013-03-08 VITALS — BP 124/84 | HR 67 | Temp 97.3°F | Resp 16 | Wt 210.0 lb

## 2013-03-08 DIAGNOSIS — K589 Irritable bowel syndrome without diarrhea: Secondary | ICD-10-CM | POA: Insufficient documentation

## 2013-03-08 DIAGNOSIS — M25552 Pain in left hip: Secondary | ICD-10-CM

## 2013-03-08 DIAGNOSIS — N184 Chronic kidney disease, stage 4 (severe): Secondary | ICD-10-CM

## 2013-03-08 DIAGNOSIS — E785 Hyperlipidemia, unspecified: Secondary | ICD-10-CM

## 2013-03-08 DIAGNOSIS — I10 Essential (primary) hypertension: Secondary | ICD-10-CM

## 2013-03-08 DIAGNOSIS — E039 Hypothyroidism, unspecified: Secondary | ICD-10-CM

## 2013-03-08 DIAGNOSIS — M25559 Pain in unspecified hip: Secondary | ICD-10-CM

## 2013-03-08 LAB — CBC WITH DIFFERENTIAL/PLATELET
Basophils Absolute: 0 10*3/uL (ref 0.0–0.1)
Hemoglobin: 13.9 g/dL (ref 12.0–15.0)
Lymphocytes Relative: 26.6 % (ref 12.0–46.0)
Monocytes Relative: 9 % (ref 3.0–12.0)
Neutro Abs: 4.5 10*3/uL (ref 1.4–7.7)
Platelets: 276 10*3/uL (ref 150.0–400.0)
RDW: 12.9 % (ref 11.5–14.6)

## 2013-03-08 LAB — TSH: TSH: 2.64 u[IU]/mL (ref 0.35–5.50)

## 2013-03-08 LAB — LDL CHOLESTEROL, DIRECT: Direct LDL: 179.4 mg/dL

## 2013-03-08 LAB — COMPREHENSIVE METABOLIC PANEL
ALT: 15 U/L (ref 0–35)
CO2: 26 mEq/L (ref 19–32)
Calcium: 9.5 mg/dL (ref 8.4–10.5)
Chloride: 105 mEq/L (ref 96–112)
Creatinine, Ser: 1.8 mg/dL — ABNORMAL HIGH (ref 0.4–1.2)
GFR: 28.83 mL/min — ABNORMAL LOW (ref >60.00)
Glucose, Bld: 107 mg/dL — ABNORMAL HIGH (ref 70–99)

## 2013-03-08 LAB — LIPID PANEL
Cholesterol: 251 mg/dL — ABNORMAL HIGH (ref 0–200)
Triglycerides: 265 mg/dL — ABNORMAL HIGH (ref 0.0–149.0)

## 2013-03-08 MED ORDER — LINACLOTIDE 290 MCG PO CAPS: 290.0000 ug | ORAL_CAPSULE | Freq: Every day | ORAL | Status: DC

## 2013-03-08 NOTE — Progress Notes (Signed)
Subjective:    Patient ID: Yesenia [name], female    DOB: [birth year], 79 y.o.   MRN: [id number]  Abdominal Pain This is a chronic problem. The current episode started more than 1 year ago. The onset quality is gradual. The problem occurs intermittently. The problem has been unchanged. The pain is located in the generalized abdominal region. The pain is at a severity of 2/10. The pain is mild. The quality of the pain is aching. Associated symptoms include arthralgias (both hips), constipation and diarrhea. Pertinent negatives include no anorexia, belching, dysuria, fever, frequency, headaches, hematochezia, hematuria, melena, myalgias, nausea, vomiting or weight loss. Nothing aggravates the pain. The pain is relieved by nothing. She has tried nothing for the symptoms. Prior diagnostic workup includes GI consult and surgery. Her past medical history is significant for abdominal surgery and gallstones. There is no history of colon cancer, Crohn's disease, GERD, irritable bowel syndrome, pancreatitis, PUD or ulcerative colitis.      Review of Systems  Constitutional: Negative.  Negative for fever, chills, weight loss, diaphoresis, activity change, appetite change, fatigue and unexpected weight change.  HENT: Negative.   Eyes: Negative.   Respiratory: Negative.  Negative for apnea, cough, choking, chest tightness, shortness of breath, wheezing and stridor.   Cardiovascular: Negative.  Negative for chest pain, palpitations and leg swelling.  Gastrointestinal: Positive for abdominal pain, diarrhea and constipation. Negative for nausea, vomiting, blood in stool, melena, hematochezia, abdominal distention, anal bleeding, rectal pain and anorexia.  Endocrine: Negative.   Genitourinary: Negative.  Negative for dysuria, frequency and hematuria.  Musculoskeletal: Positive for arthralgias (both hips). Negative for back pain, gait problem, joint swelling, myalgias, neck pain and neck stiffness.  Skin:  Negative.   Allergic/Immunologic: Negative.   Neurological: Negative.  Negative for dizziness, tremors, light-headedness and headaches.  Hematological: Negative.  Negative for adenopathy. Does not bruise/bleed easily.  Psychiatric/Behavioral: Negative.        Objective:   Physical Exam  Vitals reviewed. Constitutional: She is oriented to person, place, and time. She appears well-developed and well-nourished.  Non-toxic appearance. She does not have a sickly appearance. She does not appear ill. No distress.  HENT:  Head: Normocephalic and atraumatic.  Mouth/Throat: Oropharynx is clear and moist. No oropharyngeal exudate.  Eyes: Conjunctivae are normal. Right eye exhibits no discharge. Left eye exhibits no discharge. No scleral icterus.  Neck: Normal range of motion. Neck supple. No JVD present. No tracheal deviation present. No thyromegaly present.  Cardiovascular: Normal rate, regular rhythm, normal heart sounds and intact distal pulses.  Exam reveals no gallop and no friction rub.   No murmur heard. Pulmonary/Chest: Effort normal and breath sounds normal. No stridor. No respiratory distress. She has no wheezes. She has no rales. She exhibits no tenderness.  Abdominal: Soft. Normal appearance and bowel sounds are normal. She exhibits no shifting dullness, no distension, no pulsatile liver, no fluid wave, no abdominal bruit, no ascites, no pulsatile midline mass and no mass. There is no hepatosplenomegaly, splenomegaly or hepatomegaly. There is no tenderness. There is no rigidity, no rebound, no guarding, no CVA tenderness, no tenderness at McBurney's point and negative Murphy's sign. No hernia. Hernia confirmed negative in the ventral area, confirmed negative in the right inguinal area and confirmed negative in the left inguinal area.  Musculoskeletal: Normal range of motion. She exhibits no edema and no tenderness.       Right hip: Normal.       Left hip: Normal.  Lymphadenopathy:  She  has no cervical adenopathy.  Neurological: She is oriented to person, place, and time.  Skin: Skin is warm and dry. No rash noted. She is not diaphoretic. No erythema. No pallor.  Psychiatric: She has a normal mood and affect. Her behavior is normal. Judgment and thought content normal.     Lab Results  Component Value Date   WBC 7.2 11/19/2012   HGB 14.3 11/19/2012   HCT 41.6 11/19/2012   PLT 283.0 11/19/2012   GLUCOSE 100* 11/19/2012   CHOL 279* 11/19/2012   TRIG 240.0* 11/19/2012   HDL 38.90* 11/19/2012   LDLDIRECT 204.7 11/19/2012   ALT 16 11/19/2012   AST 22 11/19/2012   NA 142 11/19/2012   K 4.6 11/19/2012   CL 105 11/19/2012   CREATININE 1.8* 11/19/2012   BUN 26* 11/19/2012   CO2 29 11/19/2012   TSH 2.05 11/19/2012   HGBA1C 6.2 11/19/2012   MICROALBUR 8.3* 06/25/2008        Assessment & Plan:

## 2013-03-08 NOTE — Assessment & Plan Note (Signed)
I will recheck her TSH and will adjust her dose if needed 

## 2013-03-08 NOTE — Progress Notes (Signed)
Pre-visit discussion using our clinic review tool. No additional management support is needed unless otherwise documented below in the visit note.  

## 2013-03-08 NOTE — Assessment & Plan Note (Signed)
Plain film shows mild DJD - she tells me that she does not want to treat this with meds or surgery

## 2013-03-08 NOTE — Assessment & Plan Note (Signed)
I will recheck her renal function today Labs were ordered today at the request of her kidney doctor as well to look for evidence of a lymphoproliferative process

## 2013-03-08 NOTE — Patient Instructions (Signed)

## 2013-03-08 NOTE — Assessment & Plan Note (Signed)
She has chronic and pain with mostly constipation and occasional diarrhea so I have asked her to try linzess She has seen GI several times over the last 1-2 years after she had gallstones removed and no pathology was found She will f/up with GI soon

## 2013-03-08 NOTE — Assessment & Plan Note (Signed)
Her BP is well controlled Today, I will check her lytes and renal function

## 2013-03-11 LAB — PTH, INTACT AND CALCIUM
Calcium: 9.7 mg/dL (ref 8.4–10.5)
PTH: 75.3 pg/mL — ABNORMAL HIGH (ref 14.0–72.0)

## 2013-03-12 ENCOUNTER — Encounter: Payer: Self-pay | Admitting: Internal Medicine

## 2013-03-12 LAB — SPEP & IFE WITH QIG
Albumin ELP: 55.5 % — ABNORMAL LOW (ref 55.8–66.1)
Alpha-1-Globulin: 6.7 % — ABNORMAL HIGH (ref 2.9–4.9)
Gamma Globulin: 14.4 % (ref 11.1–18.8)
IgM, Serum: 167 mg/dL (ref 52–322)
Total Protein, Serum Electrophoresis: 7.4 g/dL (ref 6.0–8.3)

## 2013-03-21 ENCOUNTER — Telehealth: Payer: Self-pay | Admitting: *Deleted

## 2013-03-21 NOTE — Telephone Encounter (Signed)
Patient states the Linzess ordered in December has caused her to have uncontrollable diarrhea & abd pain & cramping.  Please advise.  CB# [phone]

## 2013-03-21 NOTE — Telephone Encounter (Signed)
Stop taking it

## 2013-03-21 NOTE — Telephone Encounter (Signed)
Notified patient of MD's instructions.  She verbalized understanding.

## 2013-05-21 ENCOUNTER — Ambulatory Visit: Payer: Medicare Other | Admitting: Internal Medicine

## 2013-05-29 ENCOUNTER — Other Ambulatory Visit: Payer: Self-pay | Admitting: Internal Medicine

## 2013-06-12 ENCOUNTER — Other Ambulatory Visit (INDEPENDENT_AMBULATORY_CARE_PROVIDER_SITE_OTHER): Payer: Medicare Other

## 2013-06-12 ENCOUNTER — Ambulatory Visit (INDEPENDENT_AMBULATORY_CARE_PROVIDER_SITE_OTHER): Payer: Medicare Other | Admitting: Internal Medicine

## 2013-06-12 ENCOUNTER — Encounter: Payer: Self-pay | Admitting: Internal Medicine

## 2013-06-12 VITALS — BP 124/82 | HR 80 | Temp 97.1°F | Resp 16 | Ht 61.0 in | Wt 205.0 lb

## 2013-06-12 DIAGNOSIS — I10 Essential (primary) hypertension: Secondary | ICD-10-CM

## 2013-06-12 DIAGNOSIS — E785 Hyperlipidemia, unspecified: Secondary | ICD-10-CM

## 2013-06-12 DIAGNOSIS — N184 Chronic kidney disease, stage 4 (severe): Secondary | ICD-10-CM

## 2013-06-12 DIAGNOSIS — M25552 Pain in left hip: Secondary | ICD-10-CM

## 2013-06-12 DIAGNOSIS — E039 Hypothyroidism, unspecified: Secondary | ICD-10-CM

## 2013-06-12 DIAGNOSIS — E1165 Type 2 diabetes mellitus with hyperglycemia: Secondary | ICD-10-CM

## 2013-06-12 DIAGNOSIS — E1129 Type 2 diabetes mellitus with other diabetic kidney complication: Secondary | ICD-10-CM

## 2013-06-12 DIAGNOSIS — M1990 Unspecified osteoarthritis, unspecified site: Secondary | ICD-10-CM

## 2013-06-12 DIAGNOSIS — IMO0001 Reserved for inherently not codable concepts without codable children: Secondary | ICD-10-CM

## 2013-06-12 DIAGNOSIS — M25559 Pain in unspecified hip: Secondary | ICD-10-CM

## 2013-06-12 LAB — LIPID PANEL
CHOL/HDL RATIO: 7
CHOLESTEROL: 271 mg/dL — AB (ref 0–200)
HDL: 38.6 mg/dL — ABNORMAL LOW (ref >39.00)
LDL CALC: 187 mg/dL — AB (ref 0–99)
Triglycerides: 229 mg/dL — ABNORMAL HIGH (ref 0.0–149.0)
VLDL: 45.8 mg/dL — AB (ref 0.0–40.0)

## 2013-06-12 LAB — BASIC METABOLIC PANEL
BUN: 27 mg/dL — ABNORMAL HIGH (ref 6–23)
CHLORIDE: 100 meq/L (ref 96–112)
CO2: 29 mEq/L (ref 19–32)
Calcium: 9.5 mg/dL (ref 8.4–10.5)
Creatinine, Ser: 1.8 mg/dL — ABNORMAL HIGH (ref 0.4–1.2)
GFR: 28.09 mL/min — AB (ref >60.00)
Glucose, Bld: 105 mg/dL — ABNORMAL HIGH (ref 70–99)
POTASSIUM: 4.3 meq/L (ref 3.5–5.1)
SODIUM: 137 meq/L (ref 135–145)

## 2013-06-12 LAB — TSH: TSH: 1.46 u[IU]/mL (ref 0.35–5.50)

## 2013-06-12 LAB — HEMOGLOBIN A1C: HEMOGLOBIN A1C: 6.2 % (ref 4.6–6.5)

## 2013-06-12 MED ORDER — ROSUVASTATIN CALCIUM 20 MG PO TABS: 20.0000 mg | ORAL_TABLET | Freq: Every day | ORAL | Status: DC

## 2013-06-12 NOTE — Addendum Note (Signed)
Addended by: Janith Lima on: 06/12/2013 04:53 PM   Modules accepted: Orders

## 2013-06-12 NOTE — Progress Notes (Signed)
Pre visit review using our clinic review tool, if applicable. No additional management support is needed unless otherwise documented below in the visit note. 

## 2013-06-12 NOTE — Assessment & Plan Note (Signed)
Her BP is well controlled 

## 2013-06-12 NOTE — Assessment & Plan Note (Signed)
She does not want anything for pain I have asked her to see sports medicine about this

## 2013-06-12 NOTE — Patient Instructions (Signed)
Osteoarthritis Osteoarthritis is a disease that causes soreness and swelling (inflammation) of a joint. It occurs when the cartilage at the affected joint wears down. Cartilage acts as a cushion, covering the ends of bones where they meet to form a joint. Osteoarthritis is the most common form of arthritis. It often occurs in older people. The joints affected most often by this condition include those in the:  Ends of the fingers.  Thumbs.  Neck.  Lower back.  Knees.  Hips. CAUSES  Over time, the cartilage that covers the ends of bones begins to wear away. This causes bone to rub on bone, producing pain and stiffness in the affected joints.  RISK FACTORS Certain factors can increase your chances of having osteoarthritis, including:  Older age.  Excessive body weight.  Overuse of joints. SIGNS AND SYMPTOMS   Pain, swelling, and stiffness in the joint.  Over time, the joint may lose its normal shape.  Small deposits of bone (osteophytes) may grow on the edges of the joint.  Bits of bone or cartilage can break off and float inside the joint space. This may cause more pain and damage. DIAGNOSIS  Your health care provider will do a physical exam and ask about your symptoms. Various tests may be ordered, such as:  X-rays of the affected joint.  An MRI scan.  Blood tests to rule out other types of arthritis.  Joint fluid tests. This involves using a needle to draw fluid from the joint and examining the fluid under a microscope. TREATMENT  Goals of treatment are to control pain and improve joint function. Treatment plans may include:  A prescribed exercise program that allows for rest and joint relief.  A weight control plan.  Pain relief techniques, such as:  Properly applied heat and cold.  Electric pulses delivered to nerve endings under the skin (transcutaneous electrical nerve stimulation, TENS).  Massage.  Certain nutritional supplements.  Medicines to  control pain, such as:  Acetaminophen.  Nonsteroidal anti-inflammatory drugs (NSAIDs), such as naproxen.  Narcotic or central-acting agents, such as tramadol.  Corticosteroids. These can be given orally or as an injection.  Surgery to reposition the bones and relieve pain (osteotomy) or to remove loose pieces of bone and cartilage. Joint replacement may be needed in advanced states of osteoarthritis. HOME CARE INSTRUCTIONS   Only take over-the-counter or prescription medicines as directed by your health care provider. Take all medicines exactly as instructed.  Maintain a healthy weight. Follow your health care provider's instructions for weight control. This may include dietary instructions.  Exercise as directed. Your health care provider can recommend specific types of exercise. These may include:  Strengthening exercises These are done to strengthen the muscles that support joints affected by arthritis. They can be performed with weights or with exercise bands to add resistance.  Aerobic activities These are exercises, such as brisk walking or low-impact aerobics, that get your heart pumping.  Range-of-motion activities These keep your joints limber.  Balance and agility exercises These help you maintain daily living skills.  Rest your affected joints as directed by your health care provider.  Follow up with your health care provider as directed. SEEK MEDICAL CARE IF:   Your skin turns red.  You develop a rash in addition to your joint pain.  You have worsening joint pain. SEEK IMMEDIATE MEDICAL CARE IF:  You have a significant loss of weight or appetite.  You have a fever along with joint or muscle aches.  You have   night sweats. FOR MORE INFORMATION  National Institute of Arthritis and Musculoskeletal and Skin Diseases: www.niams.nih.gov National Institute on Aging: www.nia.nih.gov American College of Rheumatology: www.rheumatology.org Document Released: 02/28/2005  Document Revised: 12/19/2012 Document Reviewed: 11/05/2012 [employer] Patient Information 2014 [employer].  

## 2013-06-12 NOTE — Progress Notes (Signed)
Subjective:    Patient ID: Yesenia [name], female    DOB: 1930-03-01, 80 y.o.   MRN: [id number]  HPI Comments: She fell 2 months ago and injured her left hip, she tells me that she has been seeing a chiropractor regularly and that an xray was negative for hip fracture but there is DJD in the hip. She is taking tylenol for the pain and she does not want to take anything else for the pain.  Thyroid Problem Presents for follow-up visit. Symptoms include anxiety, constipation and fatigue. Patient reports no cold intolerance, depressed mood, diaphoresis, diarrhea, dry skin, hair loss, heat intolerance, hoarse voice, leg swelling, nail problem, palpitations, tremors, visual change, weight gain or weight loss. The symptoms have been stable. Past treatments include levothyroxine. The treatment provided moderate relief. Her past medical history is significant for diabetes, hyperlipidemia and obesity.      Review of Systems  Constitutional: Positive for fatigue. Negative for fever, chills, weight loss, weight gain, diaphoresis, appetite change and unexpected weight change.  HENT: Negative.  Negative for hoarse voice.   Eyes: Negative.   Respiratory: Negative.  Negative for apnea, cough, choking, chest tightness, shortness of breath, wheezing and stridor.   Cardiovascular: Negative.  Negative for chest pain, palpitations and leg swelling.  Gastrointestinal: Positive for constipation. Negative for nausea, vomiting, abdominal pain, diarrhea, blood in stool, abdominal distention, anal bleeding and rectal pain.  Endocrine: Negative.  Negative for cold intolerance, heat intolerance, polydipsia, polyphagia and polyuria.  Genitourinary: Negative.   Musculoskeletal: Positive for arthralgias. Negative for back pain, gait problem, joint swelling, myalgias, neck pain and neck stiffness.  Skin: Negative.   Allergic/Immunologic: Negative.   Neurological: Negative.  Negative for tremors.  Hematological: Negative.   Negative for adenopathy. Does not bruise/bleed easily.  Psychiatric/Behavioral: Negative.        Objective:   Physical Exam  Vitals reviewed. Constitutional: She is oriented to person, place, and time. She appears well-developed and well-nourished. No distress.  HENT:  Head: Normocephalic and atraumatic.  Mouth/Throat: Oropharynx is clear and moist. No oropharyngeal exudate.  Eyes: Conjunctivae are normal. Right eye exhibits no discharge. Left eye exhibits no discharge. No scleral icterus.  Neck: Normal range of motion. Neck supple. No JVD present. No tracheal deviation present. No thyromegaly present.  Cardiovascular: Normal rate, regular rhythm, normal heart sounds and intact distal pulses.  Exam reveals no gallop and no friction rub.   No murmur heard. Pulmonary/Chest: Effort normal and breath sounds normal. No stridor. No respiratory distress. She has no wheezes. She has no rales. She exhibits no tenderness.  Abdominal: Soft. Bowel sounds are normal. She exhibits no distension and no mass. There is no tenderness. There is no rebound and no guarding.  Musculoskeletal: Normal range of motion. She exhibits no edema and no tenderness.       Right shoulder: She exhibits bony tenderness (over the GT). She exhibits normal range of motion, no tenderness, no swelling, no effusion, no crepitus, no deformity, no laceration, no pain, no spasm, normal pulse and normal strength.  Lymphadenopathy:    She has no cervical adenopathy.  Neurological: She is alert and oriented to person, place, and time. She displays no atrophy and normal reflexes. No cranial nerve deficit or sensory deficit. She exhibits normal muscle tone. She displays no seizure activity. Gait abnormal. Coordination normal.  She waddles when she walks  Skin: Skin is warm and dry. No rash noted. She is not diaphoretic. No erythema. No pallor.  Lab Results  Component Value Date   WBC 7.3 03/08/2013   HGB 13.9 03/08/2013   HCT  40.4 03/08/2013   PLT 276.0 03/08/2013   GLUCOSE 107* 03/08/2013   CHOL 251* 03/08/2013   TRIG 265.0* 03/08/2013   HDL 36.20* 03/08/2013   LDLDIRECT 179.4 03/08/2013   ALT 15 03/08/2013   AST 19 03/08/2013   NA 138 03/08/2013   K 3.8 03/08/2013   CL 105 03/08/2013   CREATININE 1.8* 03/08/2013   BUN 26* 03/08/2013   CO2 26 03/08/2013   TSH 2.64 03/08/2013   HGBA1C 6.2 11/19/2012   MICROALBUR 8.3* 06/25/2008        Assessment & Plan:

## 2013-06-12 NOTE — Assessment & Plan Note (Signed)
I will recheck her A1C and will advise further

## 2013-06-12 NOTE — Assessment & Plan Note (Signed)
She will avoid nsaids I will monitor her renal function today

## 2013-06-12 NOTE — Assessment & Plan Note (Signed)
I have asked her to restart crestor

## 2013-06-12 NOTE — Assessment & Plan Note (Signed)
I will recheck her TSH and will adjust her dose if needed 

## 2013-06-26 ENCOUNTER — Ambulatory Visit (INDEPENDENT_AMBULATORY_CARE_PROVIDER_SITE_OTHER): Payer: Medicare Other | Admitting: Family Medicine

## 2013-06-26 ENCOUNTER — Encounter: Payer: Self-pay | Admitting: Family Medicine

## 2013-06-26 VITALS — BP 122/82 | HR 79 | Wt 206.0 lb

## 2013-06-26 DIAGNOSIS — M76899 Other specified enthesopathies of unspecified lower limb, excluding foot: Secondary | ICD-10-CM

## 2013-06-26 DIAGNOSIS — M7062 Trochanteric bursitis, left hip: Secondary | ICD-10-CM | POA: Insufficient documentation

## 2013-06-26 NOTE — Patient Instructions (Signed)
Very nice to meet you Ice 20 minutes 2 times a day can help exercsies 3 times a week.  Take tylenol 650 mg three times a day is the best evidence based medicine we have for arthritis.  Glucosamine sulfate 750mg  twice a day is a supplement that has been shown to help moderate to severe arthritis. Vitamin D 2000 IU daily Fish oil 2 grams daily.  Tumeric 500mg  twice daily.  Capsaicin topically up to four times a day may also help with pain. Water aerobics and cycling with low resistance are the best two types of exercise for arthritis. Come back and see me in 4  weeks. If doing well we will do injection in the knot.

## 2013-06-26 NOTE — Progress Notes (Signed)
[facility] [address], [place] [address] Phone: [phone] Subjective:    I'm seeing this patient by the request  of:  Scarlette Calico, MD   CC: left hip pain.   [id number] GHIANNA DEVOL is a 80 y.o. female coming in with complaint of left hip pain. Patient has had this pain for multiple years but it seems to be increasing. Patient states she fell 2 months ago since then has had increasing pain. Patient has been seeing a chiropractor who did get x-rays of this left hip she states that did show arthritis but otherwise no other significant findings. She has been taking Tylenol and patient is very hesitant to take any other medications. Patient describes the pain as severe with worsening with ambulation as well as standing long amount of time or any direct palpation over the lateral aspect of the hip. Patient states he can radiate down her leg. Patient also has chronic back pain but states at this seems to be different. Patient denies any significant groin pain but has been told that she does have arthritis of the hip. Patient states that there is nighttime awakening secondary to the pain when she rolls onto the lateral aspect of her hip. Patient is a severity of 9/10.     Past medical history, social, surgical and family history all reviewed in electronic medical record.   Review of Systems: No headache, visual changes, nausea, vomiting, diarrhea, constipation, dizziness, abdominal pain, skin rash, fevers, chills, night sweats, weight loss, swollen lymph nodes, body aches, joint swelling, muscle aches, chest pain, shortness of breath, mood changes.   Objective Blood pressure 122/82, pulse 79, weight 206 lb (93.441 kg), SpO2 98.00%.  General: No apparent distress alert and oriented x3 mood and affect normal, dressed appropriately.  HEENT: Pupils equal, extraocular movements intact  Respiratory: Patient's speak in full sentences and does not appear  short of breath  Cardiovascular: No lower extremity edema, non tender, no erythema  Skin: Warm dry intact with no signs of infection or rash on extremities or on axial skeleton.  Abdomen: Soft nontender  Neuro: Cranial nerves II through XII are intact, neurovascularly intact in all extremities with 2+ DTRs and 2+ pulses.  Lymph: No lymphadenopathy of posterior or anterior cervical chain or axillae bilaterally.  Gait normal with good balance and coordination.  MSK:  Non tender with full range of motion and good stability and symmetric strength and tone of shoulders, elbows, wrist,  and ankles bilaterally. Osteoarthritic changes in multiple joints Patient has bilateral knee replacements Hip: Left ROM IR: 25 Deg, ER: 45 Deg, Flexion: 100 Deg, Extension: 80 Deg, Abduction: 45 Deg, Adduction: 35 Deg Strength IR: 5/5, ER: 5/5, Flexion: 5/5, Extension: 5/5, Abduction: 3/5, Adduction: 5/5 Pelvic alignment unremarkable to inspection and palpation. Standing hip rotation and gait without trendelenburg sign / unsteadiness. Greater trochanter without tenderness to palpation. No tenderness over piriformis and greater trochanter. Positive Faber Mild SI joint tenderness and normal minimal SI movement. Patient does have paraspinal musculature tenderness of the lumbar spine on the left side but a negative straight leg test. Neurovascular intact distally with 2+ distal reflexes.   Procedure: Real-time Ultrasound Guided Injection of left  greater trochanteric bursitis secondary to patient's body habitus Device: GE Logiq E  Ultrasound guided injection is preferred based studies that show increased duration, increased effect, greater accuracy, decreased procedural pain, increased response rate, and decreased cost with ultrasound guided versus blind injection.  Verbal informed consent  obtained.  Time-out conducted.  Noted no overlying erythema, induration, or other signs of local infection.  Skin prepped in a  sterile fashion.  Local anesthesia: Topical Ethyl chloride.  With sterile technique and under real time ultrasound guidance:  Greater trochanteric area was visualized and patient's bursa was noted. A 22-gauge 3 inch needle was inserted and 4 cc of 0.5% Marcaine and 1 cc of Kenalog 40 mg/dL was injected. Pictures taken Completed without difficulty  Pain immediately resolved suggesting accurate placement of the medication.  Advised to call if fevers/chills, erythema, induration, drainage, or persistent bleeding.  Images permanently stored and available for review in the ultrasound unit.  Impression: Technically successful ultrasound guided injection.     Impression and Recommendations:     This case required medical decision making of moderate complexity.

## 2013-06-26 NOTE — Assessment & Plan Note (Signed)
Patient had injection today and had complete resolution of pain. Patient I do think does probably have degenerative disc disease of the back that is also given her radicular symptoms but I do not think that this is the primary cause of her pain. Patient told about icing as was given home exercise program and we will compensate secondary to patient's comorbidity. Patient will try these interventions as well as over-the-counter medications are to be beneficial. Patient will come back again in 4-6 weeks for further evaluation. The patient continues have pain we need to consider formal physical therapy as well as getting x-rays for further information.

## 2013-07-18 ENCOUNTER — Ambulatory Visit (INDEPENDENT_AMBULATORY_CARE_PROVIDER_SITE_OTHER): Payer: Medicare Other | Admitting: Internal Medicine

## 2013-07-18 ENCOUNTER — Encounter: Payer: Self-pay | Admitting: Internal Medicine

## 2013-07-18 ENCOUNTER — Other Ambulatory Visit (INDEPENDENT_AMBULATORY_CARE_PROVIDER_SITE_OTHER): Payer: Medicare Other

## 2013-07-18 ENCOUNTER — Telehealth: Payer: Self-pay | Admitting: Gastroenterology

## 2013-07-18 VITALS — BP 114/80 | HR 101 | Temp 98.3°F | Resp 16 | Ht 61.0 in | Wt 199.0 lb

## 2013-07-18 DIAGNOSIS — K219 Gastro-esophageal reflux disease without esophagitis: Secondary | ICD-10-CM

## 2013-07-18 DIAGNOSIS — B354 Tinea corporis: Secondary | ICD-10-CM | POA: Insufficient documentation

## 2013-07-18 DIAGNOSIS — N184 Chronic kidney disease, stage 4 (severe): Secondary | ICD-10-CM

## 2013-07-18 DIAGNOSIS — I10 Essential (primary) hypertension: Secondary | ICD-10-CM

## 2013-07-18 DIAGNOSIS — G4762 Sleep related leg cramps: Secondary | ICD-10-CM | POA: Insufficient documentation

## 2013-07-18 LAB — CBC WITH DIFFERENTIAL/PLATELET
Basophils Absolute: 0 10*3/uL (ref 0.0–0.1)
Basophils Relative: 0.3 % (ref 0.0–3.0)
EOS PCT: 0.7 % (ref 0.0–5.0)
Eosinophils Absolute: 0.1 10*3/uL (ref 0.0–0.7)
HCT: 44.1 % (ref 36.0–46.0)
Hemoglobin: 14.9 g/dL (ref 12.0–15.0)
LYMPHS PCT: 19.2 % (ref 12.0–46.0)
Lymphs Abs: 2.1 10*3/uL (ref 0.7–4.0)
MCHC: 33.7 g/dL (ref 30.0–36.0)
MCV: 97.3 fl (ref 78.0–100.0)
Monocytes Absolute: 0.8 10*3/uL (ref 0.1–1.0)
Monocytes Relative: 6.9 % (ref 3.0–12.0)
NEUTROS PCT: 72.9 % (ref 43.0–77.0)
Neutro Abs: 7.9 10*3/uL — ABNORMAL HIGH (ref 1.4–7.7)
PLATELETS: 262 10*3/uL (ref 150.0–400.0)
RBC: 4.53 Mil/uL (ref 3.87–5.11)
RDW: 13.9 % (ref 11.5–15.5)
WBC: 10.9 10*3/uL — AB (ref 4.0–10.5)

## 2013-07-18 MED ORDER — KETOCONAZOLE 2 % EX CREA: 1.0000 "application " | TOPICAL_CREAM | Freq: Two times a day (BID) | CUTANEOUS | Status: DC

## 2013-07-18 MED ORDER — PANTOPRAZOLE SODIUM 40 MG PO TBEC: DELAYED_RELEASE_TABLET | ORAL | Status: DC

## 2013-07-18 MED ORDER — GABAPENTIN 100 MG PO CAPS: 100.0000 mg | ORAL_CAPSULE | Freq: Every day | ORAL | Status: DC

## 2013-07-18 NOTE — Patient Instructions (Signed)
Gastroesophageal Reflux Disease, Adult  Gastroesophageal reflux disease (GERD) happens when acid from your stomach flows up into the esophagus. When acid comes in contact with the esophagus, the acid causes soreness (inflammation) in the esophagus. Over time, GERD may create small holes (ulcers) in the lining of the esophagus.  CAUSES   · Increased body weight. This puts pressure on the stomach, making acid rise from the stomach into the esophagus.  · Smoking. This increases acid production in the stomach.  · Drinking alcohol. This causes decreased pressure in the lower esophageal sphincter (valve or ring of muscle between the esophagus and stomach), allowing acid from the stomach into the esophagus.  · Late evening meals and a full stomach. This increases pressure and acid production in the stomach.  · A malformed lower esophageal sphincter.  Sometimes, no cause is found.  SYMPTOMS   · Burning pain in the lower part of the mid-chest behind the breastbone and in the mid-stomach area. This may occur twice a week or more often.  · Trouble swallowing.  · Sore throat.  · Dry cough.  · Asthma-like symptoms including chest tightness, shortness of breath, or wheezing.  DIAGNOSIS   Your caregiver may be able to diagnose GERD based on your symptoms. In some cases, X-rays and other tests may be done to check for complications or to check the condition of your stomach and esophagus.  TREATMENT   Your caregiver may recommend over-the-counter or prescription medicines to help decrease acid production. Ask your caregiver before starting or adding any new medicines.   HOME CARE INSTRUCTIONS   · Change the factors that you can control. Ask your caregiver for guidance concerning weight loss, quitting smoking, and alcohol consumption.  · Avoid foods and drinks that make your symptoms worse, such as:  · Caffeine or alcoholic drinks.  · Chocolate.  · Peppermint or mint flavorings.  · Garlic and onions.  · Spicy foods.  · Citrus fruits,  such as oranges, lemons, or limes.  · Tomato-based foods such as sauce, chili, salsa, and pizza.  · Fried and fatty foods.  · Avoid lying down for the 3 hours prior to your bedtime or prior to taking a nap.  · Eat small, frequent meals instead of large meals.  · Wear loose-fitting clothing. Do not wear anything tight around your waist that causes pressure on your stomach.  · Raise the head of your bed 6 to 8 inches with wood blocks to help you sleep. Extra pillows will not help.  · Only take over-the-counter or prescription medicines for pain, discomfort, or fever as directed by your caregiver.  · Do not take aspirin, ibuprofen, or other nonsteroidal anti-inflammatory drugs (NSAIDs).  SEEK IMMEDIATE MEDICAL CARE IF:   · You have pain in your arms, neck, jaw, teeth, or back.  · Your pain increases or changes in intensity or duration.  · You develop nausea, vomiting, or sweating (diaphoresis).  · You develop shortness of breath, or you faint.  · Your vomit is green, yellow, black, or looks like coffee grounds or blood.  · Your stool is red, bloody, or black.  These symptoms could be signs of other problems, such as heart disease, gastric bleeding, or esophageal bleeding.  MAKE SURE YOU:   · Understand these instructions.  · Will watch your condition.  · Will get help right away if you are not doing well or get worse.  Document Released: 12/08/2004 Document Revised: 05/23/2011 Document Reviewed: 09/17/2010  [employer]® Patient   Information ©2014 [employer].

## 2013-07-18 NOTE — Progress Notes (Signed)
Subjective:    Patient ID: Yesenia [name], female    DOB: 22-Jun-1929, 80 y.o.   MRN: [id number]  Gastrophageal Reflux She complains of abdominal pain, belching, early satiety, heartburn and nausea. She reports no chest pain, no choking, no coughing, no dysphagia, no globus sensation, no sore throat, no stridor, no tooth decay, no water brash or no wheezing. This is a chronic problem. The problem has been gradually worsening. The heartburn does not wake her from sleep. The heartburn does not limit her activity. The heartburn doesn't change with position. The symptoms are aggravated by certain foods. Associated symptoms include fatigue, muscle weakness and weight loss. Pertinent negatives include no anemia, melena or orthopnea. She has tried nothing (she tells me that her insurance has refused to pay for a ppi) for the symptoms. The treatment provided no relief. Past procedures include an EGD.      Review of Systems  Constitutional: Positive for weight loss and fatigue. Negative for fever, chills, diaphoresis, activity change, appetite change and unexpected weight change.  HENT: Negative.  Negative for sore throat, trouble swallowing and voice change.   Eyes: Negative.   Respiratory: Negative.  Negative for cough, choking, chest tightness, shortness of breath, wheezing and stridor.   Cardiovascular: Negative.  Negative for chest pain, palpitations and leg swelling.  Gastrointestinal: Positive for heartburn, nausea and abdominal pain. Negative for dysphagia, vomiting, diarrhea, constipation, blood in stool, melena, abdominal distention, anal bleeding and rectal pain.  Endocrine: Negative.   Genitourinary: Negative.   Musculoskeletal: Positive for muscle weakness. Negative for arthralgias, back pain, gait problem, joint swelling, myalgias and neck stiffness.       She has cramps in her legs and feet at night  Skin: Positive for rash (under both breasts and bilateral groin). Negative for color  change, pallor and wound.  Allergic/Immunologic: Negative.   Neurological: Negative.   Hematological: Negative.  Negative for adenopathy. Does not bruise/bleed easily.  Psychiatric/Behavioral: Negative.        Objective:   Physical Exam  Vitals reviewed. Constitutional: She is oriented to person, place, and time. She appears well-developed and well-nourished.  Non-toxic appearance. She does not have a sickly appearance. She does not appear ill. No distress.  HENT:  Head: Normocephalic and atraumatic.  Mouth/Throat: Oropharynx is clear and moist. No oropharyngeal exudate.  Eyes: Conjunctivae are normal. Right eye exhibits no discharge. Left eye exhibits no discharge. No scleral icterus.  Neck: Normal range of motion. Neck supple. No JVD present. No tracheal deviation present. No thyromegaly present.  Cardiovascular: Normal rate, regular rhythm, normal heart sounds and intact distal pulses.  Exam reveals no gallop and no friction rub.   No murmur heard. Pulses:      Carotid pulses are 1+ on the right side, and 1+ on the left side.      Radial pulses are 1+ on the right side, and 1+ on the left side.       Femoral pulses are 1+ on the right side, and 1+ on the left side.      Popliteal pulses are 1+ on the right side, and 1+ on the left side.       Dorsalis pedis pulses are 1+ on the right side, and 1+ on the left side.       Posterior tibial pulses are 1+ on the right side, and 1+ on the left side.  Pulmonary/Chest: Effort normal and breath sounds normal. No stridor. No respiratory distress. She has no wheezes. She  has no rales. She exhibits no tenderness.  Abdominal: Soft. Normal appearance and bowel sounds are normal. She exhibits no distension and no mass. There is no hepatosplenomegaly, splenomegaly or hepatomegaly. There is no tenderness. There is no rebound, no guarding and no CVA tenderness.  Genitourinary: Rectal exam shows no external hemorrhoid, no internal hemorrhoid, no fissure,  no mass, no tenderness and anal tone normal. Guaiac negative stool.  Musculoskeletal: Normal range of motion. She exhibits no edema and no tenderness.  Lymphadenopathy:    She has no cervical adenopathy.  Neurological: She is oriented to person, place, and time.  Skin: Skin is warm and dry. No rash noted. She is not diaphoretic. No erythema. No pallor.     Psychiatric: She has a normal mood and affect. Her behavior is normal. Judgment and thought content normal.     Lab Results  Component Value Date   WBC 7.3 03/08/2013   HGB 13.9 03/08/2013   HCT 40.4 03/08/2013   PLT 276.0 03/08/2013   GLUCOSE 105* 06/12/2013   CHOL 271* 06/12/2013   TRIG 229.0* 06/12/2013   HDL 38.60* 06/12/2013   LDLDIRECT 179.4 03/08/2013   LDLCALC 187* 06/12/2013   ALT 15 03/08/2013   AST 19 03/08/2013   NA 137 06/12/2013   K 4.3 06/12/2013   CL 100 06/12/2013   CREATININE 1.8* 06/12/2013   BUN 27* 06/12/2013   CO2 29 06/12/2013   TSH 1.46 06/12/2013   HGBA1C 6.2 06/12/2013   MICROALBUR 8.3* 06/25/2008       Assessment & Plan:

## 2013-07-18 NOTE — Progress Notes (Signed)
Pre visit review using our clinic review tool, if applicable. No additional management support is needed unless otherwise documented below in the visit note. 

## 2013-07-18 NOTE — Telephone Encounter (Signed)
Patient states she had seen Dr. Sharlett Iles for GERD and wants to see another MD for this now. She prefers Dr. Ardis Hughs.Spoke with patient and told her first available with Dr. Ardis Hughs is 09/10/13 at 2:15 PM. Offered OV with extender but she declines this. Scheduled her on 09/10/13 at 2:15 PM with Dr.Jacobs. Put her on wait list also.

## 2013-07-19 NOTE — Assessment & Plan Note (Signed)
Will try neurontin for this

## 2013-07-19 NOTE — Assessment & Plan Note (Signed)
I have asked her to restart the PPI I will check her labs today to see if she is anemic I have asked her to f/up with GI as she seems to be due for an upper endoscopy

## 2013-07-19 NOTE — Assessment & Plan Note (Signed)
Will treat with ketoconazole cream 

## 2013-07-19 NOTE — Assessment & Plan Note (Signed)
Her renal function is stable 

## 2013-07-19 NOTE — Assessment & Plan Note (Signed)
Her BP is well controlled 

## 2013-07-22 ENCOUNTER — Other Ambulatory Visit (INDEPENDENT_AMBULATORY_CARE_PROVIDER_SITE_OTHER): Payer: Medicare Other

## 2013-07-22 ENCOUNTER — Other Ambulatory Visit: Payer: Self-pay | Admitting: Internal Medicine

## 2013-07-22 DIAGNOSIS — D72829 Elevated white blood cell count, unspecified: Secondary | ICD-10-CM | POA: Insufficient documentation

## 2013-07-22 LAB — CBC WITH DIFFERENTIAL/PLATELET
BASOS PCT: 0.6 % (ref 0.0–3.0)
Basophils Absolute: 0 10*3/uL (ref 0.0–0.1)
Eosinophils Absolute: 0.1 10*3/uL (ref 0.0–0.7)
Eosinophils Relative: 1.4 % (ref 0.0–5.0)
HCT: 46 % (ref 36.0–46.0)
HEMOGLOBIN: 15.7 g/dL — AB (ref 12.0–15.0)
Lymphocytes Relative: 29.2 % (ref 12.0–46.0)
Lymphs Abs: 2.5 10*3/uL (ref 0.7–4.0)
MCHC: 34 g/dL (ref 30.0–36.0)
MCV: 96.8 fl (ref 78.0–100.0)
Monocytes Absolute: 0.7 10*3/uL (ref 0.1–1.0)
Monocytes Relative: 7.7 % (ref 3.0–12.0)
NEUTROS ABS: 5.2 10*3/uL (ref 1.4–7.7)
NEUTROS PCT: 61.1 % (ref 43.0–77.0)
Platelets: 303 10*3/uL (ref 150.0–400.0)
RBC: 4.76 Mil/uL (ref 3.87–5.11)
RDW: 13.5 % (ref 11.5–15.5)
WBC: 8.5 10*3/uL (ref 4.0–10.5)

## 2013-07-22 LAB — SEDIMENTATION RATE: SED RATE: 19 mm/h (ref 0–22)

## 2013-07-22 LAB — HM MAMMOGRAPHY: HM Mammogram: NORMAL

## 2013-07-24 ENCOUNTER — Ambulatory Visit (INDEPENDENT_AMBULATORY_CARE_PROVIDER_SITE_OTHER)
Admission: RE | Admit: 2013-07-24 | Discharge: 2013-07-24 | Disposition: A | Discharge status: Home / Self Care | Payer: Medicare Other | Source: Ambulatory Visit | Attending: Family Medicine | Admitting: Family Medicine

## 2013-07-24 ENCOUNTER — Ambulatory Visit (INDEPENDENT_AMBULATORY_CARE_PROVIDER_SITE_OTHER): Payer: Medicare Other | Admitting: Family Medicine

## 2013-07-24 ENCOUNTER — Encounter: Payer: Self-pay | Admitting: Family Medicine

## 2013-07-24 VITALS — BP 132/84 | HR 89

## 2013-07-24 DIAGNOSIS — M549 Dorsalgia, unspecified: Secondary | ICD-10-CM

## 2013-07-24 DIAGNOSIS — IMO0002 Reserved for concepts with insufficient information to code with codable children: Secondary | ICD-10-CM

## 2013-07-24 DIAGNOSIS — M5416 Radiculopathy, lumbar region: Secondary | ICD-10-CM

## 2013-07-24 LAB — SPEP & IFE WITH QIG
Albumin ELP: 56.4 % (ref 55.8–66.1)
Alpha-1-Globulin: 3.9 % (ref 2.9–4.9)
Alpha-2-Globulin: 13.6 % — ABNORMAL HIGH (ref 7.1–11.8)
Beta 2: 6.1 % (ref 3.2–6.5)
Beta Globulin: 6.7 % (ref 4.7–7.2)
Gamma Globulin: 13.3 % (ref 11.1–18.8)
IGA: 311 mg/dL (ref 69–380)
IGM, SERUM: 176 mg/dL (ref 52–322)
IgG (Immunoglobin G), Serum: 938 mg/dL (ref 690–1700)
Total Protein, Serum Electrophoresis: 7 g/dL (ref 6.0–8.3)

## 2013-07-24 MED ORDER — NORTRIPTYLINE HCL 10 MG PO CAPS: 10.0000 mg | ORAL_CAPSULE | Freq: Every day | ORAL | Status: DC

## 2013-07-24 NOTE — Patient Instructions (Addendum)
I would like to get xrays today.  Ice 20 minutes 2 times a day Back exercises I am giving you 3 times a week.  Turmeric 500mg  twice daily CoQ10 daily can help with muscle spasm.  Nortriptyline 1 pill nightly. Try these cahnges and come back in 3 weeks.  If pain worsens before you see me give me a call and if pain is worse we may need to consider a CT scan of your back.

## 2013-07-24 NOTE — Progress Notes (Signed)
  [facility] [address] [place], [place] [address] Phone: [phone] Subjective:     CC: left hip pain follow up   [id number] Yesenia [name] is a 80 y.o. female coming in with complaint of left hip pain. Patient does have some mild to moderate osteoarthritic changes of the left hip as well as a greater trochanteric bursitis. Patient was given an injection at last visit 4 weeks ago and was given home exercise program. Patient also was to start over-the-counter medications and we avoided anti-inflammatories secondary to patient's chronic kidney disease. Patient states overall the pain seemed to come back. Patient states that the spot in her buttocks region is still there and she is having worsening back pain. Patient states that it is becoming even more difficult to ambulate on a regular basis secondary to pain. Patient has been any significant improvement. Patient did not get immediate over-the-counter medications we suggested previously. Patient has not been doing the exercises on a regular basis either. States that the pain is 9/10 and finds it very difficult to find a comfortable position. Patient states that she is also having cramping in her legs bilaterally that seemed to occur after walking a long amount of distance or when she tries to lay down flat in bed.     Past medical history, social, surgical and family history all reviewed in electronic medical record.   Review of Systems: No headache, visual changes, nausea, vomiting, diarrhea, constipation, dizziness, abdominal pain, skin rash, fevers, chills, night sweats, weight loss, swollen lymph nodes, body aches, joint swelling, muscle aches, chest pain, shortness of breath, mood changes.   Objective Blood pressure 132/84, pulse 89, SpO2 94.00%.  General: No apparent distress alert and oriented x3 mood and affect normal, dressed appropriately. Beese HEENT: Pupils equal, extraocular movements intact    Respiratory: Patient's speak in full sentences and does not appear short of breath  Cardiovascular: No lower extremity edema, non tender, no erythema  Skin: Warm dry intact with no signs of infection or rash on extremities or on axial skeleton.  Abdomen: Soft nontender  Neuro: Cranial nerves II through XII are intact, neurovascularly intact in all extremities with 2+ DTRs and 2+ pulses.  Lymph: No lymphadenopathy of posterior or anterior cervical chain or axillae bilaterally.  Gait  patient ambulates with a antalgic gait with severe external rotation of the legs bilaterally MSK:  Non tender with full range of motion and good stability and symmetric strength and tone of shoulders, elbows, wrist,  and ankles bilaterally. Osteoarthritic changes in multiple joints Patient has bilateral knee replacementspatient also has scoliosis of the back.  Hip: Left ROM IR: 25 Deg, ER: 45 Deg, Flexion: 100 Deg, Extension: 80 Deg, Abduction: 45 Deg, Adduction: 35 Deg Strength IR: 5/5, ER: 5/5, Flexion: 5/5, Extension: 5/5, Abduction: 3/5, Adduction: 5/5 Pelvic alignment unremarkable to inspection and palpation. Standing hip rotation and gait without trendelenburg sign / unsteadiness. Greater trochanter without tenderness to palpation. No tenderness over piriformis and greater trochanter. Positive Faber mild positive straight leg test on the left side.  Mild SI joint tenderness and normal minimal SI movement. Patient does have paraspinal musculature tenderness of the lumbar spine on the left side but a negative straight leg test. Neurovascular intact distally with 2+ distal reflexes.    Impression and Recommendations:     This case required medical decision making of moderate complexity.

## 2013-07-24 NOTE — Assessment & Plan Note (Signed)
With the patient's the pain is likely lumbar radiculopathy based on patient's physical exam today. Patient will get x-rays today. I do think she has severe osteoarthritic changes in likely spinal stenosis that is giving her some compression. Patient will need to do home exercises that have been given to her as well as an icing protocol. We discussed over-the-counter medications that can help her with her leg spasming and did want to start her on I nerve modulator. Patient has had difficulties with gabapentin in the past and patient was given nortriptyline 10 mg dosing to try. Warned of potential side effects. Patient will try this intervention and come back again in 2-3 weeks. The patient continues to have difficulties I would like to get a CT scan of the back to evaluate for possible epidural steroid injections.  Spent greater than 25 minutes with patient face-to-face and had greater than 50% of counseling including as described above in assessment and plan.

## 2013-07-30 ENCOUNTER — Telehealth: Payer: Self-pay | Admitting: Internal Medicine

## 2013-07-30 NOTE — Telephone Encounter (Signed)
Pt called request x-ray result that was done 07/24/13. Please call pt

## 2013-07-30 NOTE — Telephone Encounter (Signed)
Please call patient and tell her it is what we expected with severe arthritis. No change in m anagement and patient should probably be comingin in the next 2-3 weeks.

## 2013-07-31 NOTE — Telephone Encounter (Signed)
Discussed with pt

## 2013-08-08 ENCOUNTER — Ambulatory Visit (INDEPENDENT_AMBULATORY_CARE_PROVIDER_SITE_OTHER): Payer: Medicare Other | Admitting: Internal Medicine

## 2013-08-08 ENCOUNTER — Encounter: Payer: Self-pay | Admitting: Internal Medicine

## 2013-08-08 VITALS — BP 144/86 | HR 87 | Temp 98.2°F | Resp 16 | Ht 61.0 in | Wt 201.0 lb

## 2013-08-08 DIAGNOSIS — I10 Essential (primary) hypertension: Secondary | ICD-10-CM

## 2013-08-08 DIAGNOSIS — IMO0002 Reserved for concepts with insufficient information to code with codable children: Secondary | ICD-10-CM

## 2013-08-08 DIAGNOSIS — M1990 Unspecified osteoarthritis, unspecified site: Secondary | ICD-10-CM

## 2013-08-08 DIAGNOSIS — G4762 Sleep related leg cramps: Secondary | ICD-10-CM

## 2013-08-08 DIAGNOSIS — M7062 Trochanteric bursitis, left hip: Secondary | ICD-10-CM

## 2013-08-08 DIAGNOSIS — M76899 Other specified enthesopathies of unspecified lower limb, excluding foot: Secondary | ICD-10-CM

## 2013-08-08 DIAGNOSIS — M5416 Radiculopathy, lumbar region: Secondary | ICD-10-CM

## 2013-08-08 MED ORDER — TRAMADOL HCL 50 MG PO TABS: 50.0000 mg | ORAL_TABLET | Freq: Three times a day (TID) | ORAL | Status: DC | PRN

## 2013-08-08 NOTE — Progress Notes (Signed)
   Subjective:    Patient ID: Yesenia [name], female    DOB: July 05, 1929, 80 y.o.   MRN: [id number]  Arthritis Presents for follow-up visit. She complains of pain. She reports no stiffness, joint swelling or joint warmth. Affected locations include the left hip. Her pain is at a severity of 2/10. Associated symptoms include pain at night and pain while resting. Pertinent negatives include no diarrhea, dry eyes, dry mouth, dysuria, fatigue, fever, rash, Raynaud's syndrome, uveitis or weight loss. Her pertinent risk factors include overuse. Past treatments include acetaminophen. The treatment provided mild relief. Compliance with prior treatments has been good.      Review of Systems  Constitutional: Negative.  Negative for fever, chills, weight loss, diaphoresis, appetite change and fatigue.  HENT: Negative.   Eyes: Negative.   Respiratory: Negative.  Negative for cough, choking, chest tightness, shortness of breath and stridor.   Cardiovascular: Negative.  Negative for chest pain, palpitations and leg swelling.  Gastrointestinal: Negative.  Negative for nausea, vomiting, abdominal pain, diarrhea and constipation.  Endocrine: Negative.   Genitourinary: Negative.  Negative for dysuria.  Musculoskeletal: Positive for arthralgias, arthritis and back pain. Negative for gait problem, joint swelling, myalgias, neck pain, neck stiffness and stiffness.  Skin: Negative.  Negative for rash.  Allergic/Immunologic: Negative.   Neurological: Negative.   Hematological: Negative.  Negative for adenopathy. Does not bruise/bleed easily.  Psychiatric/Behavioral: Negative.        Objective:   Physical Exam  Vitals reviewed. Constitutional: She is oriented to person, place, and time. She appears well-developed and well-nourished. No distress.  HENT:  Head: Normocephalic and atraumatic.  Mouth/Throat: Oropharynx is clear and moist. No oropharyngeal exudate.  Eyes: Conjunctivae are normal. Right eye  exhibits no discharge. Left eye exhibits no discharge. No scleral icterus.  Neck: Normal range of motion. Neck supple. No JVD present. No tracheal deviation present. No thyromegaly present.  Cardiovascular: Normal rate, regular rhythm, normal heart sounds and intact distal pulses.  Exam reveals no gallop and no friction rub.   No murmur heard. Pulmonary/Chest: Effort normal and breath sounds normal. No stridor. No respiratory distress. She has no wheezes. She has no rales. She exhibits no tenderness.  Abdominal: Soft. Bowel sounds are normal. She exhibits no distension and no mass. There is no tenderness. There is no rebound and no guarding.  Musculoskeletal: Normal range of motion. She exhibits no edema.  Lymphadenopathy:    She has no cervical adenopathy.  Neurological: She is oriented to person, place, and time.  Skin: Skin is warm and dry. No rash noted. She is not diaphoretic. No erythema. No pallor.     Lab Results  Component Value Date   WBC 8.5 07/22/2013   HGB 15.7* 07/22/2013   HCT 46.0 07/22/2013   PLT 303.0 07/22/2013   GLUCOSE 105* 06/12/2013   CHOL 271* 06/12/2013   TRIG 229.0* 06/12/2013   HDL 38.60* 06/12/2013   LDLDIRECT 179.4 03/08/2013   LDLCALC 187* 06/12/2013   ALT 15 03/08/2013   AST 19 03/08/2013   NA 137 06/12/2013   K 4.3 06/12/2013   CL 100 06/12/2013   CREATININE 1.8* 06/12/2013   BUN 27* 06/12/2013   CO2 29 06/12/2013   TSH 1.46 06/12/2013   HGBA1C 6.2 06/12/2013   MICROALBUR 8.3* 06/25/2008        Assessment & Plan:

## 2013-08-08 NOTE — Progress Notes (Signed)
Pre visit review using our clinic review tool, if applicable. No additional management support is needed unless otherwise documented below in the visit note. 

## 2013-08-09 ENCOUNTER — Encounter: Payer: Self-pay | Admitting: Internal Medicine

## 2013-08-09 NOTE — Assessment & Plan Note (Signed)
Her BP is adequately well controlled 

## 2013-08-09 NOTE — Assessment & Plan Note (Signed)
She will try tramadol for the pain 

## 2013-08-14 ENCOUNTER — Ambulatory Visit (INDEPENDENT_AMBULATORY_CARE_PROVIDER_SITE_OTHER): Payer: Medicare Other | Admitting: Family Medicine

## 2013-08-14 ENCOUNTER — Encounter: Payer: Self-pay | Admitting: Family Medicine

## 2013-08-14 VITALS — BP 132/82 | HR 90 | Ht 62.0 in | Wt 198.0 lb

## 2013-08-14 DIAGNOSIS — M25552 Pain in left hip: Secondary | ICD-10-CM

## 2013-08-14 DIAGNOSIS — M5416 Radiculopathy, lumbar region: Secondary | ICD-10-CM

## 2013-08-14 DIAGNOSIS — M25559 Pain in unspecified hip: Secondary | ICD-10-CM

## 2013-08-14 DIAGNOSIS — IMO0002 Reserved for concepts with insufficient information to code with codable children: Secondary | ICD-10-CM

## 2013-08-14 MED ORDER — METHYLPREDNISOLONE ACETATE 80 MG/ML IJ SUSP
80.0000 mg | Freq: Once | INTRAMUSCULAR | Status: AC
  Administered 2013-08-14: 80 mg via INTRAMUSCULAR

## 2013-08-14 MED ORDER — BACLOFEN 10 MG PO TABS: 10.0000 mg | ORAL_TABLET | Freq: Three times a day (TID) | ORAL | Status: DC

## 2013-08-14 MED ORDER — KETOROLAC TROMETHAMINE 60 MG/2ML IM SOLN
60.0000 mg | Freq: Once | INTRAMUSCULAR | Status: AC
  Administered 2013-08-14: 60 mg via INTRAMUSCULAR

## 2013-08-14 NOTE — Assessment & Plan Note (Signed)
Patient is continuing to have pain even though we have tried to monitor with conservative therapy such as medications. Patient is adamant that she does not want to do any physical therapy secondary to how she became injured with the chiropractor. Patient does have underlying severe osteoarthritic changes there likely contributing to her problem and patient is showing some mild increase in weakness of the lower extremities that is of concern. I do believe further imaging is necessary. An MRI was ordered today. Patient will come back one to 2 days after the MRI. In the interim we are also going to do baclofen to see if this will help with some spasm in the patient is having. Patient was given 2 injections today as well to see if this would be beneficial with anti-inflammatory and steroid. Patient will follow up again with me once again after the MRI.  Spent greater than 25 minutes with patient face-to-face and had greater than 50% of counseling including as described above in assessment and plan.

## 2013-08-14 NOTE — Patient Instructions (Signed)
Good to see you Try baclofen up to 3 times daily. But try it at home first to make sure it does not make you sleepy Continue the nortriptyline at night We will get an MRI.  They will call you and set up an appointment Come back 1-2 days after the MRI

## 2013-08-14 NOTE — Progress Notes (Signed)
[name] [address] [place], [place] [address] Phone: [phone] Subjective:     CC: Back pain followup  [id number] Yesenia [name] is a 80 y.o. female coming in with complaint of back pain. Patient's back pain showed that she does have severe degenerative disc disease at multiple levels on x-rays were taken by me last visit. These are reviewed. Patient also is having radicular symptoms going on the posterior and lateral aspects of her leg. Patient was given nortriptyline at last visit and states only minimal improvement. Patient was also given tramadol by primary care provider is having no significant improvement. Patient is a the pain is so bad it is waking her up at night and she is unable to walk without a walker. Patient is still adamant that the chiropractor [place] exacerbation and causes to be significantly worse and she is out of $2000. Patient states that her quality of life is extremely poor at the moment.    Past medical history, social, surgical and family history all reviewed in electronic medical record.   Review of Systems: No headache, visual changes, nausea, vomiting, diarrhea, constipation, dizziness, abdominal pain, skin rash, fevers, chills, night sweats, weight loss, swollen lymph nodes, body aches, joint swelling, muscle aches, chest pain, shortness of breath, mood changes.   Objective Blood pressure 132/82, pulse 90, height 5\' 2"  (1.575 m), weight 198 lb (89.812 kg), SpO2 96.00%.  General: No apparent distress alert and oriented x3 mood and affect normal, dressed appropriately. Beese HEENT: Pupils equal, extraocular movements intact  Respiratory: Patient's speak in full sentences and does not appear short of breath  Cardiovascular: No lower extremity edema, non tender, no erythema  Skin: Warm dry intact with no signs of infection or rash on extremities or on axial skeleton.  Abdomen: Soft nontender  Neuro: Cranial nerves II  through XII are intact, neurovascularly intact in all extremities with 2+ DTRs and 2+ pulses.  Lymph: No lymphadenopathy of posterior or anterior cervical chain or axillae bilaterally.  Gait  patient ambulates with a antalgic gait with severe external rotation of the legs bilaterally MSK:  Non tender with full range of motion and good stability and symmetric strength and tone of shoulders, elbows, wrist,  and ankles bilaterally. Osteoarthritic changes in multiple joints Patient has bilateral knee replacementspatient also has scoliosis of the back.  Hip: Left ROM IR: 25 Deg, ER: 45 Deg, Flexion: 100 Deg, Extension: 80 Deg, Abduction: 45 Deg, Adduction: 35 Deg Strength IR: 5/5, ER: 5/5, Flexion: 4/5, Extension: 5/5, Abduction: 3/5, Adduction: 5/5 Pelvic alignment unremarkable to inspection and palpation. Standing hip rotation and gait without trendelenburg sign / unsteadiness. Greater trochanter without tenderness to palpation. No tenderness over piriformis and greater trochanter. Positive Faber mild positive straight leg test on the left side.  Mild SI joint tenderness and normal minimal SI movement. Patient does have paraspinal musculature tenderness of the lumbar spine on the left side but a negative straight leg test. Neurovascular intact distally with 2+ distal reflexes. Back Exam:  Inspection: Unremarkable  Motion: Flexion 45 deg, Extension 45 deg, Side Bending to 45 deg bilaterally,  Rotation to 45 deg bilaterally  SLR laying: Negative  XSLR laying: Negative  Palpable tenderness: Tender throughout left side paraspinal musculature.Marland Kitchen FABER: negative. Sensory change: Gross sensation intact to all lumbar and sacral dermatomes.  Reflexes: 2+ at both patellar tendons, 2+ at achilles tendons, Babinski's downgoing.  Strength at foot  Plantar-flexion: 5/5 Dorsi-flexion: 5/5 Eversion: 5/5 Inversion: 5/5  Leg strength  Quad: 5/5 Hamstring: 4/5 Hip flexor: 4/5 Hip abductors: 3/5  Gait  unremarkable.    Impression and Recommendations:     This case required medical decision making of moderate complexity.

## 2013-09-02 ENCOUNTER — Telehealth: Payer: Self-pay | Admitting: *Deleted

## 2013-09-02 NOTE — Telephone Encounter (Signed)
Left msg on triage ? Concerning MRI...Yesenia [name]

## 2013-09-03 NOTE — Telephone Encounter (Signed)
Called pt back she stated Dr. Tamala Julian ordered a MRI she has never heard from anyone about the referral. Gave the msg to [facility] to follow-up on referral.../lmb

## 2013-09-04 ENCOUNTER — Other Ambulatory Visit: Payer: Self-pay | Admitting: *Deleted

## 2013-09-04 DIAGNOSIS — M5416 Radiculopathy, lumbar region: Secondary | ICD-10-CM

## 2013-09-10 ENCOUNTER — Ambulatory Visit (INDEPENDENT_AMBULATORY_CARE_PROVIDER_SITE_OTHER): Payer: Medicare Other | Admitting: Gastroenterology

## 2013-09-10 ENCOUNTER — Encounter: Payer: Self-pay | Admitting: Gastroenterology

## 2013-09-10 VITALS — BP 160/90 | HR 98

## 2013-09-10 DIAGNOSIS — K5900 Constipation, unspecified: Secondary | ICD-10-CM

## 2013-09-10 DIAGNOSIS — K219 Gastro-esophageal reflux disease without esophagitis: Secondary | ICD-10-CM

## 2013-09-10 MED ORDER — PANTOPRAZOLE SODIUM 40 MG PO TBEC: 40.0000 mg | DELAYED_RELEASE_TABLET | Freq: Every day | ORAL | Status: DC

## 2013-09-10 NOTE — Progress Notes (Signed)
Review of pertinent gastrointestinal problems: 1. Large HH, EGD Dr. Sharlett Iles 2011 [facility] with free reflux), he recommended fundoplication surgery. 2. Routine risk for colon cancer, colonoscopy 06/2009 Dr. Sharlett Iles; for average risk screening; no polyps, mild left sided diverticulosis.  Colonsocopy Dr. Sharlett Iles 09/2004 for hematochezia was normal. 3. Gallstone disease: Lap chole 2013, January ([place]), ERCP with sphicntertomy stone removal 09/2011 [facility] Ina   HPI: This is a  very pleasant 80 year old woman who I meeting for the first time today. She is a patient of Dr. Buel Ream previously. He has since retired.  Has trouble with GERD-like symptoms.  Ever since ERCP 09/2011.  Pyrosis.  Dips snuff.  She is taking TUMS for pyrosis only.  Protonix was stopped by [employer].  She was only taking PPI, protonix once daily.  Is only taking PPI PRN.  She doesn't have a regular BM. Constipated.      Past Medical History  Diagnosis Date  . Chest pain, unspecified   . HTN (hypertension)   . Shortness of breath   . HLD (hyperlipidemia)   . Anemia, unspecified   . Edema   . Diarrhea of presumed infectious origin   . Urinary tract infection, site not specified   . Other malaise and fatigue   . Other dyspnea and respiratory abnormality   . Abdominal pain, right upper quadrant   . Dysmetabolic syndrome X   . Unspecified sinusitis (chronic)   . Vitamin B12 deficiency   . Hypopotassemia   . Solitary cyst of breast   . Diffuse cystic mastopathy   . Osteoarthritis   . Other diseases of lung, not elsewhere classified   . Esophageal reflux   . Hiatal hernia   . Hypothyroidism   . Breast cyst   . Esophageal stricture   . Gout, unspecified   . Iron deficiency anemia, unspecified   . Type II or unspecified type diabetes mellitus without mention of complication, not stated as uncontrolled     pt. states not a diabetic  . Pneumonia 1993  . Diverticulosis 06-2009    mild- Colonoscopy   . DJD  (degenerative joint disease)   . Lung nodule   . Hypertrophied anal papilla   . History of Helicobacter pylori infection   . Depression   . History of gallstones     Past Surgical History  Procedure Laterality Date  . Shoulder surgery      bilateral  . Bladder suspension    . Replacement total knee      bilateral  . Partial hysterectomy    . Tonsillectomy and adenoidectomy    . Tubal ligation    . Cataract extraction, bilateral  2011  . Cholecystectomy  03/28/2011    Procedure: LAPAROSCOPIC CHOLECYSTECTOMY WITH INTRAOPERATIVE CHOLANGIOGRAM;  Surgeon: Imogene Burn. Georgette Dover, MD;  Location: [facility];  Service: General;  Laterality: N/A;  . Ercp  10/06/2011    Procedure: ENDOSCOPIC RETROGRADE CHOLANGIOPANCREATOGRAPHY (ERCP);  Surgeon: Inda Castle, MD;  Location: [facility];  Service: Endoscopy;  Laterality: N/A;  . Breast cyst excision      left    Current Outpatient Prescriptions  Medication Sig Dispense Refill  . baclofen (LIORESAL) 10 MG tablet Take 1 tablet (10 mg total) by mouth 3 (three) times daily.  30 each  0  . Cholecalciferol (VITAMIN D-3 PO) Take 1,000 Units by mouth daily.       . dorzolamide-timolol (COSOPT) 22.3-6.8 MG/ML ophthalmic solution Place 1 drop into both eyes 2 (two) times daily.       Marland Kitchen  ketoconazole (NIZORAL) 2 % cream Apply 1 application topically 2 (two) times daily.  60 g  5  . levothyroxine (SYNTHROID, LEVOTHROID) 50 MCG tablet TAKE 1 TABLET EVERY DAY  90 tablet  1  . losartan-hydrochlorothiazide (HYZAAR) 50-12.5 MG per tablet TAKE 1 TABLET BY MOUTH EVERY DAY FOR HIGH BLOOD PRESSURE  90 tablet  3  . nortriptyline (PAMELOR) 10 MG capsule Take 1 capsule (10 mg total) by mouth at bedtime.  30 capsule  1  . Omega-3 Fatty Acids (FISH OIL) 1200 MG CAPS Take by mouth.      . pantoprazole (PROTONIX) 40 MG tablet TAKE ONE TABLET BY MOUTH TWICE A DAY  60 tablet  11  . Travoprost, BAK Free, (TRAVATAN) 0.004 % SOLN ophthalmic solution Place 1 drop into both eyes at bedtime.        No current facility-administered medications for this visit.    Allergies as of 09/10/2013 - Review Complete 09/10/2013  Allergen Reaction Noted  . Cephalexin  07/29/2005  . Codeine Nausea And Vomiting   . Gabapentin  07/24/2013  . Sulfamethoxazole-trimethoprim  07/29/2005  . Sulfonamide derivatives    . Vicodin [hydrocodone-acetaminophen] Other (See Comments) 02/14/2011    Family History  Problem Relation Age of Onset  . Stomach cancer Brother   . Breast cancer Maternal Aunt   . Cancer Brother     bladder  . Stroke Mother   . Heart disease Father   . Colon cancer Brother     History   Social History  . Marital Status: Married    Spouse Name: N/A    Number of Children: N/A  . Years of Education: N/A   Occupational History  . Retired    Social History Main Topics  . Smoking status: Former Smoker -- 0.25 packs/day for 5 years    Types: Cigarettes    Quit date: 03/20/1988  . Smokeless tobacco: Current User    Types: Snuff  . Alcohol Use: No  . Drug Use: No  . Sexual Activity: Not Currently   Other Topics Concern  . Not on file   Social History Narrative   Regular exercise-yes      Physical Exam: BP 160/90  Pulse 98 Constitutional: Obese, somewhat frail-appearing Psychiatric: alert and oriented x3 Abdomen: soft, nontender, nondistended, no obvious ascites, no peritoneal signs, normal bowel sounds     Assessment and plan: 80 y.o. female with GERD, intermittent constipation, diarrhea  She has been off proton pump inhibitor for several months now. There was some confusion about her proton next dosing. I have called her in once daily pantoprazole which she will take 20-30 minutes prior to her breakfast meal. Previously this helped her GERD symptoms very well. I also recommended she start fiber supplements for her intermittent constipation, diarrhea. She has not repeat colonoscopy since she has a family history of colon cancer. She is 80 years old I  tried to explain to her that screening, surveillance usually stops between age of 89 and 13. I asked her to call to report on her symptoms in 5-6 weeks after making the changes are described above.

## 2013-09-10 NOTE — Patient Instructions (Signed)
New prescription called in for pantoprazole 40mg  once daily (20-30 min before BM meal). Please start taking citrucel (orange flavored) powder fiber supplement.  This may cause some bloating at first but that usually goes away. Begin with a small spoonful and work your way up to a large, heaping spoonful daily over a week. Call Dr. Ardis Hughs office to report on your symptoms in 5-6 weeks.

## 2013-09-20 ENCOUNTER — Other Ambulatory Visit: Payer: Self-pay | Admitting: Internal Medicine

## 2013-11-27 ENCOUNTER — Other Ambulatory Visit: Payer: Self-pay | Admitting: Internal Medicine

## 2013-12-12 ENCOUNTER — Ambulatory Visit: Payer: Medicare Other | Admitting: Internal Medicine

## 2014-09-22 ENCOUNTER — Other Ambulatory Visit: Payer: Self-pay | Admitting: Gastroenterology

## 2014-09-29 ENCOUNTER — Encounter: Payer: Self-pay | Admitting: Gastroenterology

## 2015-04-16 ENCOUNTER — Other Ambulatory Visit: Payer: Self-pay

## 2015-04-16 MED ORDER — PANTOPRAZOLE SODIUM 40 MG PO TBEC: DELAYED_RELEASE_TABLET | ORAL | Status: DC

## 2015-09-10 ENCOUNTER — Other Ambulatory Visit: Payer: Self-pay | Admitting: Gastroenterology

## 2017-01-27 ENCOUNTER — Telehealth: Payer: Self-pay | Admitting: *Deleted

## 2017-01-27 NOTE — Telephone Encounter (Signed)
NOTES SENT TO SCHEDULING.  °

## 2017-02-08 ENCOUNTER — Encounter (INDEPENDENT_AMBULATORY_CARE_PROVIDER_SITE_OTHER): Payer: Self-pay

## 2017-02-08 ENCOUNTER — Ambulatory Visit: Payer: Medicare Other | Admitting: Physician Assistant

## 2017-02-08 ENCOUNTER — Encounter: Payer: Self-pay | Admitting: Physician Assistant

## 2017-02-08 VITALS — BP 128/82 | HR 60 | Ht 60.0 in | Wt 196.0 lb

## 2017-02-08 DIAGNOSIS — K219 Gastro-esophageal reflux disease without esophagitis: Secondary | ICD-10-CM | POA: Diagnosis not present

## 2017-02-08 DIAGNOSIS — Z9889 Other specified postprocedural states: Secondary | ICD-10-CM | POA: Diagnosis not present

## 2017-02-08 DIAGNOSIS — R1319 Other dysphagia: Secondary | ICD-10-CM | POA: Diagnosis not present

## 2017-02-08 MED ORDER — RANITIDINE HCL 150 MG PO TABS: 150.0000 mg | ORAL_TABLET | Freq: Two times a day (BID) | ORAL | 11 refills | Status: DC

## 2017-02-08 NOTE — Patient Instructions (Addendum)
We have sent the following medications to your pharmacy for you to pick up at your convenience: [place].  1. Zantac 150 mg  You can take over the counter Mucinex ( plain Mucinex) , 1 tablet twice daily to thin the mucus.   You have been scheduled for a Barium Esophogram at [facility] (1st floor of the hospital) on Tuesday 02-14-2017  at 10:30 am. Please arrive 10:15 am minutes prior to your appointment for registration. Make certain not to have anything to eat or drink 4 hours prior to your test. If you need to reschedule for any reason, please contact radiology at [phone] to do so. __________________________________________________________________ A barium swallow is an examination that concentrates on views of the esophagus. This tends to be a double contrast exam (barium and two liquids which, when combined, create a gas to distend the wall of the oesophagus) or single contrast (non-ionic iodine based). The study is usually tailored to your symptoms so a good history is essential. Attention is paid during the study to the form, structure and configuration of the esophagus, looking for functional disorders (such as aspiration, dysphagia, achalasia, motility and reflux) EXAMINATION You may be asked to change into a gown, depending on the type of swallow being performed. A radiologist and radiographer will perform the procedure. The radiologist will advise you of the type of contrast selected for your procedure and direct you during the exam. You will be asked to stand, sit or lie in several different positions and to hold a small amount of fluid in your mouth before being asked to swallow while the imaging is performed .In some instances you may be asked to swallow barium coated marshmallows to assess the motility of a solid food bolus. The exam can be recorded as a digital or video fluoroscopy procedure.  If you are age 81 or older, your body mass index should be  between 23-30. Your Body mass index is 38.28 kg/m. If this is out of the aforementioned range listed, please consider follow up with your Primary Care Provider.          POST PROCEDURE It will take 1-2 days for the barium to pass through your system. To facilitate this, it is important, unless otherwise directed, to increase your fluids for the next 24-48hrs and to resume your normal diet.  This test typically takes about 30 minutes to perform. __________________________________________________________________________________

## 2017-02-08 NOTE — Progress Notes (Signed)
Subjective:    Patient ID: Yesenia [name], female    DOB: 1930/02/07, 83 y.o.   MRN: [id number]  HPI [name] is a very nice 83 year old white female currently established with Dr. Ardis Hughs who was last seen in 2015.  She comes in today with complaints of dysphasia.  She was referred by her PCP Dr. Harrington Challenger. Patient has history of hypertension, chronic GERD, IBS, chronic kidney disease stage IV and hypothyroidism. She has prior history of a huge hiatal hernia which was last documented on EGD in 2011, per Dr. Sharlett Iles.  She was subsequently referred for Nissen fundoplication which was also done in 2011. Last colonoscopy April 2011, no polyps she did have left-sided diverticulosis.  She is status post cholecystectomy in 0350 which was complicated by choledocholithiasis and underwent ERCP per Dr. Deatra Ina with stone removal. She has been on pantoprazole 40 mg p.o. every morning long-term. He says generally this has worked well to control any reflux symptoms, she will intermittently have some breakthrough heartburn in the evenings and will take Rolaids which is helpful. Over the past several months she has been having problems with feeling a fullness like something is in her throat all the time.  She also feels that she has a lot of postnasal drip which is thick and tends to want to hang in her throat.  She has noticed that with eating she will intermittently have a sensation that food is sticking, is unable to get the food to go down her esophagus and generally has to regurgitate it.  She says sometimes a small amount of food and a lot of mucus will come back up. He says when she has these episodes it burns and hurts. She is also been having some left-sided chest pain under her left breast which she has noticed with eating she describes it as an aching pain which usually will resolve within 10-15 minutes.  She is not aware of having this with activity.  She says she does have a cardiology appointment coming up in  a week or so. She is concerned about long-term PPI use and wants to know if she can be taken off of Protonix.    Review of Systems Pertinent positive and negative review of systems were noted in the above HPI section.  All other review of systems was otherwise negative.  Outpatient Encounter Medications as of 02/08/2017  Medication Sig  . dorzolamide-timolol (COSOPT) 22.3-6.8 MG/ML ophthalmic solution Place 1 drop into both eyes 2 (two) times daily.   Marland Kitchen levothyroxine (SYNTHROID, LEVOTHROID) 50 MCG tablet TAKE 1 TABLET EVERY DAY  . losartan-hydrochlorothiazide (HYZAAR) 50-12.5 MG per tablet TAKE 1 TABLET BY MOUTH DAILY  . pantoprazole (PROTONIX) 40 MG tablet TAKE ONE TABLET BY MOUTH ONCE DAILY BEFORE  BREAKFAST  . Travoprost, BAK Free, (TRAVATAN) 0.004 % SOLN ophthalmic solution Place 1 drop into both eyes at bedtime.  . ranitidine (ZANTAC) 150 MG tablet Take 1 tablet (150 mg total) by mouth 2 (two) times daily.  . [DISCONTINUED] baclofen (LIORESAL) 10 MG tablet Take 1 tablet (10 mg total) by mouth 3 (three) times daily.  . [DISCONTINUED] Cholecalciferol (VITAMIN D-3 PO) Take 1,000 Units by mouth daily.   . [DISCONTINUED] ketoconazole (NIZORAL) 2 % cream Apply 1 application topically 2 (two) times daily.  . [DISCONTINUED] nortriptyline (PAMELOR) 10 MG capsule Take 1 capsule (10 mg total) by mouth at bedtime.  . [DISCONTINUED] Omega-3 Fatty Acids (FISH OIL) 1200 MG CAPS Take by mouth.   No facility-administered encounter medications  on file as of 02/08/2017.    Allergies  Allergen Reactions  . Cephalexin     REACTION: questionable  . Codeine Nausea And Vomiting  . Gabapentin     Headache  . Sulfamethoxazole-Trimethoprim     REACTION: unspecified  . Sulfonamide Derivatives   . Vicodin [Hydrocodone-Acetaminophen] Other (See Comments)    MAKES HER CRAZY    Patient Active Problem List   Diagnosis Date Noted  . Lumbar radiculopathy 07/24/2013  . Leukocytosis, unspecified 07/22/2013    . Nocturnal leg cramps 07/18/2013  . Tinea corporis 07/18/2013  . Greater trochanteric bursitis of left hip 06/26/2013  . IBS (irritable bowel syndrome) 03/08/2013  . Kidney disease, chronic, stage IV (GFR 15-29 ml/min) (HCC) 11/19/2012  . GERD (gastroesophageal reflux disease) 07/08/2010  . Obesity 07/08/2010  . GOUT 12/14/2009  . Hyperlipidemia LDL goal < 100 11/21/2008  . VITAMIN B12 DEFICIENCY 09/07/2007  . HYPOTHYROIDISM 07/07/2007  . HYPERTENSION, MILD 07/07/2007  . DEGENERATIVE JOINT DISEASE 07/07/2007   Social History   Socioeconomic History  . Marital status: Married    Spouse name: Not on file  . Number of children: Not on file  . Years of education: Not on file  . Highest education level: Not on file  Social Needs  . Financial resource strain: Not on file  . Food insecurity - worry: Not on file  . Food insecurity - inability: Not on file  . Transportation needs - medical: Not on file  . Transportation needs - non-medical: Not on file  Occupational History  . Occupation: Retired    Fish farm manager: RETIRED  Tobacco Use  . Smoking status: Former Smoker    Packs/day: 0.25    Years: 5.00    Pack years: 1.25    Types: Cigarettes    Last attempt to quit: 03/20/1988    Years since quitting: 28.9  . Smokeless tobacco: Current User    Types: Snuff  Substance and Sexual Activity  . Alcohol use: No  . Drug use: No  . Sexual activity: Not Currently  Other Topics Concern  . Not on file  Social History Narrative   Regular exercise-yes    Ms. Holroyd family history includes Breast cancer in her maternal aunt; Cancer in her brother; Colon cancer in her brother; Heart disease in her father; Stomach cancer in her brother; Stroke in her mother.      Objective:    Vitals:   02/08/17 1051  BP: 128/82  Pulse: 60    Physical Exam Well-developed elderly white female in no acute distress, accompanied by her husband both very pleasant, blood pressure 128/82, pulse 60,  height 5 foot, weight 196, BMI of 38.2.  HEENT; nontraumatic normocephalic EOMI PERRLA sclera anicteric, Cardiovascular; regular rate and rhythm with S1-S2 no murmur rub or gallop, Pulmonary ;clear bilaterally, Abdomen ;obese, soft, bowel sounds are present no palpable mass or hepatosplenomegaly she is nontender, Rectal ;exam not done, Extremities; no clubbing cyanosis or edema skin warm and dry, she ambulates very slowly, Neuro ;psych mood and affect appropriate       Assessment & Plan:   #24 83 year old female with long history of chronic GERD and very large hiatal hernia who is status post Nissen fundoplication in 4098.  She has remained on PPI therapy. New complaint of intermittent dysphagia requiring regurgitation, and a sensation of fullness in her throat and increased mucus. Will rule out esophageal stricture, rule out dysmotility, I definitely think there is a component of postnasal drainage contributing to symptoms. #2  status post cholecystectomy 2013 and ERCP with stone extraction #3.  Diverticulosis #4.  Left-sided chest pain with meals, etiology not clear this may be secondary to GERD, or gastropathy, atypical for cardiac as occurring at rest while eating. #5.  Hypertension #6.  Chronic kidney disease   #7 IBS  Plan; Patient would like to come off PPI therapy so will stop Protonix and start Zantac 150 mg p.o. twice daily, prescription sent.  If this is effective she may stay on this long-term, if ineffective she will go back to pantoprazole 40 mg p.o. every morning #2.  We will schedule for barium swallow with tablet #3.  Start Mucinex 1 p.o. twice daily to thin secretions. Further plans pending response to above and findings of barium swallow. Amy S Esterwood PA-C 02/08/2017   Cc: No ref. provider found

## 2017-02-08 NOTE — Progress Notes (Signed)
I agree with the above note, plan 

## 2017-02-14 ENCOUNTER — Ambulatory Visit (HOSPITAL_COMMUNITY)
Admission: RE | Admit: 2017-02-14 | Discharge: 2017-02-14 | Disposition: A | Discharge status: Home / Self Care | Payer: Medicare Other | Source: Ambulatory Visit | Attending: Physician Assistant | Admitting: Physician Assistant

## 2017-02-14 DIAGNOSIS — K449 Diaphragmatic hernia without obstruction or gangrene: Secondary | ICD-10-CM | POA: Diagnosis not present

## 2017-02-14 DIAGNOSIS — Z9889 Other specified postprocedural states: Secondary | ICD-10-CM

## 2017-02-14 DIAGNOSIS — K219 Gastro-esophageal reflux disease without esophagitis: Secondary | ICD-10-CM | POA: Insufficient documentation

## 2017-02-14 DIAGNOSIS — R1319 Other dysphagia: Secondary | ICD-10-CM | POA: Diagnosis present

## 2017-02-28 NOTE — Progress Notes (Signed)
Referring-Charles Melinda Crutch MD Reason for referral-dyspnea  HPI: 83 yo female for evaluation of dyspnea at request of Lawerance Cruel MD. Echo 3/10 showed normal LV function; mild LVH; grade 1 DD; mild LAE. Laboratories 11/18 show BUN 28 and creatinine 1.99. TSH 1.94. Patient complains of dyspnea on exertion. No orthopnea, PND or pedal edema. She has occasional pain over her left breast and under her left breast.It typically occurs after eating. No radiation. Lasts 3-4 minutes and resolves spontaneously.No associated nausea, dyspnea or diaphoresis. She does not clearly have exertional chest pain. Because of the above we were asked to evaluate.  Current Outpatient Medications  Medication Sig Dispense Refill  . Cholecalciferol (VITAMIN D3 PO) Take 1 tablet by mouth daily.    . Cyanocobalamin (VITAMIN B-12 PO) Take 1 tablet by mouth daily.    . dorzolamide-timolol (COSOPT) 22.3-6.8 MG/ML ophthalmic solution Place 1 drop into both eyes 2 (two) times daily.     Marland Kitchen levothyroxine (SYNTHROID, LEVOTHROID) 50 MCG tablet TAKE 1 TABLET EVERY DAY 90 tablet 1  . losartan-hydrochlorothiazide (HYZAAR) 50-12.5 MG per tablet TAKE 1 TABLET BY MOUTH DAILY 90 tablet 3  . pantoprazole (PROTONIX) 40 MG tablet TAKE ONE TABLET BY MOUTH ONCE DAILY BEFORE  BREAKFAST 60 tablet 0  . ranitidine (ZANTAC) 150 MG tablet Take 1 tablet (150 mg total) by mouth 2 (two) times daily. 60 tablet 11  . Travoprost, BAK Free, (TRAVATAN) 0.004 % SOLN ophthalmic solution Place 1 drop into both eyes at bedtime.     No current facility-administered medications for this visit.     Allergies  Allergen Reactions  . Cephalexin     REACTION: questionable  . Codeine Nausea And Vomiting  . Gabapentin     Headache  . Sulfamethoxazole-Trimethoprim     REACTION: unspecified  . Sulfonamide Derivatives   . Vicodin [Hydrocodone-Acetaminophen] Other (See Comments)    MAKES HER CRAZY      Past Medical History:  Diagnosis Date  .  Anemia, unspecified   . Breast cyst   . Chest pain, unspecified   . Depression   . Diarrhea of presumed infectious origin   . Diffuse cystic mastopathy   . Diverticulosis 06-2009   mild- Colonoscopy   . DJD (degenerative joint disease)   . Dysmetabolic syndrome X   . Edema   . Esophageal reflux   . Esophageal stricture   . Gout, unspecified   . Hiatal hernia   . History of gallstones   . History of Helicobacter pylori infection   . HLD (hyperlipidemia)   . HTN (hypertension)   . Hypertrophied anal papilla   . Hypopotassemia   . Hypothyroidism   . Iron deficiency anemia, unspecified   . Lung nodule   . Osteoarthritis   . Pneumonia 1993  . Solitary cyst of breast   . Type II or unspecified type diabetes mellitus without mention of complication, not stated as uncontrolled    pt. states not a diabetic  . Unspecified sinusitis (chronic)   . Vitamin B12 deficiency     Past Surgical History:  Procedure Laterality Date  . BLADDER SUSPENSION    . BREAST CYST EXCISION     left  . CATARACT EXTRACTION, BILATERAL  2011  . CHOLECYSTECTOMY  03/28/2011   Procedure: LAPAROSCOPIC CHOLECYSTECTOMY WITH INTRAOPERATIVE CHOLANGIOGRAM;  Surgeon: Imogene Burn. Georgette Dover, MD;  Location: [facility];  Service: General;  Laterality: N/A;  . ERCP  10/06/2011   Procedure: ENDOSCOPIC RETROGRADE CHOLANGIOPANCREATOGRAPHY (ERCP);  Surgeon: Sandy Salaam  Deatra Ina, MD;  Location: [facility];  Service: Endoscopy;  Laterality: N/A;  . PARTIAL HYSTERECTOMY    . REPLACEMENT TOTAL KNEE     bilateral  . SHOULDER SURGERY     bilateral  . TONSILLECTOMY AND ADENOIDECTOMY    . TUBAL LIGATION      Social History   Socioeconomic History  . Marital status: Married    Spouse name: Not on file  . Number of children: 5  . Years of education: Not on file  . Highest education level: Not on file  Social Needs  . Financial resource strain: Not on file  . Food insecurity - worry: Not on file  . Food insecurity - inability: Not on file    . Transportation needs - medical: Not on file  . Transportation needs - non-medical: Not on file  Occupational History  . Occupation: Retired    Fish farm manager: RETIRED  Tobacco Use  . Smoking status: Former Smoker    Packs/day: 0.25    Years: 5.00    Pack years: 1.25    Types: Cigarettes    Last attempt to quit: 03/20/1988    Years since quitting: 28.9  . Smokeless tobacco: Current User    Types: Snuff  Substance and Sexual Activity  . Alcohol use: No  . Drug use: No  . Sexual activity: Not Currently  Other Topics Concern  . Not on file  Social History Narrative   Regular exercise-yes    Family History  Problem Relation Age of Onset  . Stroke Mother   . Heart disease Father   . Stomach cancer Brother   . Breast cancer Maternal Aunt   . Cancer Brother        bladder  . Colon cancer Brother     ROS:  Arthralgias but no fevers or chills, productive cough, hemoptysis, dysphasia, odynophagia, melena, hematochezia, dysuria, hematuria, rash, seizure activity, orthopnea, PND, pedal edema, claudication. Remaining systems are negative.  Physical Exam:   Blood pressure 138/78, pulse 90, height 5' (1.524 m), weight 197 lb (89.4 kg).  General:  Well developed/obese in NAD Skin warm/dry Patient not depressed No peripheral clubbing Back-normal HEENT-normal/normal eyelids Neck supple/normal carotid upstroke bilaterally; no bruits; no JVD; no thyromegaly chest - CTA/ normal expansion CV - RRR/normal S1 and S2; no murmurs, rubs or gallops;  PMI nondisplaced Abdomen -NT/ND, no HSM, no mass, + bowel sounds, no bruit 2+ femoral pulses, no bruits Ext-no edema, chords, 2+ DP Neuro-grossly nonfocal  ECG - sinus rhythm at a rate of 90. No ST changes.personally reviewed  A/P  1 dyspnea-Pt complains of DOE and chest pain; will arrange lexiscan nuclear study to risk stratify and echo to assess LV systolic and diastolic function. Note she has baseline renal insufficiency and would be at  risk for contrast nephropathy if cardiac catheterization pursued.  2 hypertension-blood pressure is controlled. Continue present medications.  3 chest pain-plan nuclear study for risk stratification.  4 chronic stage III kidney disease-as outlined above would be at risk for contrast nephropathy with cardiac catheterization.  Kirk Ruths, MD

## 2017-03-09 ENCOUNTER — Ambulatory Visit: Payer: Medicare Other | Admitting: Cardiology

## 2017-03-09 ENCOUNTER — Encounter: Payer: Self-pay | Admitting: Cardiology

## 2017-03-09 VITALS — BP 138/78 | HR 90 | Ht 60.0 in | Wt 197.0 lb

## 2017-03-09 DIAGNOSIS — R072 Precordial pain: Secondary | ICD-10-CM

## 2017-03-09 DIAGNOSIS — I10 Essential (primary) hypertension: Secondary | ICD-10-CM | POA: Diagnosis not present

## 2017-03-09 DIAGNOSIS — R0609 Other forms of dyspnea: Secondary | ICD-10-CM

## 2017-03-09 NOTE — Patient Instructions (Signed)
Medication Instructions:   NO CHANGE  Testing/Procedures:  Your physician has requested that you have an echocardiogram. Echocardiography is a painless test that uses sound waves to create images of your heart. It provides your doctor with information about the size and shape of your heart and how well your heart's chambers and valves are working. This procedure takes approximately one hour. There are no restrictions for this procedure.   Your physician has requested that you have a lexiscan myoview. For further information please visit [email or web address]. Please follow instruction sheet, as given.    Follow-Up:  Your physician recommends that you schedule a follow-up appointment in: AS NEEDED

## 2017-03-16 ENCOUNTER — Telehealth (HOSPITAL_COMMUNITY): Payer: Self-pay

## 2017-03-16 NOTE — Telephone Encounter (Signed)
Encounter complete. 

## 2017-03-17 ENCOUNTER — Other Ambulatory Visit (HOSPITAL_COMMUNITY): Payer: Medicare Other

## 2017-03-21 ENCOUNTER — Inpatient Hospital Stay (HOSPITAL_COMMUNITY)
Admission: RE | Admit: 2017-03-21 | Payer: Medicare Other | Source: Ambulatory Visit | Attending: Cardiology | Admitting: Cardiology

## 2017-03-22 ENCOUNTER — Inpatient Hospital Stay (HOSPITAL_COMMUNITY): Admission: RE | Admit: 2017-03-22 | Payer: Medicare Other | Source: Ambulatory Visit

## 2017-03-27 ENCOUNTER — Other Ambulatory Visit (HOSPITAL_COMMUNITY): Payer: Medicare Other

## 2017-04-03 ENCOUNTER — Ambulatory Visit (HOSPITAL_COMMUNITY): Payer: Medicare Other | Attending: Cardiology

## 2017-04-03 ENCOUNTER — Other Ambulatory Visit: Payer: Self-pay

## 2017-04-03 DIAGNOSIS — E669 Obesity, unspecified: Secondary | ICD-10-CM | POA: Diagnosis not present

## 2017-04-03 DIAGNOSIS — R072 Precordial pain: Secondary | ICD-10-CM | POA: Insufficient documentation

## 2017-04-03 DIAGNOSIS — Z6838 Body mass index (BMI) 38.0-38.9, adult: Secondary | ICD-10-CM | POA: Insufficient documentation

## 2017-04-03 DIAGNOSIS — R0609 Other forms of dyspnea: Secondary | ICD-10-CM | POA: Diagnosis not present

## 2017-04-03 DIAGNOSIS — E119 Type 2 diabetes mellitus without complications: Secondary | ICD-10-CM | POA: Diagnosis not present

## 2017-04-03 DIAGNOSIS — E785 Hyperlipidemia, unspecified: Secondary | ICD-10-CM | POA: Diagnosis not present

## 2017-04-05 ENCOUNTER — Ambulatory Visit (HOSPITAL_COMMUNITY)
Admission: RE | Admit: 2017-04-05 | Discharge: 2017-04-05 | Disposition: A | Discharge status: Home / Self Care | Payer: Medicare Other | Source: Ambulatory Visit | Attending: Cardiology | Admitting: Cardiology

## 2017-04-05 DIAGNOSIS — R072 Precordial pain: Secondary | ICD-10-CM | POA: Insufficient documentation

## 2017-04-05 DIAGNOSIS — R0609 Other forms of dyspnea: Secondary | ICD-10-CM | POA: Insufficient documentation

## 2017-04-05 LAB — MYOCARDIAL PERFUSION IMAGING
CHL CUP RESTING HR STRESS: 67 {beats}/min
LVDIAVOL: 55 mL (ref 46–106)
LVSYSVOL: 20 mL
NUC STRESS TID: 1.2
Peak HR: 77 {beats}/min
SDS: 2
SRS: 2
SSS: 2

## 2017-04-05 MED ORDER — REGADENOSON 0.4 MG/5ML IV SOLN
0.4000 mg | Freq: Once | INTRAVENOUS | Status: AC
  Administered 2017-04-05: 0.4 mg via INTRAVENOUS

## 2017-04-05 MED ORDER — TECHNETIUM TC 99M TETROFOSMIN IV KIT
10.6000 | PACK | Freq: Once | INTRAVENOUS | Status: AC | PRN
  Administered 2017-04-05: 10.6 via INTRAVENOUS
  Filled 2017-04-05: qty 11

## 2017-04-05 MED ORDER — TECHNETIUM TC 99M TETROFOSMIN IV KIT
30.3000 | PACK | Freq: Once | INTRAVENOUS | Status: AC | PRN
  Administered 2017-04-05: 30.3 via INTRAVENOUS
  Filled 2017-04-05: qty 31

## 2017-05-26 ENCOUNTER — Encounter: Payer: Self-pay | Admitting: Cardiology

## 2017-05-29 NOTE — Progress Notes (Signed)
HPI: Follow-up chest pain.  Echocardiogram January 2019 showed normal LV function, mitral valve area of 1.1 cm suggestive of severe mitral stenosis (however mean gradient 5 mmHg), mild left atrial enlargement.  Nuclear study January 2019 showed ejection fraction 64% and normal perfusion.  Since last seen she notes some dyspnea on exertion which has been present several years.  No orthopnea or PND.  Occasional minimal pedal edema.  She does have some shoulder pain with certain arm movements but no chest pain.  No syncope.  Current Outpatient Medications  Medication Sig Dispense Refill  . dorzolamide-timolol (COSOPT) 22.3-6.8 MG/ML ophthalmic solution Place 1 drop into both eyes 2 (two) times daily.     Marland Kitchen levothyroxine (SYNTHROID, LEVOTHROID) 50 MCG tablet TAKE 1 TABLET EVERY DAY 90 tablet 1  . losartan-hydrochlorothiazide (HYZAAR) 50-12.5 MG per tablet TAKE 1 TABLET BY MOUTH DAILY 90 tablet 3  . pantoprazole (PROTONIX) 40 MG tablet TAKE ONE TABLET BY MOUTH ONCE DAILY BEFORE  BREAKFAST 60 tablet 0  . Travoprost, BAK Free, (TRAVATAN) 0.004 % SOLN ophthalmic solution Place 1 drop into both eyes at bedtime.     No current facility-administered medications for this visit.      Past Medical History:  Diagnosis Date  . Anemia, unspecified   . Breast cyst   . Chest pain, unspecified   . Depression   . Diarrhea of presumed infectious origin   . Diffuse cystic mastopathy   . Diverticulosis 06-2009   mild- Colonoscopy   . DJD (degenerative joint disease)   . Dysmetabolic syndrome X   . Edema   . Esophageal reflux   . Esophageal stricture   . Gout, unspecified   . Hiatal hernia   . History of gallstones   . History of Helicobacter pylori infection   . HLD (hyperlipidemia)   . HTN (hypertension)   . Hypertrophied anal papilla   . Hypopotassemia   . Hypothyroidism   . Iron deficiency anemia, unspecified   . Lung nodule   . Osteoarthritis   . Pneumonia 1993  . Solitary cyst of  breast   . Type II or unspecified type diabetes mellitus without mention of complication, not stated as uncontrolled    pt. states not a diabetic  . Unspecified sinusitis (chronic)   . Vitamin B12 deficiency     Past Surgical History:  Procedure Laterality Date  . BLADDER SUSPENSION    . BREAST CYST EXCISION     left  . CATARACT EXTRACTION, BILATERAL  2011  . CHOLECYSTECTOMY  03/28/2011   Procedure: LAPAROSCOPIC CHOLECYSTECTOMY WITH INTRAOPERATIVE CHOLANGIOGRAM;  Surgeon: Imogene Burn. Georgette Dover, MD;  Location: [facility];  Service: General;  Laterality: N/A;  . ERCP  10/06/2011   Procedure: ENDOSCOPIC RETROGRADE CHOLANGIOPANCREATOGRAPHY (ERCP);  Surgeon: Inda Castle, MD;  Location: [facility];  Service: Endoscopy;  Laterality: N/A;  . PARTIAL HYSTERECTOMY    . REPLACEMENT TOTAL KNEE     bilateral  . SHOULDER SURGERY     bilateral  . TONSILLECTOMY AND ADENOIDECTOMY    . TUBAL LIGATION      Social History   Socioeconomic History  . Marital status: Married    Spouse name: Not on file  . Number of children: 5  . Years of education: Not on file  . Highest education level: Not on file  Occupational History  . Occupation: Retired    Fish farm manager: RETIRED  Social Needs  . Financial resource strain: Not on file  . Food insecurity:  Worry: Not on file    Inability: Not on file  . Transportation needs:    Medical: Not on file    Non-medical: Not on file  Tobacco Use  . Smoking status: Former Smoker    Packs/day: 0.25    Years: 5.00    Pack years: 1.25    Types: Cigarettes    Last attempt to quit: 03/20/1988    Years since quitting: 29.2  . Smokeless tobacco: Current User    Types: Snuff  Substance and Sexual Activity  . Alcohol use: No  . Drug use: No  . Sexual activity: Not Currently  Lifestyle  . Physical activity:    Days per week: Not on file    Minutes per session: Not on file  . Stress: Not on file  Relationships  . Social connections:    Talks on phone: Not on file     Gets together: Not on file    Attends religious service: Not on file    Active member of club or organization: Not on file    Attends meetings of clubs or organizations: Not on file    Relationship status: Not on file  . Intimate partner violence:    Fear of current or ex partner: Not on file    Emotionally abused: Not on file    Physically abused: Not on file    Forced sexual activity: Not on file  Other Topics Concern  . Not on file  Social History Narrative   Regular exercise-yes    Family History  Problem Relation Age of Onset  . Stroke Mother   . Heart disease Father   . Stomach cancer Brother   . Breast cancer Maternal Aunt   . Cancer Brother        bladder  . Colon cancer Brother     ROS: Arthralgias but no fevers or chills, productive cough, hemoptysis, dysphasia, odynophagia, melena, hematochezia, dysuria, hematuria, rash, seizure activity, orthopnea, PND, pedal edema, claudication. Remaining systems are negative.  Physical Exam: Well-developed obese in no acute distress.  Skin is warm and dry.  HEENT is normal.  Neck is supple.  Chest is clear to auscultation with normal expansion.  Cardiovascular exam is regular rate and rhythm.  Abdominal exam nontender or distended. No masses palpated. Extremities show trace edema. neuro grossly intact   A/P  1 chest pain-there is no evidence of ischemia on nuclear study.  No plans for further ischemia evaluation.  2 mitral stenosis-I have personally reviewed the patient's echocardiogram.  She has severe mitral annular calcification.  Severe mitral stenosis by pressure half-time but mean gradient 5 mmHg suggests moderate stenosis.  She would not be a candidate for balloon valvuloplasty given subvalvular calcification and would require mitral valve replacement (which given her age and overall medical condition would not be optimal).  I would like to be conservative at this point.  We will plan follow-up echocardiograms in the  future. Pt and husband in agreement.  3 hypertension-blood pressure is elevated.  However she has not taken meds this AM. Continue present medications and follow.  4 chronic stage III kidney disease-last creatinine November 2018 1.99.  She would be at risk for contrast nephropathy if she required cardiac catheterization in the future.  Kirk Ruths, MD

## 2017-06-05 ENCOUNTER — Ambulatory Visit: Payer: Medicare Other | Admitting: Cardiology

## 2017-06-05 ENCOUNTER — Encounter: Payer: Self-pay | Admitting: Cardiology

## 2017-06-05 VITALS — BP 172/88 | HR 83 | Ht 60.0 in | Wt 203.0 lb

## 2017-06-05 DIAGNOSIS — I342 Nonrheumatic mitral (valve) stenosis: Secondary | ICD-10-CM

## 2017-06-05 DIAGNOSIS — I10 Essential (primary) hypertension: Secondary | ICD-10-CM

## 2017-06-05 DIAGNOSIS — R072 Precordial pain: Secondary | ICD-10-CM | POA: Diagnosis not present

## 2017-06-05 NOTE — Patient Instructions (Signed)
Your physician recommends that you schedule a follow-up appointment in: [place]  If you need a refill on your cardiac medications before your next appointment, please call your pharmacy.

## 2017-11-30 NOTE — Progress Notes (Signed)
HPI: Follow-up mitral stenosis.  Echocardiogram January 2019 showed normal LV function, mitral valve area of 1.1 cm suggestive of severe mitral stenosis (however mean gradient 5 mmHg), mild left atrial enlargement.  Nuclear study January 2019 showed ejection fraction 64% and normal perfusion.  At last office visit we discussed options concerning mitral stenosis.  Not a candidate for balloon valvuloplasty given subvalvular calcification.  She would therefore require mitral valve replacement which I felt would be not ideal given age and underlying medical problems.  We therefore elected conservative management at this point.  Since last seen she continues to have dyspnea on exertion but no orthopnea, PND, pedal edema, exertional chest pain or syncope.  She does occasionally have chest pain after eating.  Current Outpatient Medications  Medication Sig Dispense Refill  . amLODipine (NORVASC) 5 MG tablet Take 5 mg by mouth daily.    . dorzolamide-timolol (COSOPT) 22.3-6.8 MG/ML ophthalmic solution Place 1 drop into both eyes 2 (two) times daily.     . furosemide (LASIX) 40 MG tablet Take 40 mg by mouth daily.    Marland Kitchen levothyroxine (SYNTHROID, LEVOTHROID) 50 MCG tablet TAKE 1 TABLET EVERY DAY 90 tablet 1  . pantoprazole (PROTONIX) 40 MG tablet TAKE ONE TABLET BY MOUTH ONCE DAILY BEFORE  BREAKFAST 60 tablet 0  . Travoprost, BAK Free, (TRAVATAN) 0.004 % SOLN ophthalmic solution Place 1 drop into both eyes at bedtime.     No current facility-administered medications for this visit.      Past Medical History:  Diagnosis Date  . Anemia, unspecified   . Breast cyst   . Chest pain, unspecified   . Depression   . Diarrhea of presumed infectious origin   . Diffuse cystic mastopathy   . Diverticulosis 06-2009   mild- Colonoscopy   . DJD (degenerative joint disease)   . Dysmetabolic syndrome X   . Edema   . Esophageal reflux   . Esophageal stricture   . Gout, unspecified   . Hiatal hernia   .  History of gallstones   . History of Helicobacter pylori infection   . HLD (hyperlipidemia)   . HTN (hypertension)   . Hypertrophied anal papilla   . Hypopotassemia   . Hypothyroidism   . Iron deficiency anemia, unspecified   . Lung nodule   . Osteoarthritis   . Pneumonia 1993  . Solitary cyst of breast   . Type II or unspecified type diabetes mellitus without mention of complication, not stated as uncontrolled    pt. states not a diabetic  . Unspecified sinusitis (chronic)   . Vitamin B12 deficiency     Past Surgical History:  Procedure Laterality Date  . BLADDER SUSPENSION    . BREAST CYST EXCISION     left  . CATARACT EXTRACTION, BILATERAL  2011  . CHOLECYSTECTOMY  03/28/2011   Procedure: LAPAROSCOPIC CHOLECYSTECTOMY WITH INTRAOPERATIVE CHOLANGIOGRAM;  Surgeon: Imogene Burn. Georgette Dover, MD;  Location: [facility];  Service: General;  Laterality: N/A;  . ERCP  10/06/2011   Procedure: ENDOSCOPIC RETROGRADE CHOLANGIOPANCREATOGRAPHY (ERCP);  Surgeon: Inda Castle, MD;  Location: [place];  Service: Endoscopy;  Laterality: N/A;  . PARTIAL HYSTERECTOMY    . REPLACEMENT TOTAL KNEE     bilateral  . SHOULDER SURGERY     bilateral  . TONSILLECTOMY AND ADENOIDECTOMY    . TUBAL LIGATION      Social History   Socioeconomic History  . Marital status: Married    Spouse name: Not on file  .  Number of children: 5  . Years of education: Not on file  . Highest education level: Not on file  Occupational History  . Occupation: Retired    Fish farm manager: RETIRED  Social Needs  . Financial resource strain: Not on file  . Food insecurity:    Worry: Not on file    Inability: Not on file  . Transportation needs:    Medical: Not on file    Non-medical: Not on file  Tobacco Use  . Smoking status: Former Smoker    Packs/day: 0.25    Years: 5.00    Pack years: 1.25    Types: Cigarettes    Last attempt to quit: 03/20/1988    Years since quitting: 29.7  . Smokeless tobacco: Current User    Types: Snuff    Substance and Sexual Activity  . Alcohol use: No  . Drug use: No  . Sexual activity: Not Currently  Lifestyle  . Physical activity:    Days per week: Not on file    Minutes per session: Not on file  . Stress: Not on file  Relationships  . Social connections:    Talks on phone: Not on file    Gets together: Not on file    Attends religious service: Not on file    Active member of club or organization: Not on file    Attends meetings of clubs or organizations: Not on file    Relationship status: Not on file  . Intimate partner violence:    Fear of current or ex partner: Not on file    Emotionally abused: Not on file    Physically abused: Not on file    Forced sexual activity: Not on file  Other Topics Concern  . Not on file  Social History Narrative   Regular exercise-yes    Family History  Problem Relation Age of Onset  . Stroke Mother   . Heart disease Father   . Stomach cancer Brother   . Breast cancer Maternal Aunt   . Cancer Brother        bladder  . Colon cancer Brother     ROS: Arthralgias and occasional abdominal pain but no fevers or chills, productive cough, hemoptysis, dysphasia, odynophagia, melena, hematochezia, dysuria, hematuria, rash, seizure activity, orthopnea, PND, pedal edema, claudication. Remaining systems are negative.  Physical Exam: Well-developed obese in no acute distress.  Skin is warm and dry.  HEENT is normal.  Neck is supple.  Chest is clear to auscultation with normal expansion.  Cardiovascular exam is regular rate and rhythm.  Abdominal exam nontender or distended. No masses palpated. Extremities show no edema. neuro grossly intact  ECG-sinus rhythm with occasional PVC.  No significant ST changes.  Personally reviewed  A/P  1 mitral stenosis-as outlined in previous notes patient has significant mitral annular calcification.  There is severe mitral stenosis by pressure half-time but moderate based on mean gradient.  She would not  be a candidate for mitral valve balloon valvuloplasty given subvalvular calcification.  Given age she would be higher risk for mitral valve replacement.  We would like to be conservative.  We will plan to repeat echocardiogram January 2020.  2 hypertension-patient's blood pressure is borderline; continue present meds and follow.  3 chest pain-no exertional symptoms.  Previous nuclear study negative.  Kirk Ruths, MD

## 2017-12-07 ENCOUNTER — Ambulatory Visit: Payer: Medicare Other | Admitting: Cardiology

## 2017-12-07 ENCOUNTER — Encounter: Payer: Self-pay | Admitting: Cardiology

## 2017-12-07 VITALS — BP 134/92 | HR 82 | Ht 60.0 in | Wt 196.0 lb

## 2017-12-07 DIAGNOSIS — R072 Precordial pain: Secondary | ICD-10-CM | POA: Diagnosis not present

## 2017-12-07 DIAGNOSIS — I342 Nonrheumatic mitral (valve) stenosis: Secondary | ICD-10-CM | POA: Diagnosis not present

## 2017-12-07 DIAGNOSIS — I10 Essential (primary) hypertension: Secondary | ICD-10-CM

## 2017-12-07 NOTE — Patient Instructions (Signed)
Medication Instructions:   NO CHANGE  Testing/Procedures:  Your physician has requested that you have an echocardiogram. Echocardiography is a painless test that uses sound waves to create images of your heart. It provides your doctor with information about the size and shape of your heart and how well your heart's chambers and valves are working. This procedure takes approximately one hour. There are no restrictions for this procedure.SCHEDULE IN January     Follow-Up:  Your physician wants you to follow-up in: [place] will receive a reminder letter in the mail two months in advance. If you don't receive a letter, please call our office to schedule the follow-up appointment.   If you need a refill on your cardiac medications before your next appointment, please call your pharmacy.

## 2018-03-26 ENCOUNTER — Ambulatory Visit (HOSPITAL_COMMUNITY): Payer: Medicare Other | Attending: Cardiovascular Disease

## 2018-03-26 ENCOUNTER — Other Ambulatory Visit: Payer: Self-pay

## 2018-03-26 DIAGNOSIS — I342 Nonrheumatic mitral (valve) stenosis: Secondary | ICD-10-CM | POA: Diagnosis present

## 2018-07-13 ENCOUNTER — Telehealth: Payer: Self-pay | Admitting: Cardiology

## 2018-07-13 NOTE — Telephone Encounter (Signed)
Left message for patient to call and schedule 6 mos recall telehealth visit with Dr. Stanford Breed

## 2018-10-26 ENCOUNTER — Ambulatory Visit: Payer: Medicare Other | Admitting: Cardiology

## 2019-01-17 IMAGING — RF DG ESOPHAGUS
7 of 9 series · 13 of 24 positions shown · non-contrast
Comparison: 09/05/2009

CLINICAL DATA: 87-year-old female post fundoplication 2388. Now
with dysphagia. Initial encounter.

EXAM:
ESOPHOGRAM/BARIUM SWALLOW
TECHNIQUE: Single contrast examination was performed using  thin barium.
FLUOROSCOPY TIME:  Fluoroscopy Time:  54 seconds
Radiation Exposure Index 9.8 mGy

[Series 1: cp_standard · 0.35mm/px · 2 of 6 frames shown (1 of 7)]
[frame 1/6]
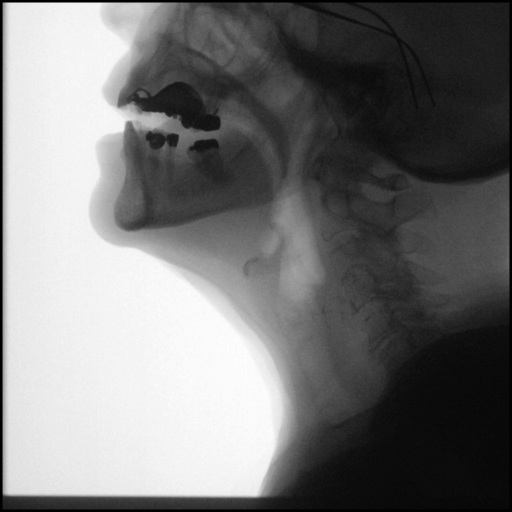
[frame 4/6]
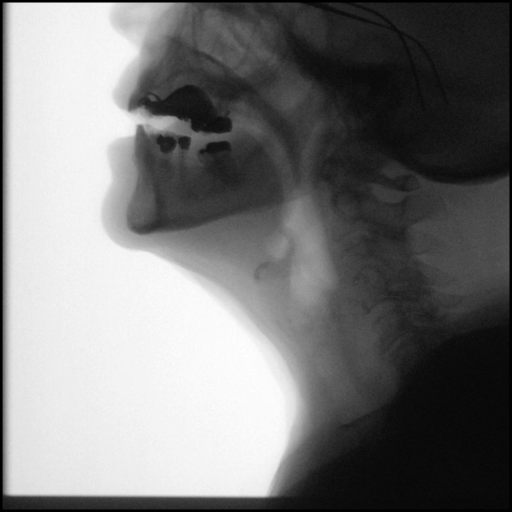

[Series 2: cp_standard · 0.35mm/px · 2 of 107 frames shown (2 of 7)]
[frame 6/107]
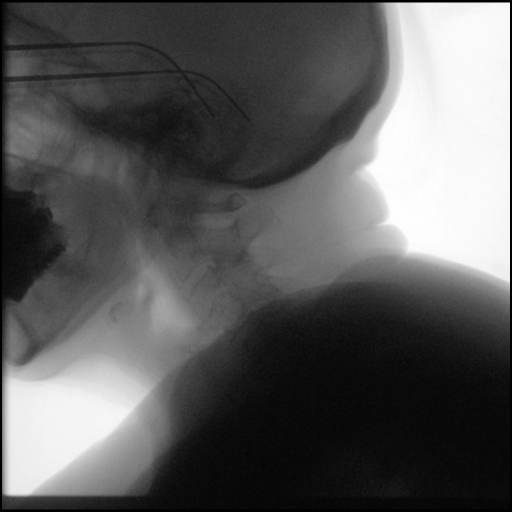
[frame 54/107]
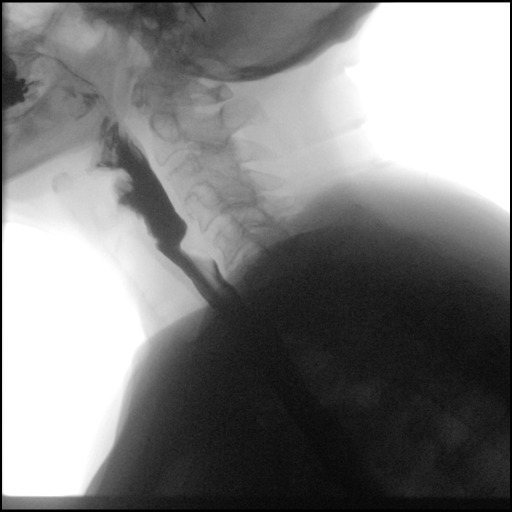

[Series 3: cp_standard · 0.35mm/px · 2 of 55 frames shown (3 of 7)]
[frame 9/55]
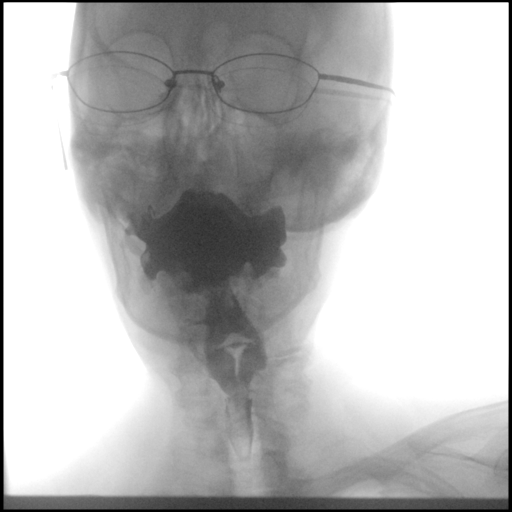
[frame 37/55]
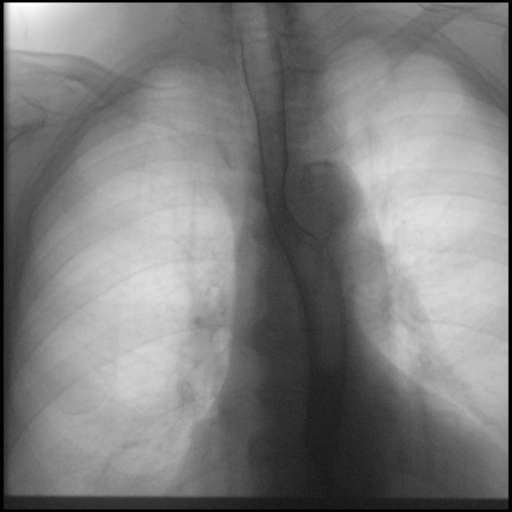

[Series 4: cp_standard · 0.35mm/px · 3 of 74 frames shown (4 of 7)]
[frame 12/74]
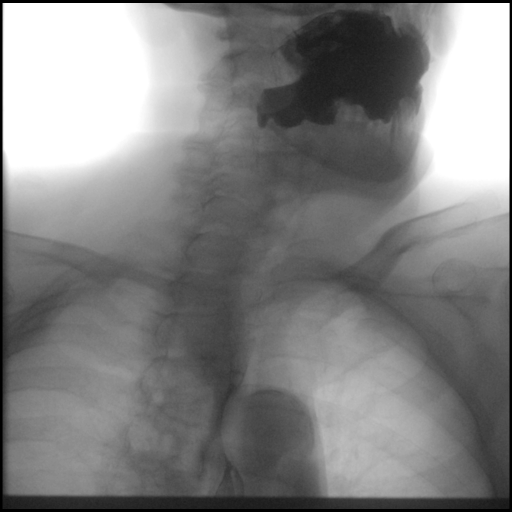
[frame 38/74]
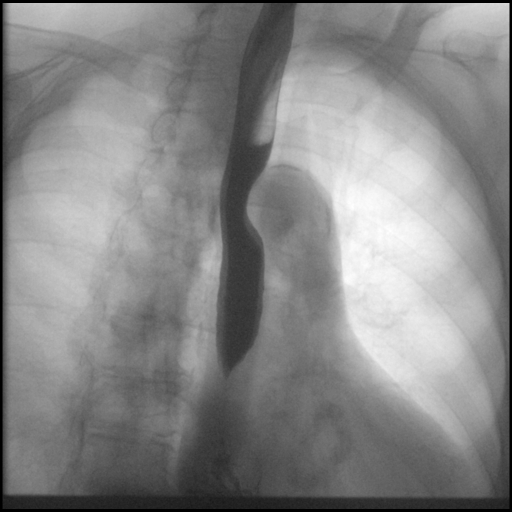
[frame 66/74]
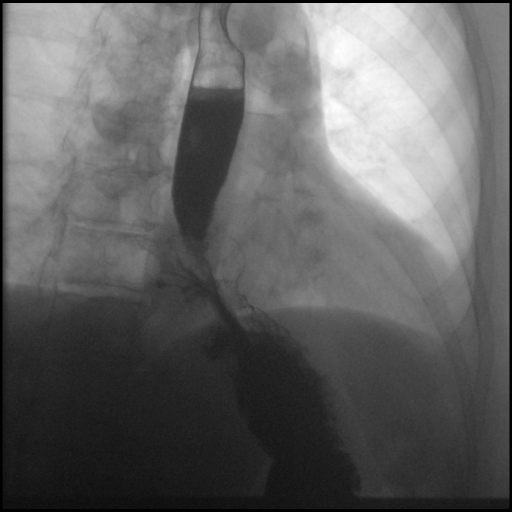

[Series 6: cp_standard · 0.17mm/px · 1 of 1 slices shown (5 of 7)]
[im 1/1]
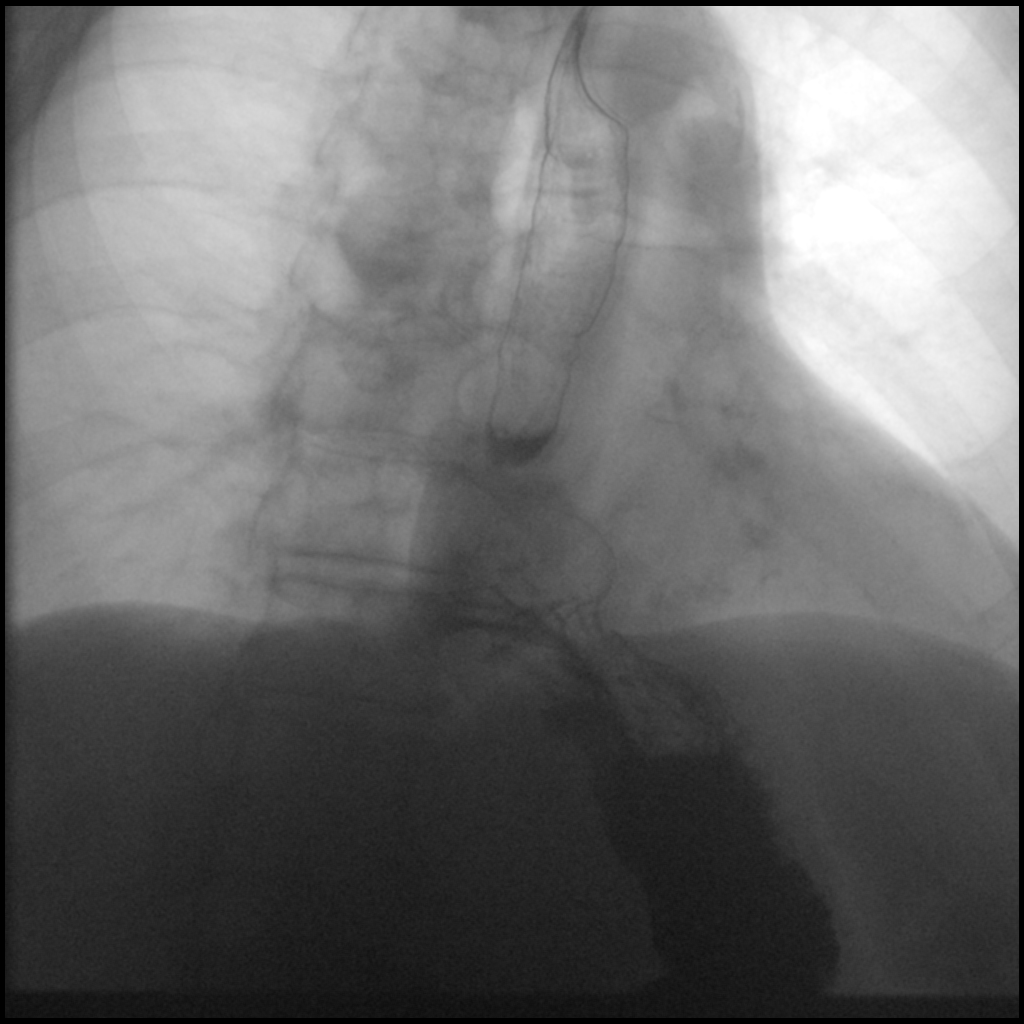

[Series 8: cp_standard · 0.35mm/px · 2 of 26 frames shown (6 of 7)]
[frame 4/26]
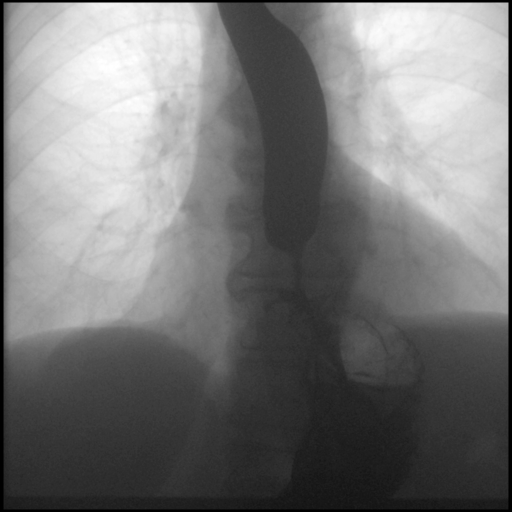
[frame 23/26]
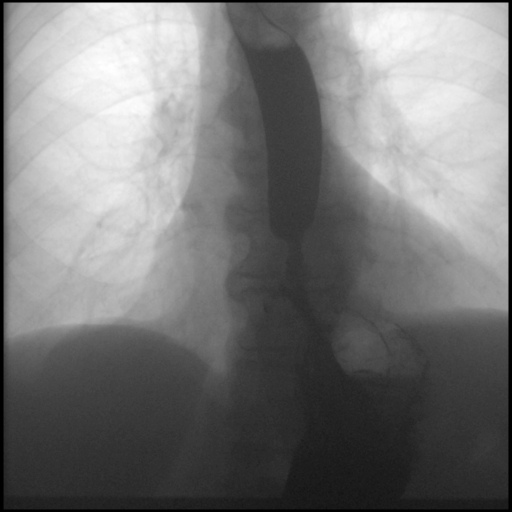

[Series 9: cp_standard · 0.18mm/px · 1 of 1 slices shown (7 of 7)]
[im 1/1]
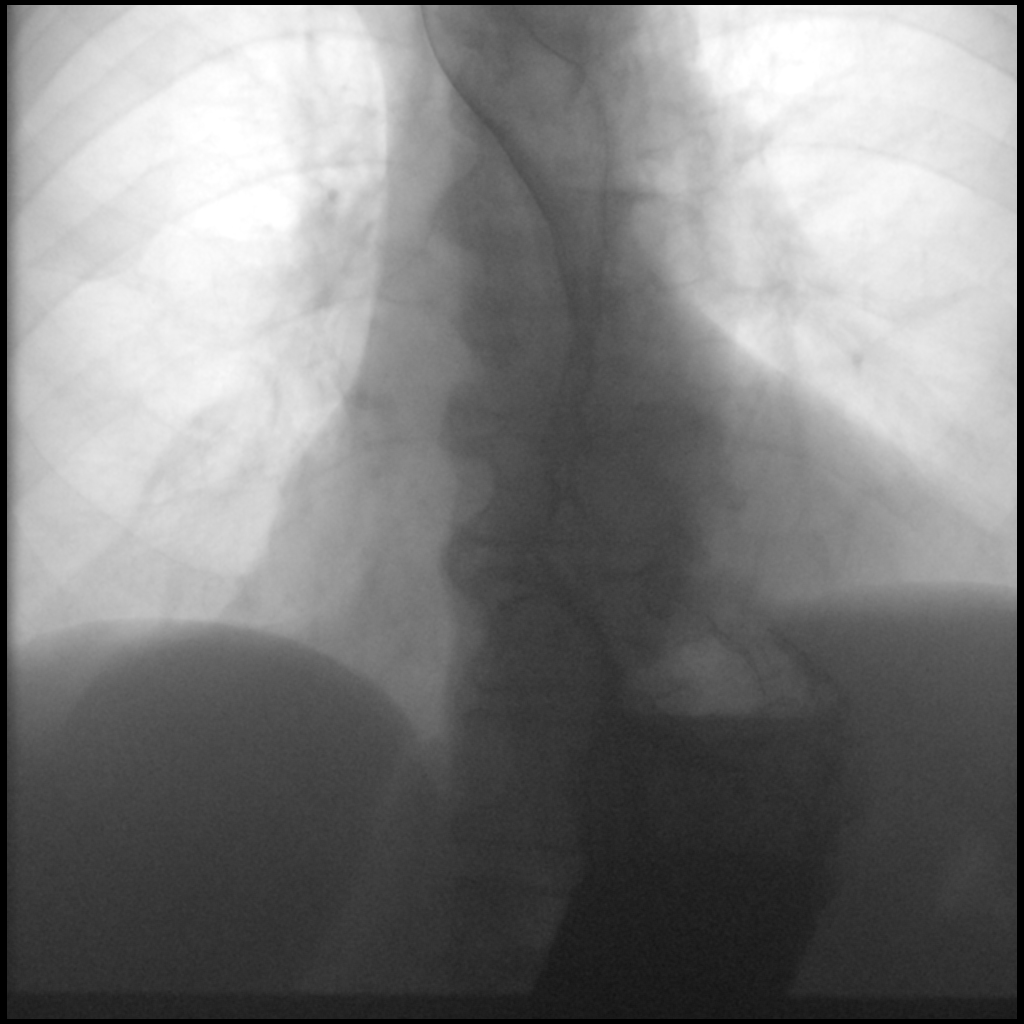

[13 of 24 positions shown; findings below may reference images not displayed]

FINDINGS: No laryngeal penetration or aspiration.

Prominent cricopharyngeus muscle with impression upon the posterior
aspect of the cervical esophagus.

Small hiatal hernia.

Smooth narrowing just above the hiatal hernia. 13 mm barium tablet
becomes temporarily lodged at this level but clears with subsequent
swallowing.

No worrisome esophageal mucosa abnormality noted.

No reflux observed.
IMPRESSION: Prominent cricopharyngeus muscle with impression upon the posterior
aspect of the cervical esophagus.

Small hiatal hernia.

Smooth narrowing just above the hiatal hernia. 13 mm barium tablet
becomes temporarily lodged at this level but clears with subsequent
swallowing.

## 2019-06-27 NOTE — Progress Notes (Signed)
HPI: Follow-up mitral stenosis. Echocardiogram January 2020 showed normal LV function, moderate mitral stenosis with mean gradient 8 mmHg and moderate pulmonary hypertension.  Nuclear study January 2019 showed ejection fraction 64% and normal perfusion.  At previous office visit we discussed options concerning mitral stenosis.  Not a candidate for balloon valvuloplasty given subvalvular calcification.  She would therefore require mitral valve replacement which I felt would be not ideal given age and underlying medical problems.  We therefore elected conservative management at this point.  Since last seen  she has dyspnea on exertion increased compared to previous.  No orthopnea, PND, pedal edema or syncope.  Occasional brief pain in her chest for 1 to 2 seconds.  Current Outpatient Medications  Medication Sig Dispense Refill  . amLODipine (NORVASC) 5 MG tablet Take 5 mg by mouth daily.    . dorzolamide-timolol (COSOPT) 22.3-6.8 MG/ML ophthalmic solution Place 1 drop into both eyes 2 (two) times daily.     . furosemide (LASIX) 40 MG tablet Take 40 mg by mouth daily.    . hydrochlorothiazide (HYDRODIURIL) 12.5 MG tablet Take 12.5 mg by mouth every morning.    . latanoprost (XALATAN) 0.005 % ophthalmic solution SMARTSIG:1 Drop(s) In Eye(s) Every Evening    . levothyroxine (SYNTHROID, LEVOTHROID) 50 MCG tablet TAKE 1 TABLET EVERY DAY 90 tablet 1  . losartan (COZAAR) 50 MG tablet Take 50 mg by mouth daily.    . pantoprazole (PROTONIX) 40 MG tablet TAKE ONE TABLET BY MOUTH ONCE DAILY BEFORE  BREAKFAST 60 tablet 0  . Probiotic Product (PROBIOTIC-10 PO) Take by mouth.    . timolol (TIMOPTIC) 0.5 % ophthalmic solution 1 drop 2 (two) times daily.    . Travoprost, BAK Free, (TRAVATAN) 0.004 % SOLN ophthalmic solution Place 1 drop into both eyes at bedtime.     No current facility-administered medications for this visit.     Past Medical History:  Diagnosis Date  . Anemia, unspecified   . Breast  cyst   . Chest pain, unspecified   . Depression   . Diarrhea of presumed infectious origin   . Diffuse cystic mastopathy   . Diverticulosis 06-2009   mild- Colonoscopy   . DJD (degenerative joint disease)   . Dysmetabolic syndrome X   . Edema   . Esophageal reflux   . Esophageal stricture   . Gout, unspecified   . Hiatal hernia   . History of gallstones   . History of Helicobacter pylori infection   . HLD (hyperlipidemia)   . HTN (hypertension)   . Hypertrophied anal papilla   . Hypopotassemia   . Hypothyroidism   . Iron deficiency anemia, unspecified   . Lung nodule   . Osteoarthritis   . Pneumonia 1993  . Solitary cyst of breast   . Type II or unspecified type diabetes mellitus without mention of complication, not stated as uncontrolled    pt. states not a diabetic  . Unspecified sinusitis (chronic)   . Vitamin B12 deficiency     Past Surgical History:  Procedure Laterality Date  . BLADDER SUSPENSION    . BREAST CYST EXCISION     left  . CATARACT EXTRACTION, BILATERAL  2011  . CHOLECYSTECTOMY  03/28/2011   Procedure: LAPAROSCOPIC CHOLECYSTECTOMY WITH INTRAOPERATIVE CHOLANGIOGRAM;  Surgeon: Imogene Burn. Georgette Dover, MD;  Location: [facility];  Service: General;  Laterality: N/A;  . ERCP  10/06/2011   Procedure: ENDOSCOPIC RETROGRADE CHOLANGIOPANCREATOGRAPHY (ERCP);  Surgeon: Inda Castle, MD;  [facility];  Service: Endoscopy;  Laterality: N/A;  . PARTIAL HYSTERECTOMY    . REPLACEMENT TOTAL KNEE     bilateral  . SHOULDER SURGERY     bilateral  . TONSILLECTOMY AND ADENOIDECTOMY    . TUBAL LIGATION      Social History   Socioeconomic History  . Marital status: Married    Spouse name: Not on file  . Number of children: 5  . Years of education: Not on file  . Highest education level: Not on file  Occupational History  . Occupation: Retired    Fish farm manager: RETIRED  Tobacco Use  . Smoking status: Former Smoker    Packs/day: 0.25    Years: 5.00    Pack years: 1.25     Types: Cigarettes    Quit date: 03/20/1988    Years since quitting: 31.3  . Smokeless tobacco: Current User    Types: Snuff  Substance and Sexual Activity  . Alcohol use: No  . Drug use: No  . Sexual activity: Not Currently  Other Topics Concern  . Not on file  Social History Narrative   Regular exercise-yes   Social Determinants of Health   Financial Resource Strain:   . Difficulty of Paying Living Expenses:   Food Insecurity:   . Worried About Charity fundraiser in the Last Year:   . Arboriculturist in the Last Year:   Transportation Needs:   . Film/video editor (Medical):   Marland Kitchen Lack of Transportation (Non-Medical):   Physical Activity:   . Days of Exercise per Week:   . Minutes of Exercise per Session:   Stress:   . Feeling of Stress :   Social Connections:   . Frequency of Communication with Friends and Family:   . Frequency of Social Gatherings with Friends and Family:   . Attends Religious Services:   . Active Member of Clubs or Organizations:   . Attends Archivist Meetings:   Marland Kitchen Marital Status:   Intimate Partner Violence:   . Fear of Current or Ex-Partner:   . Emotionally Abused:   Marland Kitchen Physically Abused:   . Sexually Abused:     Family History  Problem Relation Age of Onset  . Stroke Mother   . Heart disease Father   . Stomach cancer Brother   . Breast cancer Maternal Aunt   . Cancer Brother        bladder  . Colon cancer Brother     ROS: no fevers or chills, productive cough, hemoptysis, dysphasia, odynophagia, melena, hematochezia, dysuria, hematuria, rash, seizure activity, orthopnea, PND, pedal edema, claudication. Remaining systems are negative.  Physical Exam: Well-developed well-nourished in no acute distress.  Skin is warm and dry.  HEENT is normal.  Neck is supple.  Chest is clear to auscultation with normal expansion.  Cardiovascular exam is regular rate and rhythm.  Abdominal exam nontender or distended. No masses  palpated. Extremities show no edema. neuro grossly intact  ECG-normal sinus rhythm at a rate of 74, incomplete right bundle branch block.  Personally reviewed  A/P  1 mitral stenosis-this is felt secondary to mitral annular calcification.  Given subvalvular calcification she would not be a good candidate for balloon valvuloplasty.  Given age she would also be at high risk for mitral valve replacement and would not be agreeable in the future.  Plan conservative measures.  Repeat echocardiogram.  2 hypertension-blood pressure mildly elevated; however typically controlled.  Continue present medications.  3 history of  chest pain-no recent symptoms.  Prior nuclear study normal.  Kirk Ruths, MD

## 2019-07-01 ENCOUNTER — Encounter: Payer: Self-pay | Admitting: Cardiology

## 2019-07-01 ENCOUNTER — Other Ambulatory Visit: Payer: Self-pay

## 2019-07-01 ENCOUNTER — Ambulatory Visit: Payer: Medicare Other | Admitting: Cardiology

## 2019-07-01 VITALS — BP 150/90 | HR 76 | Temp 97.0°F | Resp 18 | Ht 60.0 in | Wt 188.8 lb

## 2019-07-01 DIAGNOSIS — I10 Essential (primary) hypertension: Secondary | ICD-10-CM

## 2019-07-01 DIAGNOSIS — R072 Precordial pain: Secondary | ICD-10-CM | POA: Diagnosis not present

## 2019-07-01 DIAGNOSIS — I342 Nonrheumatic mitral (valve) stenosis: Secondary | ICD-10-CM | POA: Diagnosis not present

## 2019-07-01 NOTE — Patient Instructions (Signed)
Medication Instructions:  NO CHANGE *If you need a refill on your cardiac medications before your next appointment, please call your pharmacy*   Lab Work: If you have labs (blood work) drawn today and your tests are completely normal, you will receive your results only by: Marland Kitchen MyChart Message (if you have MyChart) OR . A paper copy in the mail If you have any lab test that is abnormal or we need to change your treatment, we will call you to review the results.   Testing/Procedures: Your physician has requested that you have an echocardiogram. Echocardiography is a painless test that uses sound waves to create images of your heart. It provides your doctor with information about the size and shape of your heart and how well your heart's chambers and valves are working. This procedure takes approximately one hour. There are no restrictions for this procedure.[place]     Follow-Up: At [facility], you and your health needs are our priority.  As part of our continuing mission to provide you with exceptional heart care, we have created designated Provider Care Teams.  These Care Teams include your primary Cardiologist (physician) and Advanced Practice Providers (APPs -  Physician Assistants and Nurse Practitioners) who all work together to provide you with the care you need, when you need it.  We recommend signing up for the patient portal called "MyChart".  Sign up information is provided on this After Visit Summary.  MyChart is used to connect with patients for Virtual Visits (Telemedicine).  Patients are able to view lab/test results, encounter notes, upcoming appointments, etc.  Non-urgent messages can be sent to your provider as well.   To learn more about what you can do with MyChart, go to [email or web address].    Your next appointment:   12 month(s)  The format for your next appointment:   Either In Person or Virtual  Provider:   You may see Kirk Ruths MD or  one of the following Advanced Practice Providers on your designated Care Team:    Kerin Ransom, PA-C  [place], [place]  Coletta Memos, [place]

## 2019-07-16 ENCOUNTER — Other Ambulatory Visit (HOSPITAL_COMMUNITY): Payer: Medicare Other

## 2019-07-29 ENCOUNTER — Ambulatory Visit (HOSPITAL_COMMUNITY): Payer: Medicare Other | Attending: Cardiology

## 2019-07-29 DIAGNOSIS — I342 Nonrheumatic mitral (valve) stenosis: Secondary | ICD-10-CM | POA: Insufficient documentation

## 2020-05-04 ENCOUNTER — Ambulatory Visit (INDEPENDENT_AMBULATORY_CARE_PROVIDER_SITE_OTHER)
Admission: RE | Admit: 2020-05-04 | Discharge: 2020-05-04 | Disposition: A | Discharge status: Home / Self Care | Payer: Medicare Other | Source: Ambulatory Visit | Attending: Family Medicine | Admitting: Family Medicine

## 2020-05-04 ENCOUNTER — Emergency Department (HOSPITAL_COMMUNITY)
Admission: EM | Admit: 2020-05-04 | Discharge: 2020-05-04 | Disposition: A | Discharge status: Home / Self Care | Payer: Medicare Other | Attending: Emergency Medicine | Admitting: Emergency Medicine

## 2020-05-04 ENCOUNTER — Other Ambulatory Visit (HOSPITAL_COMMUNITY): Payer: Self-pay | Admitting: Family Medicine

## 2020-05-04 ENCOUNTER — Other Ambulatory Visit: Payer: Self-pay

## 2020-05-04 ENCOUNTER — Encounter (HOSPITAL_COMMUNITY): Payer: Self-pay | Admitting: Emergency Medicine

## 2020-05-04 DIAGNOSIS — Z882 Allergy status to sulfonamides status: Secondary | ICD-10-CM | POA: Insufficient documentation

## 2020-05-04 DIAGNOSIS — Z888 Allergy status to other drugs, medicaments and biological substances status: Secondary | ICD-10-CM | POA: Diagnosis not present

## 2020-05-04 DIAGNOSIS — Z881 Allergy status to other antibiotic agents status: Secondary | ICD-10-CM | POA: Diagnosis not present

## 2020-05-04 DIAGNOSIS — R229 Localized swelling, mass and lump, unspecified: Secondary | ICD-10-CM | POA: Diagnosis not present

## 2020-05-04 DIAGNOSIS — Z79899 Other long term (current) drug therapy: Secondary | ICD-10-CM | POA: Diagnosis not present

## 2020-05-04 DIAGNOSIS — I82412 Acute embolism and thrombosis of left femoral vein: Secondary | ICD-10-CM | POA: Diagnosis not present

## 2020-05-04 DIAGNOSIS — M7989 Other specified soft tissue disorders: Secondary | ICD-10-CM

## 2020-05-04 DIAGNOSIS — Z885 Allergy status to narcotic agent status: Secondary | ICD-10-CM | POA: Diagnosis not present

## 2020-05-04 DIAGNOSIS — N184 Chronic kidney disease, stage 4 (severe): Secondary | ICD-10-CM | POA: Insufficient documentation

## 2020-05-04 DIAGNOSIS — E785 Hyperlipidemia, unspecified: Secondary | ICD-10-CM | POA: Diagnosis not present

## 2020-05-04 DIAGNOSIS — Z87891 Personal history of nicotine dependence: Secondary | ICD-10-CM | POA: Insufficient documentation

## 2020-05-04 DIAGNOSIS — I129 Hypertensive chronic kidney disease with stage 1 through stage 4 chronic kidney disease, or unspecified chronic kidney disease: Secondary | ICD-10-CM | POA: Insufficient documentation

## 2020-05-04 DIAGNOSIS — E1122 Type 2 diabetes mellitus with diabetic chronic kidney disease: Secondary | ICD-10-CM | POA: Insufficient documentation

## 2020-05-04 DIAGNOSIS — E039 Hypothyroidism, unspecified: Secondary | ICD-10-CM | POA: Insufficient documentation

## 2020-05-04 DIAGNOSIS — Z96653 Presence of artificial knee joint, bilateral: Secondary | ICD-10-CM | POA: Insufficient documentation

## 2020-05-04 DIAGNOSIS — Z79890 Hormone replacement therapy (postmenopausal): Secondary | ICD-10-CM | POA: Diagnosis not present

## 2020-05-04 DIAGNOSIS — I82402 Acute embolism and thrombosis of unspecified deep veins of left lower extremity: Secondary | ICD-10-CM | POA: Diagnosis present

## 2020-05-04 LAB — COMPREHENSIVE METABOLIC PANEL
ALT: 11 U/L (ref 0–44)
AST: 18 U/L (ref 15–41)
Albumin: 3.7 g/dL (ref 3.5–5.0)
Alkaline Phosphatase: 77 U/L (ref 38–126)
Anion gap: 11 (ref 5–15)
BUN: 31 mg/dL — ABNORMAL HIGH (ref 8–23)
CO2: 25 mmol/L (ref 22–32)
Calcium: 9.6 mg/dL (ref 8.9–10.3)
Chloride: 103 mmol/L (ref 98–111)
Creatinine, Ser: 2.19 mg/dL — ABNORMAL HIGH (ref 0.44–1.00)
GFR, Estimated: 21 mL/min — ABNORMAL LOW (ref >60)
Glucose, Bld: 100 mg/dL — ABNORMAL HIGH (ref 70–99)
Potassium: 4.5 mmol/L (ref 3.5–5.1)
Sodium: 139 mmol/L (ref 135–145)
Total Bilirubin: 0.9 mg/dL (ref 0.3–1.2)
Total Protein: 7.3 g/dL (ref 6.5–8.1)

## 2020-05-04 LAB — CBC WITH DIFFERENTIAL/PLATELET
Abs Immature Granulocytes: 0.04 10*3/uL (ref 0.00–0.07)
Basophils Absolute: 0.1 10*3/uL (ref 0.0–0.1)
Basophils Relative: 1 %
Eosinophils Absolute: 0.2 10*3/uL (ref 0.0–0.5)
Eosinophils Relative: 2 %
HCT: 40.2 % (ref 36.0–46.0)
Hemoglobin: 13.5 g/dL (ref 12.0–15.0)
Immature Granulocytes: 0 %
Lymphocytes Relative: 22 %
Lymphs Abs: 2.1 10*3/uL (ref 0.7–4.0)
MCH: 31.7 pg (ref 26.0–34.0)
MCHC: 33.6 g/dL (ref 30.0–36.0)
MCV: 94.4 fL (ref 80.0–100.0)
Monocytes Absolute: 0.6 10*3/uL (ref 0.1–1.0)
Monocytes Relative: 7 %
Neutro Abs: 6.6 10*3/uL (ref 1.7–7.7)
Neutrophils Relative %: 68 %
Platelets: 349 10*3/uL (ref 150–400)
RBC: 4.26 MIL/uL (ref 3.87–5.11)
RDW: 12.7 % (ref 11.5–15.5)
WBC: 9.6 10*3/uL (ref 4.0–10.5)
nRBC: 0 % (ref 0.0–0.2)

## 2020-05-04 MED ORDER — APIXABAN (ELIQUIS) VTE STARTER PACK (10MG AND 5MG)
ORAL_TABLET | ORAL | 0 refills | Status: DC
Start: 2020-05-04 — End: 2021-10-10

## 2020-05-04 MED ORDER — APIXABAN 5 MG PO TABS
10.0000 mg | ORAL_TABLET | Freq: Once | ORAL | Status: AC
  Administered 2020-05-04: 10 mg via ORAL
  Filled 2020-05-04: qty 2

## 2020-05-04 NOTE — Discharge Instructions (Signed)
Take eliquis as prescribed.   You will need to get repeat ultrasound in a month  See your doctor for follow-up  Return to ER if you have worsening left leg swelling, trouble breathing, chest pain

## 2020-05-04 NOTE — ED Provider Notes (Signed)
[facility] EMERGENCY DEPARTMENT Provider Note   CSN: [id number] Arrival date & time: 05/04/20  1500     History Chief Complaint  Patient presents with  . dvt    Yesenia [name] is a 87 y.o. female hx of hypertension, hyperlipidemia, here presenting with left leg DVT.  Patient has been having left leg swelling for the last week or so.  Patient had outpatient [place] that showed left common femoral vein and left proximal profunda and left popliteal and left peroneal vein DVTs.  Patient has no chest pain or shortness of breath.  Patient has baseline renal insufficiency.  The history is provided by the patient.       Past Medical History:  Diagnosis Date  . Anemia, unspecified   . Breast cyst   . Chest pain, unspecified   . Depression   . Diarrhea of presumed infectious origin   . Diffuse cystic mastopathy   . Diverticulosis 06-2009   mild- Colonoscopy   . DJD (degenerative joint disease)   . Dysmetabolic syndrome X   . Edema   . Esophageal reflux   . Esophageal stricture   . Gout, unspecified   . Hiatal hernia   . History of gallstones   . History of Helicobacter pylori infection   . HLD (hyperlipidemia)   . HTN (hypertension)   . Hypertrophied anal papilla   . Hypopotassemia   . Hypothyroidism   . Iron deficiency anemia, unspecified   . Lung nodule   . Osteoarthritis   . Pneumonia 1993  . Solitary cyst of breast   . Type II or unspecified type diabetes mellitus without mention of complication, not stated as uncontrolled    pt. states not a diabetic  . Unspecified sinusitis (chronic)   . Vitamin B12 deficiency     Patient Active Problem List   Diagnosis Date Noted  . Lumbar radiculopathy 07/24/2013  . Leukocytosis, unspecified 07/22/2013  . Nocturnal leg cramps 07/18/2013  . Tinea corporis 07/18/2013  . Greater trochanteric bursitis of left hip 06/26/2013  . IBS (irritable bowel syndrome) 03/08/2013  . Kidney disease, chronic, stage IV (GFR  15-29 ml/min) (HCC) 11/19/2012  . GERD (gastroesophageal reflux disease) 07/08/2010  . Obesity 07/08/2010  . GOUT 12/14/2009  . Hyperlipidemia LDL goal < 100 11/21/2008  . VITAMIN B12 DEFICIENCY 09/07/2007  . HYPOTHYROIDISM 07/07/2007  . HYPERTENSION, MILD 07/07/2007  . DEGENERATIVE JOINT DISEASE 07/07/2007    Past Surgical History:  Procedure Laterality Date  . BLADDER SUSPENSION    . BREAST CYST EXCISION     left  . CATARACT EXTRACTION, BILATERAL  2011  . CHOLECYSTECTOMY  03/28/2011   Procedure: LAPAROSCOPIC CHOLECYSTECTOMY WITH INTRAOPERATIVE CHOLANGIOGRAM;  Surgeon: Imogene Burn. Georgette Dover, MD;  Location: [facility];  Service: General;  Laterality: N/A;  . ERCP  10/06/2011   Procedure: ENDOSCOPIC RETROGRADE CHOLANGIOPANCREATOGRAPHY (ERCP);  Surgeon: Inda Castle, MD;  Location: [facility];  Service: Endoscopy;  Laterality: N/A;  . PARTIAL HYSTERECTOMY    . REPLACEMENT TOTAL KNEE     bilateral  . SHOULDER SURGERY     bilateral  . TONSILLECTOMY AND ADENOIDECTOMY    . TUBAL LIGATION       OB History   No obstetric history on file.     Family History  Problem Relation Age of Onset  . Stroke Mother   . Heart disease Father   . Stomach cancer Brother   . Breast cancer Maternal Aunt   . Cancer Brother  bladder  . Colon cancer Brother     Social History   Tobacco Use  . Smoking status: Former Smoker    Packs/day: 0.25    Years: 5.00    Pack years: 1.25    Types: Cigarettes    Quit date: 03/20/1988    Years since quitting: 32.1  . Smokeless tobacco: Current User    Types: Snuff  Vaping Use  . Vaping Use: Never used  Substance Use Topics  . Alcohol use: No  . Drug use: No    Home Medications Prior to Admission medications   Medication Sig Start Date End Date Taking? Authorizing Provider  amLODipine (NORVASC) 5 MG tablet Take 5 mg by mouth daily. 10/16/17   [provider]  dorzolamide-timolol (COSOPT) 22.3-6.8 MG/ML ophthalmic solution Place 1 drop into both  eyes 2 (two) times daily.  11/18/10   [provider]  furosemide (LASIX) 40 MG tablet Take 40 mg by mouth daily. 10/16/17   [provider]  hydrochlorothiazide (HYDRODIURIL) 12.5 MG tablet Take 12.5 mg by mouth every morning. 05/08/19   [provider]  latanoprost (XALATAN) 0.005 % ophthalmic solution SMARTSIG:1 Drop(s) In Eye(s) Every Evening 05/30/19   [provider]  levothyroxine (SYNTHROID, LEVOTHROID) 50 MCG tablet TAKE 1 TABLET EVERY DAY 11/27/13   Janith Lima, MD  losartan (COZAAR) 50 MG tablet Take 50 mg by mouth daily. 06/27/19   [provider]  pantoprazole (PROTONIX) 40 MG tablet TAKE ONE TABLET BY MOUTH ONCE DAILY BEFORE  BREAKFAST 09/10/15   Milus Banister, MD  Probiotic Product (PROBIOTIC-10 PO) Take by mouth.    [provider]  timolol (TIMOPTIC) 0.5 % ophthalmic solution 1 drop 2 (two) times daily. 05/30/19   [provider]  Travoprost, BAK Free, (TRAVATAN) 0.004 % SOLN ophthalmic solution Place 1 drop into both eyes at bedtime.    [provider]    Allergies    Cephalexin, Codeine, Gabapentin, Sulfamethoxazole-trimethoprim, Sulfonamide derivatives, and Vicodin [hydrocodone-acetaminophen]  Review of Systems   Review of Systems  Cardiovascular: Positive for leg swelling.  All other systems reviewed and are negative.   Physical Exam Updated Vital Signs BP 114/87   Pulse 72   Temp 98 F (36.7 C) (Oral)   Resp (!) 21   SpO2 100%   Physical Exam Vitals and nursing note reviewed.  HENT:     Head: Normocephalic.     Nose: Nose normal.     Mouth/Throat:     Mouth: Mucous membranes are moist.  Eyes:     Extraocular Movements: Extraocular movements intact.     Pupils: Pupils are equal, round, and reactive to light.  Cardiovascular:     Rate and Rhythm: Normal rate and regular rhythm.     Pulses: Normal pulses.     Heart sounds: Normal heart sounds.  Pulmonary:     Breath sounds: Normal  breath sounds.  Abdominal:     General: Abdomen is flat.     Palpations: Abdomen is soft.  Musculoskeletal:     Cervical back: Normal range of motion.     Comments: 2+ edema left leg, good peripheral pulses  Skin:    General: Skin is warm.     Capillary Refill: Capillary refill takes less than 2 seconds.  Neurological:     General: No focal deficit present.     Mental Status: She is alert and oriented to person, place, and time.  Psychiatric:        Mood  and Affect: Mood normal.        Behavior: Behavior normal.     ED Results / Procedures / Treatments   Labs (all labs ordered are listed, but only abnormal results are displayed) Labs Reviewed  COMPREHENSIVE METABOLIC PANEL - Abnormal; Notable for the following components:      Result Value   Glucose, Bld 100 (*)    BUN 31 (*)    Creatinine, Ser 2.19 (*)    GFR, Estimated 21 (*)    All other components within normal limits  CBC WITH DIFFERENTIAL/PLATELET    EKG None  Radiology VAS [place] LOWER EXTREMITY VENOUS (DVT)  Result Date: 05/04/2020  Lower Venous DVT Study Indications: Pain, and Edema.  Comparison Study: none Performing Technologist: June Leap RDMS, RVT  Examination Guidelines: A complete evaluation includes B-mode imaging, spectral Doppler, color Doppler, and power Doppler as needed of all accessible portions of each vessel. Bilateral testing is considered an integral part of a complete examination. Limited examinations for reoccurring indications may be performed as noted. The reflux portion of the exam is performed with the patient in reverse Trendelenburg.  +-----+---------------+---------+-----------+----------+--------------+ RIGHTCompressibilityPhasicitySpontaneityPropertiesThrombus Aging +-----+---------------+---------+-----------+----------+--------------+ CFV  Full           Yes      Yes                                 +-----+---------------+---------+-----------+----------+--------------+    +---------+---------------+---------+-----------+----------+--------------+ LEFT     CompressibilityPhasicitySpontaneityPropertiesThrombus Aging +---------+---------------+---------+-----------+----------+--------------+ CFV      None           No       No                                  +---------+---------------+---------+-----------+----------+--------------+ SFJ      None           No       No                                  +---------+---------------+---------+-----------+----------+--------------+ FV Prox  None                                                        +---------+---------------+---------+-----------+----------+--------------+ FV Mid   None                                                        +---------+---------------+---------+-----------+----------+--------------+ FV DistalNone                                                        +---------+---------------+---------+-----------+----------+--------------+ PFV      None                                                        +---------+---------------+---------+-----------+----------+--------------+  POP      None           No       No                                  +---------+---------------+---------+-----------+----------+--------------+ PTV      None                                                        +---------+---------------+---------+-----------+----------+--------------+ PERO     None                                                        +---------+---------------+---------+-----------+----------+--------------+ Ex Iliac None           No       No                   distal segment +---------+---------------+---------+-----------+----------+--------------+     Summary: RIGHT: - No evidence of common femoral vein obstruction.  LEFT: - Findings consistent with acute deep vein thrombosis involving the left common femoral vein, SF junction, left femoral  vein, left proximal profunda vein, left popliteal vein, left posterior tibial veins, left peroneal veins, and Distal Iliac vein.  *See table(s) above for measurements and observations. Electronically signed by Ruta Hinds MD on 05/04/2020 at 5:38:25 PM.    Final     Procedures Procedures   Medications Ordered in ED Medications  apixaban (ELIQUIS) tablet 10 mg (has no administration in time range)    ED Course  I have reviewed the triage vital signs and the nursing notes.  Pertinent labs & imaging results that were available during my care of the patient were reviewed by me and considered in my medical decision making (see chart for details).    MDM Rules/Calculators/A&P                         LEONILDA CABREROS is a 87 y.o. female here presenting with left leg pain.  Reviewed the ultrasound report which showed extensive DVT from the left femoral down to the popliteal and calf veins.  Patient has baseline creatinine of 2.  She has no shortness of breath or tachycardia or hypoxia to suggest PE.  Due to her renal insufficiency, she will qualify for Eliquis.  She was given a dose in the ED and will start her on Eliquis starter pack.  She will likely need a repeat ultrasound in about a month    Final Clinical Impression(s) / ED Diagnoses Final diagnoses:  None    Rx / DC Orders ED Discharge Orders    None       Drenda Freeze, MD 05/04/20 2009

## 2020-05-04 NOTE — ED Triage Notes (Addendum)
Pt arrives to ED after having a DVT confirmed to left leg, she reports pain and swelling to left leg since last Thursday 2/17, She denies any sob or cp.  Per report * extensive dvt to left leg.

## 2020-05-04 NOTE — Progress Notes (Signed)
Extensive DVT LLE.  Patient instructed per Dr. Lona Kettle to go to ED for treatment.  Spoke with Sharee Pimple at office.   June Leap, BS, RDMS, RVT

## 2020-05-12 DIAGNOSIS — I82409 Acute embolism and thrombosis of unspecified deep veins of unspecified lower extremity: Secondary | ICD-10-CM | POA: Diagnosis not present

## 2020-05-12 DIAGNOSIS — L03116 Cellulitis of left lower limb: Secondary | ICD-10-CM | POA: Diagnosis not present

## 2020-06-09 DIAGNOSIS — Z23 Encounter for immunization: Secondary | ICD-10-CM | POA: Diagnosis not present

## 2020-06-09 DIAGNOSIS — I872 Venous insufficiency (chronic) (peripheral): Secondary | ICD-10-CM | POA: Diagnosis not present

## 2020-06-09 DIAGNOSIS — E7800 Pure hypercholesterolemia, unspecified: Secondary | ICD-10-CM | POA: Diagnosis not present

## 2020-06-09 DIAGNOSIS — N184 Chronic kidney disease, stage 4 (severe): Secondary | ICD-10-CM | POA: Diagnosis not present

## 2020-06-09 DIAGNOSIS — K219 Gastro-esophageal reflux disease without esophagitis: Secondary | ICD-10-CM | POA: Diagnosis not present

## 2020-06-09 DIAGNOSIS — I10 Essential (primary) hypertension: Secondary | ICD-10-CM | POA: Diagnosis not present

## 2020-06-09 DIAGNOSIS — Z0000 Encounter for general adult medical examination without abnormal findings: Secondary | ICD-10-CM | POA: Diagnosis not present

## 2020-06-09 DIAGNOSIS — E039 Hypothyroidism, unspecified: Secondary | ICD-10-CM | POA: Diagnosis not present

## 2020-06-09 DIAGNOSIS — M810 Age-related osteoporosis without current pathological fracture: Secondary | ICD-10-CM | POA: Diagnosis not present

## 2020-06-09 DIAGNOSIS — I8310 Varicose veins of unspecified lower extremity with inflammation: Secondary | ICD-10-CM | POA: Diagnosis not present

## 2020-07-07 DIAGNOSIS — I82402 Acute embolism and thrombosis of unspecified deep veins of left lower extremity: Secondary | ICD-10-CM | POA: Diagnosis not present

## 2020-07-07 DIAGNOSIS — N183 Chronic kidney disease, stage 3 unspecified: Secondary | ICD-10-CM | POA: Diagnosis not present

## 2020-07-07 DIAGNOSIS — N184 Chronic kidney disease, stage 4 (severe): Secondary | ICD-10-CM | POA: Diagnosis not present

## 2020-07-07 DIAGNOSIS — K219 Gastro-esophageal reflux disease without esophagitis: Secondary | ICD-10-CM | POA: Diagnosis not present

## 2020-07-07 DIAGNOSIS — I129 Hypertensive chronic kidney disease with stage 1 through stage 4 chronic kidney disease, or unspecified chronic kidney disease: Secondary | ICD-10-CM | POA: Diagnosis not present

## 2020-07-07 DIAGNOSIS — N390 Urinary tract infection, site not specified: Secondary | ICD-10-CM | POA: Diagnosis not present

## 2020-11-02 DIAGNOSIS — N184 Chronic kidney disease, stage 4 (severe): Secondary | ICD-10-CM | POA: Diagnosis not present

## 2020-11-09 DIAGNOSIS — I129 Hypertensive chronic kidney disease with stage 1 through stage 4 chronic kidney disease, or unspecified chronic kidney disease: Secondary | ICD-10-CM | POA: Diagnosis not present

## 2020-11-09 DIAGNOSIS — K219 Gastro-esophageal reflux disease without esophagitis: Secondary | ICD-10-CM | POA: Diagnosis not present

## 2020-11-09 DIAGNOSIS — N1832 Chronic kidney disease, stage 3b: Secondary | ICD-10-CM | POA: Diagnosis not present

## 2020-11-09 DIAGNOSIS — I82402 Acute embolism and thrombosis of unspecified deep veins of left lower extremity: Secondary | ICD-10-CM | POA: Diagnosis not present

## 2020-11-20 DIAGNOSIS — R2689 Other abnormalities of gait and mobility: Secondary | ICD-10-CM | POA: Diagnosis not present

## 2020-11-20 DIAGNOSIS — I872 Venous insufficiency (chronic) (peripheral): Secondary | ICD-10-CM | POA: Diagnosis not present

## 2020-11-20 DIAGNOSIS — Z86718 Personal history of other venous thrombosis and embolism: Secondary | ICD-10-CM | POA: Diagnosis not present

## 2020-11-20 DIAGNOSIS — Z23 Encounter for immunization: Secondary | ICD-10-CM | POA: Diagnosis not present

## 2020-11-20 DIAGNOSIS — R609 Edema, unspecified: Secondary | ICD-10-CM | POA: Diagnosis not present

## 2020-11-20 DIAGNOSIS — I10 Essential (primary) hypertension: Secondary | ICD-10-CM | POA: Diagnosis not present

## 2021-05-21 DIAGNOSIS — K219 Gastro-esophageal reflux disease without esophagitis: Secondary | ICD-10-CM | POA: Diagnosis not present

## 2021-05-21 DIAGNOSIS — Z0000 Encounter for general adult medical examination without abnormal findings: Secondary | ICD-10-CM | POA: Diagnosis not present

## 2021-05-21 DIAGNOSIS — N184 Chronic kidney disease, stage 4 (severe): Secondary | ICD-10-CM | POA: Diagnosis not present

## 2021-05-21 DIAGNOSIS — M810 Age-related osteoporosis without current pathological fracture: Secondary | ICD-10-CM | POA: Diagnosis not present

## 2021-05-21 DIAGNOSIS — E039 Hypothyroidism, unspecified: Secondary | ICD-10-CM | POA: Diagnosis not present

## 2021-05-21 DIAGNOSIS — M179 Osteoarthritis of knee, unspecified: Secondary | ICD-10-CM | POA: Diagnosis not present

## 2021-05-21 DIAGNOSIS — I10 Essential (primary) hypertension: Secondary | ICD-10-CM | POA: Diagnosis not present

## 2021-05-21 DIAGNOSIS — E7800 Pure hypercholesterolemia, unspecified: Secondary | ICD-10-CM | POA: Diagnosis not present

## 2021-06-07 DIAGNOSIS — K219 Gastro-esophageal reflux disease without esophagitis: Secondary | ICD-10-CM | POA: Diagnosis not present

## 2021-06-07 DIAGNOSIS — N184 Chronic kidney disease, stage 4 (severe): Secondary | ICD-10-CM | POA: Diagnosis not present

## 2021-06-07 DIAGNOSIS — I82402 Acute embolism and thrombosis of unspecified deep veins of left lower extremity: Secondary | ICD-10-CM | POA: Diagnosis not present

## 2021-06-07 DIAGNOSIS — I129 Hypertensive chronic kidney disease with stage 1 through stage 4 chronic kidney disease, or unspecified chronic kidney disease: Secondary | ICD-10-CM | POA: Diagnosis not present

## 2021-10-08 ENCOUNTER — Emergency Department (HOSPITAL_BASED_OUTPATIENT_CLINIC_OR_DEPARTMENT_OTHER): Payer: Medicare Other

## 2021-10-08 ENCOUNTER — Encounter (HOSPITAL_COMMUNITY): Payer: Self-pay

## 2021-10-08 ENCOUNTER — Emergency Department (HOSPITAL_COMMUNITY): Payer: Medicare Other

## 2021-10-08 ENCOUNTER — Observation Stay (HOSPITAL_COMMUNITY)
Admission: EM | Admit: 2021-10-08 | Discharge: 2021-10-10 | Disposition: A | Discharge status: Home / Self Care | Payer: Medicare Other | Attending: Internal Medicine | Admitting: Internal Medicine

## 2021-10-08 ENCOUNTER — Other Ambulatory Visit: Payer: Self-pay

## 2021-10-08 DIAGNOSIS — I129 Hypertensive chronic kidney disease with stage 1 through stage 4 chronic kidney disease, or unspecified chronic kidney disease: Secondary | ICD-10-CM | POA: Insufficient documentation

## 2021-10-08 DIAGNOSIS — R079 Chest pain, unspecified: Secondary | ICD-10-CM

## 2021-10-08 DIAGNOSIS — Z7982 Long term (current) use of aspirin: Secondary | ICD-10-CM | POA: Insufficient documentation

## 2021-10-08 DIAGNOSIS — E1122 Type 2 diabetes mellitus with diabetic chronic kidney disease: Secondary | ICD-10-CM | POA: Insufficient documentation

## 2021-10-08 DIAGNOSIS — K219 Gastro-esophageal reflux disease without esophagitis: Secondary | ICD-10-CM | POA: Diagnosis present

## 2021-10-08 DIAGNOSIS — Z743 Need for continuous supervision: Secondary | ICD-10-CM | POA: Diagnosis not present

## 2021-10-08 DIAGNOSIS — Z20822 Contact with and (suspected) exposure to covid-19: Secondary | ICD-10-CM | POA: Diagnosis not present

## 2021-10-08 DIAGNOSIS — E669 Obesity, unspecified: Secondary | ICD-10-CM | POA: Diagnosis not present

## 2021-10-08 DIAGNOSIS — Z87891 Personal history of nicotine dependence: Secondary | ICD-10-CM | POA: Diagnosis not present

## 2021-10-08 DIAGNOSIS — Z86718 Personal history of other venous thrombosis and embolism: Secondary | ICD-10-CM | POA: Insufficient documentation

## 2021-10-08 DIAGNOSIS — I499 Cardiac arrhythmia, unspecified: Secondary | ICD-10-CM | POA: Diagnosis not present

## 2021-10-08 DIAGNOSIS — Z79899 Other long term (current) drug therapy: Secondary | ICD-10-CM | POA: Diagnosis not present

## 2021-10-08 DIAGNOSIS — I2699 Other pulmonary embolism without acute cor pulmonale: Principal | ICD-10-CM

## 2021-10-08 DIAGNOSIS — I3139 Other pericardial effusion (noninflammatory): Secondary | ICD-10-CM | POA: Diagnosis not present

## 2021-10-08 DIAGNOSIS — Z96653 Presence of artificial knee joint, bilateral: Secondary | ICD-10-CM | POA: Insufficient documentation

## 2021-10-08 DIAGNOSIS — Z6835 Body mass index (BMI) 35.0-35.9, adult: Secondary | ICD-10-CM | POA: Diagnosis not present

## 2021-10-08 DIAGNOSIS — K449 Diaphragmatic hernia without obstruction or gangrene: Secondary | ICD-10-CM | POA: Diagnosis not present

## 2021-10-08 DIAGNOSIS — J90 Pleural effusion, not elsewhere classified: Secondary | ICD-10-CM | POA: Diagnosis not present

## 2021-10-08 DIAGNOSIS — R6889 Other general symptoms and signs: Secondary | ICD-10-CM | POA: Diagnosis not present

## 2021-10-08 DIAGNOSIS — I160 Hypertensive urgency: Secondary | ICD-10-CM | POA: Diagnosis not present

## 2021-10-08 DIAGNOSIS — R0781 Pleurodynia: Secondary | ICD-10-CM

## 2021-10-08 DIAGNOSIS — E039 Hypothyroidism, unspecified: Secondary | ICD-10-CM | POA: Diagnosis present

## 2021-10-08 DIAGNOSIS — E785 Hyperlipidemia, unspecified: Secondary | ICD-10-CM | POA: Diagnosis present

## 2021-10-08 DIAGNOSIS — I10 Essential (primary) hypertension: Secondary | ICD-10-CM | POA: Diagnosis present

## 2021-10-08 DIAGNOSIS — N184 Chronic kidney disease, stage 4 (severe): Secondary | ICD-10-CM | POA: Diagnosis not present

## 2021-10-08 DIAGNOSIS — R0789 Other chest pain: Secondary | ICD-10-CM | POA: Diagnosis not present

## 2021-10-08 LAB — TROPONIN I (HIGH SENSITIVITY)
Troponin I (High Sensitivity): 12 ng/L (ref <18)
Troponin I (High Sensitivity): 14 ng/L (ref <18)

## 2021-10-08 LAB — D-DIMER, QUANTITATIVE: D-Dimer, Quant: 3.62 ug/mL-FEU — ABNORMAL HIGH (ref 0.00–0.50)

## 2021-10-08 LAB — PROCALCITONIN: Procalcitonin: <0.1 ng/mL

## 2021-10-08 LAB — BASIC METABOLIC PANEL
Anion gap: 8 (ref 5–15)
BUN: 23 mg/dL (ref 8–23)
CO2: 28 mmol/L (ref 22–32)
Calcium: 9.2 mg/dL (ref 8.9–10.3)
Chloride: 103 mmol/L (ref 98–111)
Creatinine, Ser: 1.73 mg/dL — ABNORMAL HIGH (ref 0.44–1.00)
GFR, Estimated: 28 mL/min — ABNORMAL LOW (ref >60)
Glucose, Bld: 109 mg/dL — ABNORMAL HIGH (ref 70–99)
Potassium: 4.2 mmol/L (ref 3.5–5.1)
Sodium: 139 mmol/L (ref 135–145)

## 2021-10-08 LAB — ECHOCARDIOGRAM LIMITED
Area-P 1/2: 1.72 cm2
Height: 60 in
S' Lateral: 2.5 cm
Weight: 2928 oz

## 2021-10-08 LAB — PROTIME-INR
INR: 1.1 (ref 0.8–1.2)
Prothrombin Time: 13.7 seconds (ref 11.4–15.2)

## 2021-10-08 LAB — APTT: aPTT: 21 seconds — ABNORMAL LOW (ref 24–36)

## 2021-10-08 LAB — CBC
HCT: 41.3 % (ref 36.0–46.0)
Hemoglobin: 13.6 g/dL (ref 12.0–15.0)
MCH: 31.9 pg (ref 26.0–34.0)
MCHC: 32.9 g/dL (ref 30.0–36.0)
MCV: 96.9 fL (ref 80.0–100.0)
Platelets: 245 10*3/uL (ref 150–400)
RBC: 4.26 MIL/uL (ref 3.87–5.11)
RDW: 13.1 % (ref 11.5–15.5)
WBC: 10.7 10*3/uL — ABNORMAL HIGH (ref 4.0–10.5)
nRBC: 0 % (ref 0.0–0.2)

## 2021-10-08 LAB — LACTIC ACID, PLASMA: Lactic Acid, Venous: 1.5 mmol/L (ref 0.5–1.9)

## 2021-10-08 LAB — BRAIN NATRIURETIC PEPTIDE: B Natriuretic Peptide: 169 pg/mL — ABNORMAL HIGH (ref 0.0–100.0)

## 2021-10-08 LAB — SARS CORONAVIRUS 2 BY RT PCR: SARS Coronavirus 2 by RT PCR: NEGATIVE

## 2021-10-08 MED ORDER — LATANOPROST 0.005 % OP SOLN
1.0000 [drp] | Freq: Every day | OPHTHALMIC | Status: DC
Start: 2021-10-08 — End: 2021-10-10
  Administered 2021-10-09: 1 [drp] via OPHTHALMIC
  Filled 2021-10-08: qty 2.5

## 2021-10-08 MED ORDER — PANTOPRAZOLE SODIUM 40 MG PO TBEC
40.0000 mg | DELAYED_RELEASE_TABLET | Freq: Every day | ORAL | Status: DC
  Administered 2021-10-08 – 2021-10-10 (×3): 40 mg via ORAL
  Filled 2021-10-08 (×3): qty 1

## 2021-10-08 MED ORDER — ACETAMINOPHEN 650 MG RE SUPP: 650.0000 mg | Freq: Four times a day (QID) | RECTAL | Status: DC | PRN

## 2021-10-08 MED ORDER — LOSARTAN POTASSIUM 50 MG PO TABS: 50.0000 mg | ORAL_TABLET | Freq: Every day | ORAL | Status: DC

## 2021-10-08 MED ORDER — ACETAMINOPHEN 325 MG PO TABS
650.0000 mg | ORAL_TABLET | Freq: Four times a day (QID) | ORAL | Status: DC | PRN
  Administered 2021-10-08 – 2021-10-09 (×2): 650 mg via ORAL
  Filled 2021-10-08 (×2): qty 2

## 2021-10-08 MED ORDER — ONDANSETRON HCL 4 MG PO TABS
4.0000 mg | ORAL_TABLET | Freq: Four times a day (QID) | ORAL | Status: DC | PRN
  Administered 2021-10-10: 4 mg via ORAL
  Filled 2021-10-08: qty 1

## 2021-10-08 MED ORDER — IOHEXOL 350 MG/ML SOLN
70.0000 mL | Freq: Once | INTRAVENOUS | Status: AC | PRN
  Administered 2021-10-08: 70 mL via INTRAVENOUS

## 2021-10-08 MED ORDER — LATANOPROST 0.005 % OP SOLN: 1.0000 [drp] | Freq: Every day | OPHTHALMIC | Status: DC

## 2021-10-08 MED ORDER — FUROSEMIDE 20 MG PO TABS: 40.0000 mg | ORAL_TABLET | Freq: Every day | ORAL | Status: DC

## 2021-10-08 MED ORDER — HEPARIN (PORCINE) 25000 UT/250ML-% IV SOLN
1100.0000 [IU]/h | INTRAVENOUS | Status: DC
  Administered 2021-10-08: 1100 [IU]/h via INTRAVENOUS
  Filled 2021-10-08: qty 250

## 2021-10-08 MED ORDER — SODIUM CHLORIDE 0.9 % IV SOLN: INTRAVENOUS | Status: DC

## 2021-10-08 MED ORDER — HEPARIN BOLUS VIA INFUSION
3500.0000 [IU] | Freq: Once | INTRAVENOUS | Status: AC
  Administered 2021-10-08: 3500 [IU] via INTRAVENOUS
  Filled 2021-10-08: qty 3500

## 2021-10-08 MED ORDER — AMLODIPINE BESYLATE 5 MG PO TABS: 5.0000 mg | ORAL_TABLET | Freq: Every day | ORAL | Status: DC

## 2021-10-08 MED ORDER — MELATONIN 3 MG PO TABS
3.0000 mg | ORAL_TABLET | Freq: Every day | ORAL | Status: DC
  Administered 2021-10-08 – 2021-10-09 (×2): 3 mg via ORAL
  Filled 2021-10-08 (×2): qty 1

## 2021-10-08 MED ORDER — HYDROCHLOROTHIAZIDE 12.5 MG PO TABS: 12.5000 mg | ORAL_TABLET | Freq: Every morning | ORAL | Status: DC

## 2021-10-08 MED ORDER — TIMOLOL MALEATE 0.5 % OP SOLN: 1.0000 [drp] | Freq: Two times a day (BID) | OPHTHALMIC | Status: DC

## 2021-10-08 MED ORDER — ALPRAZOLAM 0.25 MG PO TABS: 0.2500 mg | ORAL_TABLET | Freq: Three times a day (TID) | ORAL | Status: DC | PRN

## 2021-10-08 MED ORDER — DORZOLAMIDE HCL-TIMOLOL MAL 2-0.5 % OP SOLN
1.0000 [drp] | Freq: Two times a day (BID) | OPHTHALMIC | Status: DC
  Administered 2021-10-09 – 2021-10-10 (×2): 1 [drp] via OPHTHALMIC
  Filled 2021-10-08: qty 10

## 2021-10-08 MED ORDER — LEVOTHYROXINE SODIUM 50 MCG PO TABS
50.0000 ug | ORAL_TABLET | Freq: Every day | ORAL | Status: DC
  Administered 2021-10-09 – 2021-10-10 (×2): 50 ug via ORAL
  Filled 2021-10-08: qty 1

## 2021-10-08 MED ORDER — SODIUM CHLORIDE 0.9 % IV BOLUS
500.0000 mL | Freq: Once | INTRAVENOUS | Status: AC
Start: 2021-10-08 — End: 2021-10-08
  Administered 2021-10-08: 500 mL via INTRAVENOUS

## 2021-10-08 MED ORDER — ONDANSETRON HCL 4 MG/2ML IJ SOLN: 4.0000 mg | Freq: Four times a day (QID) | INTRAMUSCULAR | Status: DC | PRN

## 2021-10-08 MED ORDER — HYDROMORPHONE HCL 1 MG/ML IJ SOLN
0.2500 mg | INTRAMUSCULAR | Status: DC | PRN
  Filled 2021-10-08: qty 0.5

## 2021-10-08 NOTE — H&P (Signed)
History and Physical    Yesenia [name] DHR:[id number] DOB: 1929-11-30 DOA: 10/08/2021  PCP: Lawerance Cruel, MD (Confirm with patient/family/NH records and if not entered, this has to be entered at [facility] point of entry) Patient coming from: Home  I have personally briefly reviewed patient's old medical records in [place]  Chief Complaint: Chest pain  HPI: Yesenia [name] is a 88 y.o. female with medical history significant of DVT off Eliquis, HTN, CKD stage IIIb, GERD, HLD, hypothyroidism, presented with worsening of chest pains.  Patient was diagnosed with left leg DVT in 2021 and was treated for full dose of Eliquis for about 4-monththen switched to prophylactic dosage of Eliquis for another 3 months and discontinued in 2022.  Recently patient developed intermittent left-sided chest pain, worsening with cough and upper torso movement, denies any hemoptysis, no fever or chills.  No leg swelling.  ED Course: No tachycardia, blood pressure elevated no hypoxia.  CT angiogram showed left side PE with signs of right heart strain on CT angiogram.  Creatinine 1.7 compared to baseline 1.8.  Review of Systems: As per HPI otherwise 14 point review of systems negative.    Past Medical History:  Diagnosis Date   Anemia, unspecified    Breast cyst    Chest pain, unspecified    Depression    Diarrhea of presumed infectious origin    Diffuse cystic mastopathy    Diverticulosis 06-2009   mild- Colonoscopy    DJD (degenerative joint disease)    Dysmetabolic syndrome X    Edema    Esophageal reflux    Esophageal stricture    Gout, unspecified    Hiatal hernia    History of gallstones    History of Helicobacter pylori infection    HLD (hyperlipidemia)    HTN (hypertension)    Hypertrophied anal papilla    Hypopotassemia    Hypothyroidism    Iron deficiency anemia, unspecified    Lung nodule    Osteoarthritis    Pneumonia 1993   Solitary cyst of breast    Type II or  unspecified type diabetes mellitus without mention of complication, not stated as uncontrolled    pt. states not a diabetic   Unspecified sinusitis (chronic)    Vitamin B12 deficiency     Past Surgical History:  Procedure Laterality Date   BLADDER SUSPENSION     BREAST CYST EXCISION     left   CATARACT EXTRACTION, BILATERAL  2011   CHOLECYSTECTOMY  03/28/2011   Procedure: LAPAROSCOPIC CHOLECYSTECTOMY WITH INTRAOPERATIVE CHOLANGIOGRAM;  Surgeon: MImogene Burn TGeorgette Dover MD;  Location: [facility];  Service: General;  Laterality: N/A;   ERCP  10/06/2011   Procedure: ENDOSCOPIC RETROGRADE CHOLANGIOPANCREATOGRAPHY (ERCP);  Surgeon: RInda Castle MD;  [facility]  Service: Endoscopy;  Laterality: N/A;   PARTIAL HYSTERECTOMY     REPLACEMENT TOTAL KNEE     bilateral   SHOULDER SURGERY     bilateral   TONSILLECTOMY AND ADENOIDECTOMY     TUBAL LIGATION       reports that she quit smoking about 33 years ago. Her smoking use included cigarettes. She has a 1.25 pack-year smoking history. Her smokeless tobacco use includes snuff. She reports that she does not drink alcohol and does not use drugs.  Allergies  Allergen Reactions   Cephalexin     REACTION: questionable   Codeine Nausea And Vomiting   Gabapentin     Headache   Sulfamethoxazole-Trimethoprim  REACTION: unspecified   Sulfonamide Derivatives    Vicodin [Hydrocodone-Acetaminophen] Other (See Comments)    MAKES HER CRAZY     Family History  Problem Relation Age of Onset   Stroke Mother    Heart disease Father    Stomach cancer Brother    Breast cancer Maternal Aunt    Cancer Brother        bladder   Colon cancer Brother      Prior to Admission medications   Medication Sig Start Date End Date Taking? Authorizing Provider  amLODipine (NORVASC) 5 MG tablet Take 5 mg by mouth daily. 10/16/17   [provider]  APIXABAN Arne Cleveland) VTE STARTER PACK (10MG AND 5MG) Take as directed on package: start with two-48m tablets  twice daily for 7 days. On day 8, switch to one-541mtablet twice daily. 05/04/20   YaDrenda FreezeMD  dorzolamide-timolol (COSOPT) 22.3-6.8 MG/ML ophthalmic solution Place 1 drop into both eyes 2 (two) times daily.  11/18/10   [provider]  furosemide (LASIX) 40 MG tablet Take 40 mg by mouth daily. 10/16/17   [provider]  hydrochlorothiazide (HYDRODIURIL) 12.5 MG tablet Take 12.5 mg by mouth every morning. 05/08/19   [provider]  latanoprost (XALATAN) 0.005 % ophthalmic solution SMARTSIG:1 Drop(s) In Eye(s) Every Evening 05/30/19   [provider]  levothyroxine (SYNTHROID, LEVOTHROID) 50 MCG tablet TAKE 1 TABLET EVERY DAY 11/27/13   JoJanith LimaMD  losartan (COZAAR) 50 MG tablet Take 50 mg by mouth daily. 06/27/19   [provider]  pantoprazole (PROTONIX) 40 MG tablet TAKE ONE TABLET BY MOUTH ONCE DAILY BEFORE  BREAKFAST 09/10/15   JaMilus BanisterMD  Probiotic Product (PROBIOTIC-10 PO) Take by mouth.    [provider]  timolol (TIMOPTIC) 0.5 % ophthalmic solution 1 drop 2 (two) times daily. 05/30/19   [provider]  Travoprost, BAK Free, (TRAVATAN) 0.004 % SOLN ophthalmic solution Place 1 drop into both eyes at bedtime.    [provider]    Physical Exam: Vitals:   10/08/21 1330 10/08/21 1615 10/08/21 1726 10/08/21 1730  BP: 121/69 (!) 159/92  (!) 160/91  Pulse: 64 98 84 85  Resp: (!) 22 14  (!) 22  Temp:   98.3 F (36.8 C)   TempSrc:   Oral   SpO2: 97% 96% 96% 96%  Weight:      Height:        Constitutional: NAD, calm, comfortable Vitals:   10/08/21 1330 10/08/21 1615 10/08/21 1726 10/08/21 1730  BP: 121/69 (!) 159/92  (!) 160/91  Pulse: 64 98 84 85  Resp: (!) 22 14  (!) 22  Temp:   98.3 F (36.8 C)   TempSrc:   Oral   SpO2: 97% 96% 96% 96%  Weight:      Height:       Eyes: PERRL, lids and conjunctivae normal ENMT: Mucous membranes are moist. Posterior pharynx clear of any exudate or  lesions.Normal dentition.  Neck: normal, supple, no masses, no thyromegaly Respiratory: clear to auscultation bilaterally, no wheezing, no crackles. Normal respiratory effort. No accessory muscle use.  Cardiovascular: Regular rate and rhythm, no murmurs / rubs / gallops. No extremity edema. 2+ pedal pulses. No carotid bruits.  Abdomen: no tenderness, no masses palpated. No hepatosplenomegaly. Bowel sounds positive.  Musculoskeletal: no clubbing / cyanosis. No joint deformity upper and lower extremities. Good ROM, no contractures. Normal muscle tone.  Skin: no rashes, lesions, ulcers. No induration  Neurologic: CN 2-12 grossly intact. Sensation intact, DTR normal. Strength 5/5 in all 4.  Psychiatric: Normal judgment and insight. Alert and oriented x 3. Normal mood.     Labs on Admission: I have personally reviewed following labs and imaging studies  CBC: Recent Labs  Lab 10/08/21 1253  WBC 10.7*  HGB 13.6  HCT 41.3  MCV 96.9  PLT 831   Basic Metabolic Panel: Recent Labs  Lab 10/08/21 1253  NA 139  K 4.2  CL 103  CO2 28  GLUCOSE 109*  BUN 23  CREATININE 1.73*  CALCIUM 9.2   GFR: Estimated Creatinine Clearance: 20.6 mL/min (A) (by C-G formula based on SCr of 1.73 mg/dL (H)). Liver Function Tests: No results for input(s): "AST", "ALT", "ALKPHOS", "BILITOT", "PROT", "ALBUMIN" in the last 168 hours. No results for input(s): "LIPASE", "AMYLASE" in the last 168 hours. No results for input(s): "AMMONIA" in the last 168 hours. Coagulation Profile: Recent Labs  Lab 10/08/21 1713  INR 1.1   Cardiac Enzymes: No results for input(s): "CKTOTAL", "CKMB", "CKMBINDEX", "TROPONINI" in the last 168 hours. BNP (last 3 results) No results for input(s): "PROBNP" in the last 8760 hours. HbA1C: No results for input(s): "HGBA1C" in the last 72 hours. CBG: No results for input(s): "GLUCAP" in the last 168 hours. Lipid Profile: No results for input(s): "CHOL", "HDL", "LDLCALC", "TRIG",  "CHOLHDL", "LDLDIRECT" in the last 72 hours. Thyroid Function Tests: No results for input(s): "TSH", "T4TOTAL", "FREET4", "T3FREE", "THYROIDAB" in the last 72 hours. Anemia Panel: No results for input(s): "VITAMINB12", "FOLATE", "FERRITIN", "TIBC", "IRON", "RETICCTPCT" in the last 72 hours. Urine analysis:    Component Value Date/Time   COLORURINE LT. YELLOW 08/25/2011 0943   APPEARANCEUR CLOUDY 08/25/2011 0943   LABSPEC 1.025 08/25/2011 0943   PHURINE 5.5 08/25/2011 0943   GLUCOSEU NEGATIVE 08/25/2011 0943   HGBUR NEGATIVE 08/25/2011 0943   BILIRUBINUR NEGATIVE 08/25/2011 0943   KETONESUR NEGATIVE 08/25/2011 0943   UROBILINOGEN 0.2 08/25/2011 0943   NITRITE NEGATIVE 08/25/2011 0943   LEUKOCYTESUR NEGATIVE 08/25/2011 0943    Radiological Exams on Admission: ECHOCARDIOGRAM LIMITED  Result Date: 10/08/2021    ECHOCARDIOGRAM LIMITED REPORT   Patient Name:   Yesenia [name] Date of Exam: 10/08/2021 Medical Rec #:  [id number]       Height:       60.0 in Accession #:    [id number]      Weight:       183.0 lb Date of Birth:  [birth year]       BSA:          1.797 m Patient Age:    70 years        BP:           159/92 mmHg Patient Gender: F               HR:           86 bpm. Exam Location:  Inpatient Procedure: Limited Echo, Cardiac Doppler and Color Doppler Indications:    Pulmonary embolus  History:        Patient has prior history of Echocardiogram examinations, most                 recent 07/30/2019. Risk Factors:Hypertension and Dyslipidemia.                 Non-rheumatic mivtral valve stenosis. MAC. CKD. Hx DVT.  Sonographer:    Clayton Lefort RDCS (AE) Referring Phys: [id number] Freddi Starr  Sonographer  Comments: Suboptimal parasternal window. IMPRESSIONS  1. Left ventricular ejection fraction, by estimation, is 60 to 65%. The left ventricle has normal function. There is moderate left ventricular hypertrophy. Left ventricular diastolic parameters are consistent with Grade I diastolic dysfunction  (impaired  relaxation).  2. Right ventricular systolic function is normal. The right ventricular size is normal. There is normal pulmonary artery systolic pressure.  3. Subcostal view not well visualized; moderate pericardial effusion along the RV free wall. There is no evidence of cardiac tamponade.  4. The mitral valve is degenerative. Trivial mitral valve regurgitation. Severe mitral annular calcification.  5. Aortic valve regurgitation is not visualized.  6. The inferior vena cava is normal in size with greater than 50% respiratory variability, suggesting right atrial pressure of 3 mmHg. FINDINGS  Left Ventricle: Left ventricular ejection fraction, by estimation, is 60 to 65%. The left ventricle has normal function. There is moderate left ventricular hypertrophy. Left ventricular diastolic parameters are consistent with Grade I diastolic dysfunction (impaired relaxation). Right Ventricle: The right ventricular size is normal. Right ventricular systolic function is normal. There is normal pulmonary artery systolic pressure. The tricuspid regurgitant velocity is 2.84 m/s, and with an assumed right atrial pressure of 3 mmHg,  the estimated right ventricular systolic pressure is 16.1 mmHg. Right Atrium: Right atrial size was normal in size. Pericardium: Subcostal view not well visualized; moderate pericardial effusion along the RV free wall. There is no evidence of cardiac tamponade. Mitral Valve: The mitral valve is degenerative in appearance. Severe mitral annular calcification. Trivial mitral valve regurgitation. MV peak gradient, 20.1 mmHg. The mean mitral valve gradient is 10.0 mmHg. Tricuspid Valve: Tricuspid valve regurgitation is mild. Aortic Valve: Aortic valve regurgitation is not visualized. Pulmonic Valve: Pulmonic valve regurgitation is not visualized. Aorta: The aortic root and ascending aorta are structurally normal, with no evidence of dilitation. Venous: The inferior vena cava is normal in size with  greater than 50% respiratory variability, suggesting right atrial pressure of 3 mmHg. LEFT VENTRICLE PLAX 2D LVIDd:         3.60 cm   Diastology LVIDs:         2.50 cm   LV e' medial:    5.87 cm/s LV PW:         1.20 cm   LV E/e' medial:  27.9 LV IVS:        1.60 cm   LV e' lateral:   8.59 cm/s LVOT diam:     1.90 cm   LV E/e' lateral: 19.1 LVOT Area:     2.84 cm  IVC IVC diam: 1.20 cm LEFT ATRIUM         Index LA diam:    3.70 cm 2.06 cm/m   AORTA Ao Root diam: 2.60 cm Ao Asc diam:  2.40 cm MITRAL VALVE                TRICUSPID VALVE MV Area (PHT): 1.72 cm     TR Peak grad:   32.3 mmHg MV Peak grad:  20.1 mmHg    TR Vmax:        284.00 cm/s MV Mean grad:  10.0 mmHg MV Vmax:       2.24 m/s     SHUNTS MV Vmean:      156.0 cm/s   Systemic Diam: 1.90 cm MV Decel Time: 440 msec MV E velocity: 164.00 cm/s MV A velocity: 229.00 cm/s MV E/A ratio:  0.72 Landscape architect signed by [employer]  Date/Time: 10/08/2021/6:10:42 PM    Final    CT Angio Chest PE W and/or Wo Contrast  Result Date: 10/08/2021 CLINICAL DATA:  History lower extremity thrombus. Left chest pain, "High clinical suspicion for pulmonary embolus" EXAM: CT ANGIOGRAPHY CHEST WITH CONTRAST TECHNIQUE: Multidetector CT imaging of the chest was performed using the standard protocol during bolus administration of intravenous contrast. Multiplanar CT image reconstructions and MIPs were obtained to evaluate the vascular anatomy. RADIATION DOSE REDUCTION: This exam was performed according to the departmental dose-optimization program which includes automated exposure control, adjustment of the mA and/or kV according to patient size and/or use of iterative reconstruction technique. CONTRAST:  58m OMNIPAQUE IOHEXOL 350 MG/ML SOLN COMPARISON:  10/08/2021 chest radiograph; chest CT from 04/11/2007 FINDINGS: Cardiovascular: There is notable peripheral thrombus in the left pulmonary artery superiorly for example on image 112 of series 9. Suspected  associated occlusion of the posterior segmental branch of the left upper lobe pulmonary artery, with associated left apical airspace opacity on image 10 of series 11 which could be from pulmonary hemorrhage or infarct. Right ventricular to left ventricular ratio is 1.6. Coronary, aortic arch, and branch vessel atherosclerotic vascular disease. Dense mitral valve calcifications. Mediastinum/Nodes: Small type 1 hiatal hernia. Lungs/Pleura: Small left pleural effusion. Patchy airspace opacity posteriorly in the left upper lobe, possibly pulmonary infarct or hemorrhage given the pulmonary arterial findings. There narrowed segments of the tracheobronchial tree, raising the possibility of tracheobronchomalacia. Upper Abdomen: Exophytic fluid density right kidney upper pole lesion, probably a cyst but only partially included on today's exam. Musculoskeletal: Degenerative glenohumeral arthropathy bilaterally. Thoracic spondylosis. Grade 1 degenerative anterolisthesis at T1-2. Review of the MIP images confirms the above findings. IMPRESSION: 1. Peripheral low-density filling defect compatible with thrombus superiorly in the left pulmonary artery, appearing to occlude the apicoposterior segmental pulmonary artery branch. Although the peripheral location of the clot in the pulmonary artery would tend to argue for chronic embolus, the occlusion of the apicoposterior segmental branches, the small left pleural effusion, the patient's left eccentric chest pain, and the airspace opacity posteriorly in the left upper lobe argue for acute pulmonary embolus with possible pulmonary hemorrhage or pulmonary infarct. Positive for acute PE with CT evidence of right heart strain (RV/LV Ratio = 1.6) consistent with at least submassive (intermediate risk) PE. The presence of right heart strain has been associated with an increased risk of morbidity and mortality. Please refer to the "Code PE Focused" order set in EPIC. 2. Possible  tracheobronchomalacia. 3.  Aortic Atherosclerosis (ICD10-I70.0).  Coronary atherosclerosis. 4. Small type 1 hiatal hernia. 5. Dense mitral valve calcifications. Critical Value/emergent results were called by telephone at the time of interpretation on 10/08/2021 at 3:55 pm to provider Dr. SSherwood Gambler, who verbally acknowledged these results. Electronically Signed   By: WVan ClinesM.D.   On: 10/08/2021 16:06   DG Chest Port 1 View  Result Date: 10/08/2021 CLINICAL DATA:  Chest pain EXAM: PORTABLE CHEST 1 VIEW COMPARISON:  CT chest 04/11/2007 FINDINGS: The heart size and mediastinal contours are within normal limits. Both lungs are clear. The visualized skeletal structures are unremarkable. IMPRESSION: No active disease. Electronically Signed   By: HKathreen DevoidM.D.   On: 10/08/2021 13:03    EKG: Independently reviewed.  Sinus rhythm, chronic RBBB  Assessment/Plan Principal Problem:   Recurrent pulmonary emboli (HCC) Active Problems:   Chest pain, pleuritic   Hypothyroidism   Hyperlipidemia with target low density lipoprotein (LDL) cholesterol less than 100 mg/dL   HYPERTENSION,  MILD   GERD (gastroesophageal reflux disease)   Kidney disease, chronic, stage IV (GFR 15-29 ml/min) (HCC)   Pulmonary emboli (HCC)  (please populate well all problems here in Problem List. (For example, if patient is on BP meds at home and you resume or decide to hold them, it is a problem that needs to be her. Same for CAD, COPD, HLD and so on)  Chest pain secondary to pulmonary embolism and possible lung infarction -Echocardiogram was done this afternoon showed no signs of right heart strain and troponin level remains low.  Expect patient remains on heparin drip for tonight and if H&H stable can start patient on Eliquis tomorrow. -Given what has happened in 2021 and today, expect lifelong anticoagulation. -DVT study pending  CKD stage IIIb -Euvolemic and creatinine level stable, unfortunately patient  received IV contrast today, will give overnight IV fluid for kidney protection, close monitoring kidney function.  HTN -Stable, continue home BP meds including amlodipine, HCTZ and ACEI.  Hypothyroidism -Continue Synthroid  DVT prophylaxis: Heparin drip Code Status: DNR Family Communication: Granddaughter at bedside Disposition Plan: Expect less than 2 midnight hospital stay Consults called: PCCM Admission status: Telemetry observation   Lequita Halt MD [facility] Pager [phone]  10/08/2021, 7:08 PM

## 2021-10-08 NOTE — ED Triage Notes (Signed)
Patient bib [facility] with complaints of chest pain. Per patient the pain occurs with coughing and when she breathes. EMS gave 324 aspirin. VSS 170/100 HR 74 96%RA A&Ox4

## 2021-10-08 NOTE — Progress Notes (Signed)
ANTICOAGULATION CONSULT NOTE - Initial Consult  Pharmacy Consult for Heparin Indication: pulmonary embolus  Allergies  Allergen Reactions   Cephalexin     REACTION: questionable   Codeine Nausea And Vomiting   Gabapentin     Headache   Sulfamethoxazole-Trimethoprim     REACTION: unspecified   Sulfonamide Derivatives    Vicodin [Hydrocodone-Acetaminophen] Other (See Comments)    MAKES HER CRAZY     Patient Measurements: Height: 5' (152.4 cm) Weight: 83 kg (183 lb) IBW/kg (Calculated) : 45.5 Heparin Dosing Weight: 64.7 kg  Vital Signs: Temp: 98.3 F (36.8 C) (07/28 1211) Temp Source: Oral (07/28 1211) BP: 121/69 (07/28 1330) Pulse Rate: 64 (07/28 1330)  Labs: Recent Labs    10/08/21 1253  HGB 13.6  HCT 41.3  PLT 245  CREATININE 1.73*  TROPONINIHS 12    Estimated Creatinine Clearance: 20.6 mL/min (A) (by C-G formula based on SCr of 1.73 mg/dL (H)).   Medical History: Past Medical History:  Diagnosis Date   Anemia, unspecified    Breast cyst    Chest pain, unspecified    Depression    Diarrhea of presumed infectious origin    Diffuse cystic mastopathy    Diverticulosis 06-2009   mild- Colonoscopy    DJD (degenerative joint disease)    Dysmetabolic syndrome X    Edema    Esophageal reflux    Esophageal stricture    Gout, unspecified    Hiatal hernia    History of gallstones    History of Helicobacter pylori infection    HLD (hyperlipidemia)    HTN (hypertension)    Hypertrophied anal papilla    Hypopotassemia    Hypothyroidism    Iron deficiency anemia, unspecified    Lung nodule    Osteoarthritis    Pneumonia 1993   Solitary cyst of breast    Type II or unspecified type diabetes mellitus without mention of complication, not stated as uncontrolled    pt. states not a diabetic   Unspecified sinusitis (chronic)    Vitamin B12 deficiency     Medications:  (Not in a hospital admission)  Scheduled:   heparin  3,500 Units Intravenous Once    Infusions:   heparin     sodium chloride     PRN:   Assessment: 44 yof with a history of HTN, HLD, CKD, GERD, DVT (not on AC), and hypothyroidism. Patient is presenting with chest pain. Heparin per pharmacy consult placed for pulmonary embolus.  CTA PE w/ submassive PE (RV/LV 1.6)  Patient is not on anticoagulation prior to arrival.  Hgb 13.6; plt 245  Goal of Therapy:  Heparin level 0.3-0.7 units/ml Monitor platelets by anticoagulation protocol: Yes   Plan:  Give 3500 units bolus x 1 Start heparin infusion at 1100 units/hr Check anti-Xa level in 8 hours and daily while on heparin Continue to monitor H&H and platelets  Lorelei Pont, PharmD, BCPS 10/08/2021 4:30 PM ED Clinical Pharmacist -  [phone]

## 2021-10-08 NOTE — Progress Notes (Signed)
  Echocardiogram 2D Echocardiogram has been performed.  Yesenia [name] 10/08/2021, 6:01 PM

## 2021-10-08 NOTE — ED Notes (Signed)
IV team at bedside 

## 2021-10-08 NOTE — ED Provider Notes (Signed)
[facility] EMERGENCY DEPARTMENT Provider Note   CSN: [id number] Arrival date & time: 10/08/21  1206     History {Add pertinent medical, surgical, social history, OB history to HPI:1} Chief Complaint  Patient presents with   Chest Pain    GLENDELL SCHLOTTMAN is a 88 y.o. female.  Pt is a 88 yo female with a pmhx significant for htn, hld, ckd, gerd, dvt (no longer on eliquis) and hypothyroidism.  Pt said she has pain on the left side ("under my boob") when she takes a deep breath or coughs.  She denies f/c.  No sob.  Pt has no pain now.  She took asa and it resolved.       Home Medications Prior to Admission medications   Medication Sig Start Date End Date Taking? Authorizing Provider  amLODipine (NORVASC) 5 MG tablet Take 5 mg by mouth daily. 10/16/17   [provider]  APIXABAN Arne Cleveland) VTE STARTER PACK (10MG  AND 5MG ) Take as directed on package: start with two-5mg  tablets twice daily for 7 days. On day 8, switch to one-5mg  tablet twice daily. 05/04/20   Drenda Freeze, MD  dorzolamide-timolol (COSOPT) 22.3-6.8 MG/ML ophthalmic solution Place 1 drop into both eyes 2 (two) times daily.  11/18/10   [provider]  furosemide (LASIX) 40 MG tablet Take 40 mg by mouth daily. 10/16/17   [provider]  hydrochlorothiazide (HYDRODIURIL) 12.5 MG tablet Take 12.5 mg by mouth every morning. 05/08/19   [provider]  latanoprost (XALATAN) 0.005 % ophthalmic solution SMARTSIG:1 Drop(s) In Eye(s) Every Evening 05/30/19   [provider]  levothyroxine (SYNTHROID, LEVOTHROID) 50 MCG tablet TAKE 1 TABLET EVERY DAY 11/27/13   Janith Lima, MD  losartan (COZAAR) 50 MG tablet Take 50 mg by mouth daily. 06/27/19   [provider]  pantoprazole (PROTONIX) 40 MG tablet TAKE ONE TABLET BY MOUTH ONCE DAILY BEFORE  BREAKFAST 09/10/15   Milus Banister, MD  Probiotic Product (PROBIOTIC-10 PO) Take by mouth.    [provider]   timolol (TIMOPTIC) 0.5 % ophthalmic solution 1 drop 2 (two) times daily. 05/30/19   [provider]  Travoprost, BAK Free, (TRAVATAN) 0.004 % SOLN ophthalmic solution Place 1 drop into both eyes at bedtime.    [provider]      Allergies    Cephalexin, Codeine, Gabapentin, Sulfamethoxazole-trimethoprim, Sulfonamide derivatives, and Vicodin [hydrocodone-acetaminophen]    Review of Systems   Review of Systems  Cardiovascular:  Positive for chest pain.  All other systems reviewed and are negative.   Physical Exam Updated Vital Signs BP 121/69   Pulse 64   Temp 98.3 F (36.8 C) (Oral)   Resp (!) 22   Ht 5' (1.524 m)   Wt 83 kg   SpO2 97%   BMI 35.74 kg/m  Physical Exam Vitals and nursing note reviewed.  Constitutional:      Appearance: She is well-developed.  HENT:     Head: Normocephalic and atraumatic.  Eyes:     Extraocular Movements: Extraocular movements intact.     Pupils: Pupils are equal, round, and reactive to light.  Cardiovascular:     Rate and Rhythm: Normal rate and regular rhythm.     Heart sounds: Normal heart sounds.  Pulmonary:     Effort: Pulmonary effort is normal.     Breath sounds: Normal breath sounds.  Abdominal:     General: Bowel sounds are normal.     Palpations: Abdomen  is soft.  Musculoskeletal:        General: Normal range of motion.     Cervical back: Normal range of motion and neck supple.  Skin:    General: Skin is warm.     Capillary Refill: Capillary refill takes less than 2 seconds.     Findings: No rash.  Neurological:     Mental Status: She is alert.     ED Results / Procedures / Treatments   Labs (all labs ordered are listed, but only abnormal results are displayed) Labs Reviewed  BASIC METABOLIC PANEL - Abnormal; Notable for the following components:      Result Value   Glucose, Bld 109 (*)    Creatinine, Ser 1.73 (*)    GFR, Estimated 28 (*)    All other components within normal limits  CBC -  Abnormal; Notable for the following components:   WBC 10.7 (*)    All other components within normal limits  D-DIMER, QUANTITATIVE - Abnormal; Notable for the following components:   D-Dimer, Quant 3.62 (*)    All other components within normal limits  SARS CORONAVIRUS 2 BY RT PCR  TROPONIN I (HIGH SENSITIVITY)  TROPONIN I (HIGH SENSITIVITY)    EKG EKG Interpretation  Date/Time:  Friday October 08 2021 12:18:43 EDT Ventricular Rate:  73 PR Interval:  154 QRS Duration: 158 QT Interval:  401 QTC Calculation: 442 R Axis:   69 Text Interpretation: Sinus rhythm Right bundle branch block rbbbis new Confirmed by Isla Pence [phone]) on 10/08/2021 1:03:01 PM  Radiology DG Chest Port 1 View  Result Date: 10/08/2021 CLINICAL DATA:  Chest pain EXAM: PORTABLE CHEST 1 VIEW COMPARISON:  CT chest 04/11/2007 FINDINGS: The heart size and mediastinal contours are within normal limits. Both lungs are clear. The visualized skeletal structures are unremarkable. IMPRESSION: No active disease. Electronically Signed   By: Kathreen Devoid M.D.   On: 10/08/2021 13:03    Procedures Procedures  {Document cardiac monitor, telemetry assessment procedure when appropriate:1}  Medications Ordered in ED Medications  sodium chloride 0.9 % bolus 500 mL (has no administration in time range)    ED Course/ Medical Decision Making/ A&P                           Medical Decision Making Amount and/or Complexity of Data Reviewed Labs: ordered. Radiology: ordered.   This patient presents to the ED for concern of cp, this involves an extensive number of treatment options, and is a complaint that carries with it a high risk of complications and morbidity.  The differential diagnosis includes cardiac, pulm, gi   Co morbidities that complicate the patient evaluation  htn, hld, ckd, gerd, dvt (on eliquis) and hypothyroidism   Additional history obtained:  Additional history obtained from epic chart  review External records from outside source obtained and reviewed including family   Lab Tests:  I Ordered, and personally interpreted labs.  The pertinent results include:  cbc nl, bmp nl other than gfr low at 28, ddimer is elevated at 3.62, trop nl at 12   Imaging Studies ordered:  I ordered imaging studies including cxr and CT chest I independently visualized and interpreted imaging which showed  CXR: IMPRESSION:  No active disease.  CT chest:  I agree with the radiologist interpretation   Cardiac Monitoring:  The patient was maintained on a cardiac monitor.  I personally viewed and interpreted the cardiac monitored which showed an underlying  rhythm of: nsr   Medicines ordered and prescription drug management:  I ordered medication including ivfs  for ***  Reevaluation of the patient after these medicines showed that the patient improved I have reviewed the patients home medicines and have made adjustments as needed   Test Considered:  Ct chest   Critical Interventions:  ct   Consultations Obtained:  I requested consultation with the ***,  and discussed lab and imaging findings as well as pertinent plan - they recommend: ***   Problem List / ED Course:  CP:  atypical in nature.  Pt does have a hx of DVT and has a new RBBB on EKG with an elevated d-dimer.  She is not currently on thinners.  I believe the benefits of a cta chest to r/o PE outweigh the risks.  Pt will be given some fluids.   Reevaluation:  After the interventions noted above, I reevaluated the patient and found that they have :improved   Social Determinants of Health:  Lives at home with her daughter   Dispostion:  After consideration of the diagnostic results and the patients response to treatment, I feel that the patent would benefit from ***.    {Document critical care time when appropriate:1} {Document review of labs and clinical decision tools ie heart score, Chads2Vasc2 etc:1}   {Document your independent review of radiology images, and any outside records:1} {Document your discussion with family members, caretakers, and with consultants:1} {Document social determinants of health affecting pt's care:1} {Document your decision making why or why not admission, treatments were needed:1} Final Clinical Impression(s) / ED Diagnoses Final diagnoses:  None    Rx / DC Orders ED Discharge Orders     None

## 2021-10-08 NOTE — Consult Note (Signed)
NAME:  Yesenia [name], MRN:  [id number], DOB:  Nov 24, 1929, LOS: 0 ADMISSION DATE:  10/08/2021, CONSULTATION DATE:  7/28 REFERRING MD:  Regenia Skeeter, CHIEF COMPLAINT:  PE   History of Present Illness:  88 year old female with history as mentioned below presented to the emergency room at 7/28 brought in by EMS with chief complaint of pleuritic type left chest pain.  Initial pulse oximeter 96% on room air heart rate 74, initial troponins negative, CT angiogram obtained by emergency room physician showing low-density filling defect in the left pulmonary artery occluding the apical posterior segmental pulmonary artery branch RV/LV ratio 1.60 pulmonary asked to evaluate given submassive classification  Pertinent  Medical History  CKD stage IV, prior pulmonary embolus, hypercholesterolemia, hypothyroidism, osteoarthritis of knee, GERD, hypertension, venous insufficiency, mitral stenosis anemia, gout  Significant Hospital Events: Including procedures, antibiotic start and stop dates in addition to other pertinent events   7/28 admitted with submassive pulmonary emboli.  Initial RV/LV ratio 1.6.1. Peripheral low-density filling defect compatible with thrombus superiorly in the left pulmonary artery, appearing to occlude the apicoposterior segmental pulmonary artery branch. Although the peripheral location of the clot in the pulmonary artery would tend to argue for chronic embolus, the occlusion of the apicoposterior segmental branches, the small left pleural effusion, the patient's left  chest pain, and the airspace opacity posteriorly in the left upper lobe argue for acute pulmonary embolus with possible pulmonary hemorrhage or pulmonary infarct. Positive for acute PE with CT evidence of right heart strain (RV/LV Ratio = 1.6)  Interim History / Subjective:  Still having some pleuritic type left chest pain Objective   Blood pressure 121/69, pulse 64, temperature 98.3 F (36.8 C), temperature source Oral,  resp. rate (Abnormal) 22, height 5' (1.524 m), weight 83 kg, SpO2 97 %.       No intake or output data in the 24 hours ending 10/08/21 1609 Filed Weights   10/08/21 1212  Weight: 83 kg    Examination: General: 88 year old female resting in bed no acute distress HENT: Normocephalic atraumatic no jugular venous distention  Lungs: Clear decreased bases currently on room air  Cardiovascular: Regular rate and rhythm mildly tachycardic heart rate 104 Abdomen: Soft not tender Extremities: Trace lower extremity edema Neuro: Very hard of hearing alert oriented GU: [facility] Problem list     Assessment & Plan:  Principal Problem:   Recurrent pulmonary emboli ([place]) Active Problems:   Chest pain, pleuritic   Hypothyroidism   Hyperlipidemia with target low density lipoprotein (LDL) cholesterol less than 100 mg/dL   HYPERTENSION, MILD   GERD (gastroesophageal reflux disease)   Kidney disease, chronic, stage IV (GFR 15-29 ml/min) (HCC)   Acute submassive Pulmonary emboli (recurrent & non-provoked) -sPESI score: 2, trop I neg.  Plan Admit to stepdown unit IV heparin Serial troponins Echo and lower extremity ultrasound She will need to be on lifelong anticoagulation At this point it does not appear as though she needs catheter directed thrombectomy or tPA  Best Practice (right click and "Reselect all SmartList Selections" daily)   Per primary  Labs   CBC: Recent Labs  Lab 10/08/21 1253  WBC 10.7*  HGB 13.6  HCT 41.3  MCV 96.9  PLT 500    Basic Metabolic Panel: Recent Labs  Lab 10/08/21 1253  NA 139  K 4.2  CL 103  CO2 28  GLUCOSE 109*  BUN 23  CREATININE 1.73*  CALCIUM 9.2   GFR: Estimated Creatinine Clearance: 20.6 mL/min (A) (by  C-G formula based on SCr of 1.73 mg/dL (H)). Recent Labs  Lab 10/08/21 1253  WBC 10.7*    Liver Function Tests: No results for input(s): "AST", "ALT", "ALKPHOS", "BILITOT", "PROT", "ALBUMIN" in the last 168  hours. No results for input(s): "LIPASE", "AMYLASE" in the last 168 hours. No results for input(s): "AMMONIA" in the last 168 hours.  ABG No results found for: "PHART", "PCO2ART", "PO2ART", "HCO3", "TCO2", "ACIDBASEDEF", "O2SAT"   Coagulation Profile: No results for input(s): "INR", "PROTIME" in the last 168 hours.  Cardiac Enzymes: No results for input(s): "CKTOTAL", "CKMB", "CKMBINDEX", "TROPONINI" in the last 168 hours.  HbA1C: Hgb A1c MFr Bld  Date/Time Value Ref Range Status  06/12/2013 02:09 PM 6.2 4.6 - 6.5 % Final    Comment:    Glycemic Control Guidelines for People with Diabetes:Non Diabetic:  <6%Goal of Therapy: <7%Additional Action Suggested:  >8%   11/19/2012 02:02 PM 6.2 4.6 - 6.5 % Final    Comment:    Glycemic Control Guidelines for People with Diabetes:Non Diabetic:  <6%Goal of Therapy: <7%Additional Action Suggested:  >8%     CBG: No results for input(s): "GLUCAP" in the last 168 hours.  Review of Systems:   Review of Systems  Constitutional:  Negative for fever, malaise/fatigue and weight loss.  HENT: Negative.    Eyes: Negative.   Respiratory: Negative.    Cardiovascular:  Positive for chest pain.  Gastrointestinal: Negative.   Genitourinary: Negative.   Musculoskeletal: Negative.   Skin: Negative.   Neurological: Negative.   Endo/Heme/Allergies: Negative.   Psychiatric/Behavioral: Negative.       Past Medical History:  She,  has a past medical history of Anemia, unspecified, Breast cyst, Chest pain, unspecified, Depression, Diarrhea of presumed infectious origin, Diffuse cystic mastopathy, Diverticulosis (06-2009), DJD (degenerative joint disease), Dysmetabolic syndrome X, Edema, Esophageal reflux, Esophageal stricture, Gout, unspecified, Hiatal hernia, History of gallstones, History of Helicobacter pylori infection, HLD (hyperlipidemia), HTN (hypertension), Hypertrophied anal papilla, Hypopotassemia, Hypothyroidism, Iron deficiency anemia,  unspecified, Lung nodule, Osteoarthritis, Pneumonia (1993), Solitary cyst of breast, Type II or unspecified type diabetes mellitus without mention of complication, not stated as uncontrolled, Unspecified sinusitis (chronic), and Vitamin B12 deficiency.   Surgical History:   Past Surgical History:  Procedure Laterality Date   BLADDER SUSPENSION     BREAST CYST EXCISION     left   CATARACT EXTRACTION, BILATERAL  2011   CHOLECYSTECTOMY  03/28/2011   Procedure: LAPAROSCOPIC CHOLECYSTECTOMY WITH INTRAOPERATIVE CHOLANGIOGRAM;  Surgeon: Imogene Burn. Georgette Dover, MD;  Location: [facility];  Service: General;  Laterality: N/A;   ERCP  10/06/2011   Procedure: ENDOSCOPIC RETROGRADE CHOLANGIOPANCREATOGRAPHY (ERCP);  Surgeon: Inda Castle, MD;  Location: [facility];  Service: Endoscopy;  Laterality: N/A;   PARTIAL HYSTERECTOMY     REPLACEMENT TOTAL KNEE     bilateral   SHOULDER SURGERY     bilateral   TONSILLECTOMY AND ADENOIDECTOMY     TUBAL LIGATION       Social History:   reports that she quit smoking about 33 years ago. Her smoking use included cigarettes. She has a 1.25 pack-year smoking history. Her smokeless tobacco use includes snuff. She reports that she does not drink alcohol and does not use drugs.   Family History:  Her family history includes Breast cancer in her maternal aunt; Cancer in her brother; Colon cancer in her brother; Heart disease in her father; Stomach cancer in her brother; Stroke in her mother.   Allergies Allergies  Allergen Reactions   Cephalexin  REACTION: questionable   Codeine Nausea And Vomiting   Gabapentin     Headache   Sulfamethoxazole-Trimethoprim     REACTION: unspecified   Sulfonamide Derivatives    Vicodin [Hydrocodone-Acetaminophen] Other (See Comments)    MAKES HER CRAZY      Home Medications  Prior to Admission medications   Medication Sig Start Date End Date Taking? Authorizing Provider  amLODipine (NORVASC) 5 MG tablet Take 5 mg by mouth daily.  10/16/17   [provider]  APIXABAN Arne Cleveland) VTE STARTER PACK (10MG  AND 5MG ) Take as directed on package: start with two-5mg  tablets twice daily for 7 days. On day 8, switch to one-5mg  tablet twice daily. 05/04/20   Drenda Freeze, MD  dorzolamide-timolol (COSOPT) 22.3-6.8 MG/ML ophthalmic solution Place 1 drop into both eyes 2 (two) times daily.  11/18/10   [provider]  furosemide (LASIX) 40 MG tablet Take 40 mg by mouth daily. 10/16/17   [provider]  hydrochlorothiazide (HYDRODIURIL) 12.5 MG tablet Take 12.5 mg by mouth every morning. 05/08/19   [provider]  latanoprost (XALATAN) 0.005 % ophthalmic solution SMARTSIG:1 Drop(s) In Eye(s) Every Evening 05/30/19   [provider]  levothyroxine (SYNTHROID, LEVOTHROID) 50 MCG tablet TAKE 1 TABLET EVERY DAY 11/27/13   Janith Lima, MD  losartan (COZAAR) 50 MG tablet Take 50 mg by mouth daily. 06/27/19   [provider]  pantoprazole (PROTONIX) 40 MG tablet TAKE ONE TABLET BY MOUTH ONCE DAILY BEFORE  BREAKFAST 09/10/15   Milus Banister, MD  Probiotic Product (PROBIOTIC-10 PO) Take by mouth.    [provider]  timolol (TIMOPTIC) 0.5 % ophthalmic solution 1 drop 2 (two) times daily. 05/30/19   [provider]  Travoprost, BAK Free, (TRAVATAN) 0.004 % SOLN ophthalmic solution Place 1 drop into both eyes at bedtime.    [provider]     Critical care time: NA   Erick Colace ACNP-BC [place] Pager # [phone] OR # [phone] if no answer

## 2021-10-08 NOTE — ED Notes (Signed)
Pt back from CT at this time 

## 2021-10-08 NOTE — ED Notes (Signed)
Floor ready to receive pt

## 2021-10-08 NOTE — ED Provider Notes (Signed)
Patient's CT confirms PE. I have personally viewed/interpreted these images. I also spoke to radiology about the images.  IV heparin order placed.  She is currently pain-free though later did develop a little bit of pain.  I did consult intensivist given her significant RV-LV ratio.  Otherwise hemodynamically stable.  Discussed with Dr. Roosevelt Locks for admission.  CRITICAL CARE Performed by: Ephraim Hamburger   Total critical care time: 30 minutes  Critical care time was exclusive of separately billable procedures and treating other patients.  Critical care was necessary to treat or prevent imminent or life-threatening deterioration.  Critical care was time spent personally by me on the following activities: development of treatment plan with patient and/or surrogate as well as nursing, discussions with consultants, evaluation of patient's response to treatment, examination of patient, obtaining history from patient or surrogate, ordering and performing treatments and interventions, ordering and review of laboratory studies, ordering and review of radiographic studies, pulse oximetry and re-evaluation of patient's condition.    Sherwood Gambler, MD 10/08/21 [phone]

## 2021-10-09 ENCOUNTER — Encounter (HOSPITAL_COMMUNITY): Payer: Self-pay | Admitting: Internal Medicine

## 2021-10-09 ENCOUNTER — Observation Stay (HOSPITAL_BASED_OUTPATIENT_CLINIC_OR_DEPARTMENT_OTHER): Payer: Medicare Other

## 2021-10-09 ENCOUNTER — Telehealth: Payer: Self-pay | Admitting: Pulmonary Disease

## 2021-10-09 DIAGNOSIS — Z86718 Personal history of other venous thrombosis and embolism: Secondary | ICD-10-CM | POA: Diagnosis not present

## 2021-10-09 DIAGNOSIS — R609 Edema, unspecified: Secondary | ICD-10-CM

## 2021-10-09 DIAGNOSIS — I2699 Other pulmonary embolism without acute cor pulmonale: Secondary | ICD-10-CM | POA: Diagnosis not present

## 2021-10-09 LAB — CBC
HCT: 32.9 % — ABNORMAL LOW (ref 36.0–46.0)
Hemoglobin: 11 g/dL — ABNORMAL LOW (ref 12.0–15.0)
MCH: 31.6 pg (ref 26.0–34.0)
MCHC: 33.4 g/dL (ref 30.0–36.0)
MCV: 94.5 fL (ref 80.0–100.0)
Platelets: 205 10*3/uL (ref 150–400)
RBC: 3.48 MIL/uL — ABNORMAL LOW (ref 3.87–5.11)
RDW: 12.8 % (ref 11.5–15.5)
WBC: 9.3 10*3/uL (ref 4.0–10.5)
nRBC: 0 % (ref 0.0–0.2)

## 2021-10-09 LAB — BASIC METABOLIC PANEL
Anion gap: 8 (ref 5–15)
BUN: 19 mg/dL (ref 8–23)
CO2: 24 mmol/L (ref 22–32)
Calcium: 8.1 mg/dL — ABNORMAL LOW (ref 8.9–10.3)
Chloride: 105 mmol/L (ref 98–111)
Creatinine, Ser: 1.56 mg/dL — ABNORMAL HIGH (ref 0.44–1.00)
GFR, Estimated: 31 mL/min — ABNORMAL LOW (ref >60)
Glucose, Bld: 106 mg/dL — ABNORMAL HIGH (ref 70–99)
Potassium: 3.8 mmol/L (ref 3.5–5.1)
Sodium: 137 mmol/L (ref 135–145)

## 2021-10-09 LAB — HEPARIN LEVEL (UNFRACTIONATED)
Heparin Unfractionated: 0.66 IU/mL (ref 0.30–0.70)
Heparin Unfractionated: 0.69 IU/mL (ref 0.30–0.70)

## 2021-10-09 MED ORDER — APIXABAN 5 MG PO TABS
10.0000 mg | ORAL_TABLET | Freq: Two times a day (BID) | ORAL | Status: DC
  Administered 2021-10-09 – 2021-10-10 (×3): 10 mg via ORAL
  Filled 2021-10-09 (×3): qty 2

## 2021-10-09 MED ORDER — APIXABAN 5 MG PO TABS: 5.0000 mg | ORAL_TABLET | Freq: Two times a day (BID) | ORAL | Status: DC

## 2021-10-09 MED ORDER — APIXABAN (ELIQUIS) EDUCATION KIT FOR DVT/PE PATIENTS
PACK | Freq: Once | Status: AC
Start: 2021-10-09 — End: 2021-10-09
  Filled 2021-10-09: qty 1

## 2021-10-09 MED ORDER — AMLODIPINE BESYLATE 5 MG PO TABS
5.0000 mg | ORAL_TABLET | Freq: Every day | ORAL | Status: DC
  Administered 2021-10-09 – 2021-10-10 (×2): 5 mg via ORAL
  Filled 2021-10-09 (×2): qty 1

## 2021-10-09 MED ORDER — HYDRALAZINE HCL 25 MG PO TABS
25.0000 mg | ORAL_TABLET | Freq: Three times a day (TID) | ORAL | Status: DC
  Administered 2021-10-09 – 2021-10-10 (×2): 25 mg via ORAL
  Filled 2021-10-09 (×2): qty 1

## 2021-10-09 NOTE — Plan of Care (Signed)

## 2021-10-09 NOTE — Telephone Encounter (Signed)
Please schedule follow up with me in 4-6 weeks for pulmonary embolism.  Thanks, JD

## 2021-10-09 NOTE — Care Management Obs Status (Signed)
[facility] NOTIFICATION   Patient Details  Name: Yesenia [name] MRN: [id number] Date of Birth: 09-May-1929   Medicare Observation Status Notification Given:  Yes    Bartholomew Crews, RN 10/09/2021, 5:11 PM

## 2021-10-09 NOTE — Progress Notes (Signed)
NAME:  Yesenia [name], MRN:  [id number], DOB:  [birth year], LOS: 0 ADMISSION DATE:  10/08/2021, CONSULTATION DATE:  7/28 REFERRING MD:  Regenia Skeeter, CHIEF COMPLAINT:  PE   History of Present Illness:  88 year old female with history as mentioned below presented to the emergency room at 7/28 brought in by EMS with chief complaint of pleuritic type left chest pain.  Initial pulse oximeter 96% on room air heart rate 74, initial troponins negative, CT angiogram obtained by emergency room physician showing low-density filling defect in the left pulmonary artery occluding the apical posterior segmental pulmonary artery branch RV/LV ratio 1.60 pulmonary asked to evaluate given submassive classification  Pertinent  Medical History  CKD stage IV, prior pulmonary embolus, hypercholesterolemia, hypothyroidism, osteoarthritis of knee, GERD, hypertension, venous insufficiency, mitral stenosis anemia, gout  Significant Hospital Events: Including procedures, antibiotic start and stop dates in addition to other pertinent events   7/28 admitted with submassive pulmonary emboli.  Initial RV/LV ratio 1.6.1. Peripheral low-density filling defect compatible with thrombus superiorly in the left pulmonary artery, appearing to occlude the apicoposterior segmental pulmonary artery branch. Although the peripheral location of the clot in the pulmonary artery would tend to argue for chronic embolus, the occlusion of the apicoposterior segmental branches, the small left pleural effusion, the patient's left  chest pain, and the airspace opacity posteriorly in the left upper lobe argue for acute pulmonary embolus with possible pulmonary hemorrhage or pulmonary infarct. Positive for acute PE with CT evidence of right heart strain (RV/LV Ratio = 1.6)  Interim History / Subjective:   No acute events overnight Remains on room air No complaints at this time. Granddaughter at bedside  Objective   Blood pressure (!) 151/67, pulse 74,  temperature 98 F (36.7 C), temperature source Oral, resp. rate 16, height 5' (1.524 m), weight 83 kg, SpO2 94 %.        Intake/Output Summary (Last 24 hours) at 10/09/2021 1144 Last data filed at 10/09/2021 1000 Gross per 24 hour  Intake 60 ml  Output 600 ml  Net -540 ml   Filed Weights   10/08/21 1212  Weight: 83 kg   Examination: General: elderly woman, no acute distress, resting in bed HEENT: [place]/AT, moist mucous membranes, sclera anicteric Neuro: hard of hearing, A&O x 3, moving all extremities CV: tachycardic, s1s2, no murmurs PULM: clear to auscultation bilaterally. No wheezing GI: soft, non-tender, non-distended, BS+ Extremities: warm, trace edema Skin: no rashes   Resolved Hospital Problem list     Assessment & Plan:  Principal Problem:   Recurrent pulmonary emboli (HCC) Active Problems:   Hypothyroidism   Hyperlipidemia with target low density lipoprotein (LDL) cholesterol less than 100 mg/dL   HYPERTENSION, MILD   GERD (gastroesophageal reflux disease)   Kidney disease, chronic, stage IV (GFR 15-29 ml/min) (HCC)   Chest pain, pleuritic   Pulmonary emboli (HCC)   Acute submassive Pulmonary emboli (recurrent & non-provoked) -sPESI score: 2, trop I neg.  Plan - Can transition to eliquis therapy - Will plan to treat for at least 3 months and then determine risk/benefit of on going anticoagulation. May benefit from IVC filter placement once done with course of anticoagulation as she is technically a candidate for life long anticoagulation - Echo without right heart strain - Awaiting lower extremity US to evaluate for DVT  Patient is safe for discharge on eliquis once lower ext [place] completed. PCCM will sign off. Will arrange follow up in our clinic.   Best Practice (right click and "  Reselect all SmartList Selections" daily)   Per primary  Labs   CBC: Recent Labs  Lab 10/08/21 1253 10/09/21 0130  WBC 10.7* 9.3  HGB 13.6 11.0*  HCT 41.3 32.9*  MCV 96.9  94.5  PLT 245 205     Basic Metabolic Panel: Recent Labs  Lab 10/08/21 1253 10/09/21 0130  NA 139 137  K 4.2 3.8  CL 103 105  CO2 28 24  GLUCOSE 109* 106*  BUN 23 19  CREATININE 1.73* 1.56*  CALCIUM 9.2 8.1*    GFR: Estimated Creatinine Clearance: 22.9 mL/min (A) (by C-G formula based on SCr of 1.56 mg/dL (H)). Recent Labs  Lab 10/08/21 1253 10/08/21 1713 10/08/21 1715 10/09/21 0130  PROCALCITON  --  <0.10  --   --   WBC 10.7*  --   --  9.3  LATICACIDVEN  --   --  1.5  --      Liver Function Tests: No results for input(s): "AST", "ALT", "ALKPHOS", "BILITOT", "PROT", "ALBUMIN" in the last 168 hours. No results for input(s): "LIPASE", "AMYLASE" in the last 168 hours. No results for input(s): "AMMONIA" in the last 168 hours.  ABG No results found for: "PHART", "PCO2ART", "PO2ART", "HCO3", "TCO2", "ACIDBASEDEF", "O2SAT"   Coagulation Profile: Recent Labs  Lab 10/08/21 1713  INR 1.1    Cardiac Enzymes: No results for input(s): "CKTOTAL", "CKMB", "CKMBINDEX", "TROPONINI" in the last 168 hours.  HbA1C: Hgb A1c MFr Bld  Date/Time Value Ref Range Status  06/12/2013 02:09 PM 6.2 4.6 - 6.5 % Final    Comment:    Glycemic Control Guidelines for People with Diabetes:Non Diabetic:  <6%Goal of Therapy: <7%Additional Action Suggested:  >8%   11/19/2012 02:02 PM 6.2 4.6 - 6.5 % Final    Comment:    Glycemic Control Guidelines for People with Diabetes:Non Diabetic:  <6%Goal of Therapy: <7%Additional Action Suggested:  >8%     CBG: No results for input(s): "GLUCAP" in the last 168 hours.    Critical care time: NA   Freda Jackson, MD [facility] Office: [phone]   See Amion for personal pager PCCM on call pager [phone] until 7pm. Please call Elink 7p-7a. [phone]

## 2021-10-09 NOTE — Discharge Instructions (Signed)
Information on my medicine - ELIQUIS (apixaban)  This medication education was reviewed with me or my healthcare representative as part of my discharge preparation.    Why was Eliquis prescribed for you? Eliquis was prescribed to treat blood clots that may have been found in the veins of your legs (deep vein thrombosis) or in your lungs (pulmonary embolism) and to reduce the risk of them occurring again.  What do You need to know about Eliquis ? The starting dose is 10 mg (two 5 mg tablets) taken TWICE daily for the FIRST SEVEN (7) DAYS, then on (enter date)  10/16/21  the dose is reduced to ONE 5 mg tablet taken TWICE daily.  Eliquis may be taken with or without food.   Try to take the dose about the same time in the morning and in the evening. If you have difficulty swallowing the tablet whole please discuss with your pharmacist how to take the medication safely.  Take Eliquis exactly as prescribed and DO NOT stop taking Eliquis without talking to the doctor who prescribed the medication.  Stopping may increase your risk of developing a new blood clot.  Refill your prescription before you run out.  After discharge, you should have regular check-up appointments with your healthcare provider that is prescribing your Eliquis.    What do you do if you miss a dose? If a dose of ELIQUIS is not taken at the scheduled time, take it as soon as possible on the same day and twice-daily administration should be resumed. The dose should not be doubled to make up for a missed dose.  Important Safety Information A possible side effect of Eliquis is bleeding. You should call your healthcare provider right away if you experience any of the following: Bleeding from an injury or your nose that does not stop. Unusual colored urine (red or dark brown) or unusual colored stools (red or black). Unusual bruising for unknown reasons. A serious fall or if you hit your head (even if there is no  bleeding).  Some medicines may interact with Eliquis and might increase your risk of bleeding or clotting while on Eliquis. To help avoid this, consult your healthcare provider or pharmacist prior to using any new prescription or non-prescription medications, including herbals, vitamins, non-steroidal anti-inflammatory drugs (NSAIDs) and supplements.  This website has more information on Eliquis (apixaban): http://www.eliquis.com/eliquis/home

## 2021-10-09 NOTE — TOC Progression Note (Signed)
Transition of Care [facility]) - Progression Note    Patient Details  Name: Yesenia [name] MRN: [id number] Date of Birth: 01-Feb-1930  Transition of Care [facility]) CM/SW Contact  Bartholomew Crews, RN Phone Number: [phone] 10/09/2021, 8:08 AM  Clinical Narrative:     Acknowledging [facility] consult for benefits check for apixaban vs rivaroxaban - unable to complete benefit checks on the weekend. TOC to follow up on Monday if patient remains at hospital.        Expected Discharge Plan and Services                                                 Social Determinants of Health (SDOH) Interventions    Readmission Risk Interventions     No data to display

## 2021-10-09 NOTE — Progress Notes (Signed)
  Progress Note Patient: Yesenia [name] LSL:[id number] DOB: [birth year] DOA: 10/08/2021  DOS: the patient was seen and examined on 10/09/2021  Brief hospital course: BRINLYN CENA is a 88 y.o. female with medical history significant of DVT off Eliquis, HTN, CKD stage IIIb, GERD, HLD, hypothyroidism, presented with worsening of chest pain.  Found to have unprovoked acute PE as well as extensive bilateral lower extremity DVT.  For now we will observe overnight to ensure stability of the clots on Eliquis. Assessment and Plan: Recurrent pulmonary embolism causing chest pain. Acute bilateral lower extremity DVT. Patient's presents with complaints of chest pain currently resolved.  Has some mild shortness of breath.  Ambulation oxygenation remained stable above 90%.  No dizziness or lightheadedness.  No symptoms of orthostasis.  Currently not on any oxygen. Has prior history of DVT on the left leg. Ultrasound Doppler shows presence of DVT all the way and common femoral vein on the right.  On the left it only shows an common femoral vein but nowhere else. Patient was on IV heparin.  We will transition to oral Eliquis.  Monitor overnight. Echocardiogram actually showed RV strain.  CKD 3B. Patient actually is on ACE inhibitor and diuretic. Patient also received IV contrast. For now we will monitor renal function. Patient was given IV fluid currently I will stop it.  Hypertensive urgency. Blood pressure elevated.  No symptoms. Continuing amlodipine.  Holding HCTZ and ACE inhibitor due to CKD.  Hypothyroidism. We will continue Synthroid.  Obesity. Body mass index is 35.74 kg/m.  Placing the pt at higher risk of poor outcomes.  Subjective: Denies any chest pain.  Has some shortness of breath.  No nausea or vomiting.  No cough.  No bleeding anywhere.  Family and the patient denies any recent immobilization.  Physical Exam: Vitals:   10/09/21 0006 10/09/21 0431 10/09/21 0817 10/09/21 1428  BP:  (!) 170/71 (!) 155/57 (!) 151/67 (!) 160/73  Pulse: 80 75 74 80  Resp: 20 16  18   Temp: 98.2 F (36.8 C) 98.4 F (36.9 C) 98 F (36.7 C) 98 F (36.7 C)  TempSrc: Oral Oral Oral   SpO2: 95% 97% 94% 95%  Weight:      Height:       General: Appear in mild distress; no visible Abnormal Neck Mass Or lumps, Conjunctiva normal Cardiovascular: S1 and S2 Present, aortic systolic  Murmur, Respiratory: good respiratory effort, Bilateral Air entry present and faint Crackles, no wheezes Abdomen: Bowel Sound present, Non tender  Extremities: bilateral  Pedal edema Neurology: alert and oriented to time, place, and person  Gait not checked due to patient safety concerns   Data Reviewed: I have Reviewed nursing notes, Vitals, and Lab results since pt's last encounter. Pertinent lab results CBC and BMP I have ordered test including CBC and BMP    Family Communication: Discussed with granddaughter who was at bedside and also on the phone  Disposition: Status is: Observation  Author: Berle Mull, MD 10/09/2021 7:15 PM  Please look on [email or web address] to find out who is on call.

## 2021-10-09 NOTE — Hospital Course (Signed)
Yesenia [name] is a 88 y.o. female with medical history significant of DVT off Eliquis, HTN, CKD stage IIIb, GERD, HLD, hypothyroidism, presented with worsening of chest pain.  Found to have unprovoked acute PE as well as extensive bilateral lower extremity DVT.  For now we will observe overnight to ensure stability of the clots on Eliquis.

## 2021-10-09 NOTE — Evaluation (Signed)
Physical Therapy Evaluation Patient Details Name: Yesenia [name] MRN: [id number] DOB: [birth year] Today's Date: 10/09/2021  History of Present Illness  Pt is 88yo F admitted on 7/28 with complaints of chest pain. CT revealed left PE. PMH: HTN, HLD, CKD, Venous insufficiency, GERD, DVT  Clinical Impression  PTA, pt was independent with mobility and using RW. Pt required minimal assist with bathing and her daughter does the housework and cares for pts husband who has dementia. Pt currently requiring min guard for transfers and ambulation of 167ft, limited by complaints of SOB. O2 92% on RA at rest, 80% during ambulation with poor pleth. Pt educated on importance of frequent mobilization to prevent further clots. Pt would benefit from continued acute PT to address deficits in tolerance to functional mobility. Recommending HHPT upon discharge.      Recommendations for follow up therapy are one component of a multi-disciplinary discharge planning process, led by the attending physician.  Recommendations may be updated based on patient status, additional functional criteria and insurance authorization.  Follow Up Recommendations Home health PT      Assistance Recommended at Discharge PRN  Patient can return home with the following  A little help with bathing/dressing/bathroom;Assistance with cooking/housework;Assist for transportation    Equipment Recommendations None recommended by PT (Pt equipped)  Recommendations for Other Services       Functional Status Assessment Patient has had a recent decline in their functional status and demonstrates the ability to make significant improvements in function in a reasonable and predictable amount of time.     Precautions / Restrictions Precautions Precautions: Fall;Other (comment) Precaution Comments: watch O2 Restrictions Weight Bearing Restrictions: No      Mobility  Bed Mobility Overal bed mobility: Needs Assistance Bed Mobility: Supine  to Sit, Sit to Supine     Supine to sit: Min guard Sit to supine: Min assist   General bed mobility comments: Cueing for use of hand rail to get to EOB, minA to initiate LE return to supine    Transfers Overall transfer level: Needs assistance Equipment used: Rolling walker (2 wheels) Transfers: Sit to/from Stand Sit to Stand: Min guard           General transfer comment: min guard for safety, increased time and effort to stand    Ambulation/Gait Ambulation/Gait assistance: Min guard Gait Distance (Feet): 100 Feet Assistive device: Rolling walker (2 wheels) Gait Pattern/deviations: Step-through pattern, Decreased stride length, Trunk flexed Gait velocity: decreased Gait velocity interpretation: 1.31 - 2.62 ft/sec, indicative of limited community ambulator   General Gait Details: cues for upright posture, complaints of SOB and request to return to room  Stairs            Wheelchair Mobility    Modified Rankin (Stroke Patients Only)       Balance Overall balance assessment: Needs assistance Sitting-balance support: No upper extremity supported, Feet supported Sitting balance-Leahy Scale: Good     Standing balance support: Bilateral upper extremity supported, During functional activity Standing balance-Leahy Scale: Poor Standing balance comment: reliant on RW                             Pertinent Vitals/Pain Pain Assessment Pain Assessment: No/denies pain    Home Living Family/patient expects to be discharged to:: Private residence Living Arrangements: Spouse/significant other;Children   Type of Home: House Home Access: Level entry       Home Layout: One level Home Equipment: Conservation officer, nature (  2 wheels) Additional Comments: husband has dementia, on hospice    Prior Function Prior Level of Function : Needs assist             Mobility Comments: uses RW ADLs Comments: Daughter assists with bathinga and does housework/cooking      Hand Dominance        Extremity/Trunk Assessment   Upper Extremity Assessment Upper Extremity Assessment: Overall WFL for tasks assessed    Lower Extremity Assessment Lower Extremity Assessment: Overall WFL for tasks assessed    Cervical / Trunk Assessment Cervical / Trunk Assessment: Kyphotic  Communication   Communication: HOH  Cognition Arousal/Alertness: Awake/alert Behavior During Therapy: WFL for tasks assessed/performed Overall Cognitive Status: Within Functional Limits for tasks assessed                                          General Comments      Exercises     Assessment/Plan    PT Assessment Patient needs continued PT services  PT Problem List Decreased activity tolerance;Decreased balance;Decreased mobility;Cardiopulmonary status limiting activity       PT Treatment Interventions Gait training;Functional mobility training;Therapeutic activities;Therapeutic exercise;Balance training;Patient/family education    PT Goals (Current goals can be found in the Care Plan section)  Acute Rehab PT Goals Patient Stated Goal: to return home PT Goal Formulation: With patient Time For Goal Achievement: 10/23/21 Potential to Achieve Goals: Good    Frequency Min 3X/week     Co-evaluation               AM-PAC PT "6 Clicks" Mobility  Outcome Measure Help needed turning from your back to your side while in a flat bed without using bedrails?: None Help needed moving from lying on your back to sitting on the side of a flat bed without using bedrails?: A Little Help needed moving to and from a bed to a chair (including a wheelchair)?: A Little Help needed standing up from a chair using your arms (e.g., wheelchair or bedside chair)?: A Little Help needed to walk in hospital room?: A Little Help needed climbing 3-5 steps with a railing? : A Lot 6 Click Score: 18    End of Session Equipment Utilized During Treatment: Gait belt Activity  Tolerance: Patient tolerated treatment well;Treatment limited secondary to medical complications (Comment) (complaints of SOB) Patient left: in bed;with call bell/phone within reach;with bed alarm set Nurse Communication: Mobility status PT Visit Diagnosis: Unsteadiness on feet (R26.81);Other abnormalities of gait and mobility (R26.89)    Time: 7793-9030 PT Time Calculation (min) (ACUTE ONLY): 23 min   Charges:   PT Evaluation $PT Eval Moderate Complexity: 1 Mod PT Treatments $Therapeutic Activity: 8-22 mins      Mackie Pai, SPT Acute Rehabilitation Services  Office: [phone]   Mackie Pai 10/09/2021, 3:35 PM

## 2021-10-09 NOTE — TOC Initial Note (Signed)
Transition of Care [facility]) - Initial/Assessment Note    Patient Details  Name: Yesenia [name] MRN: [id number] Date of Birth: 1929/04/11  [facility]) CM/SW Contact:    Bartholomew Crews, RN Phone Number: [phone] 10/09/2021, 5:27 PM  Clinical Narrative:                  Spoke with patient at the bedside to discuss post acute transition. PTA patient living with her spouse who has dementia and under hospice care, and her daughter, Freda Munro, who is her caregiver/POA. Patient also consents to discussing her care with her granddaughter, Colletta [place].   Discussed recommendations for [facility] PT. Patient is not sure that this is something that she wants or needs. Discussed that PCP could also provide referral for f/u after transition home.   Expected Discharge Plan: [place] Barriers to Discharge: Continued Medical Work up   Patient Goals and CMS Choice Patient states their goals for this hospitalization and ongoing recovery are:: return home with daughter and spouse CMS Medicare.gov Compare Post Acute Care list provided to:: Patient Choice offered to / list presented to : Patient  Expected Discharge Plan and Services Expected Discharge Plan: [place]   Discharge Planning Services: CM Consult Post Acute Care Choice: [place] arrangements for the past 2 months: Single Family Home                 DME Arranged: N/A DME Agency: NA                  Prior Living Arrangements/Services Living arrangements for the past 2 months: Single Family Home Lives with:: Self, Adult Children, Spouse Patient language and need for interpreter reviewed:: Yes Do you feel safe going back to the place where you live?: Yes      Need for Family Participation in Patient Care: Yes (Comment) Care giver support system in place?: Yes (comment)   Criminal Activity/Legal Involvement Pertinent to Current Situation/Hospitalization: No - Comment as  needed  Activities of Daily Living Home Assistive Devices/Equipment: Walker (specify type) ADL Screening (condition at time of admission) Patient's cognitive ability adequate to safely complete daily activities?: No Is the patient deaf or have difficulty hearing?: Yes Does the patient have difficulty seeing, even when wearing glasses/contacts?: No Does the patient have difficulty concentrating, remembering, or making decisions?: No Patient able to express need for assistance with ADLs?: Yes Does the patient have difficulty dressing or bathing?: No Independently performs ADLs?: Yes (appropriate for developmental age) Does the patient have difficulty walking or climbing stairs?: No Weakness of Legs: Both Weakness of Arms/Hands: None  Permission Sought/Granted Permission sought to share information with : Family Supports          Permission granted to share info w Relationship: daughter, Freda Munro, and granddaughter, Colletta [place]     Emotional Assessment Appearance:: Appears stated age Attitude/Demeanor/Rapport: Engaged Affect (typically observed): Accepting Orientation: : Oriented to Self, Oriented to Place, Oriented to  Time, Oriented to Situation Alcohol / Substance Use: Not Applicable Psych Involvement: No (comment)  Admission diagnosis:  Pulmonary emboli [facility]) [I26.99] Patient Active Problem List   Diagnosis Date Noted   Recurrent pulmonary emboli ([place]) 10/08/2021   Chest pain, pleuritic 10/08/2021   Pulmonary emboli ([place]) 10/08/2021   Lumbar radiculopathy 07/24/2013   Leukocytosis, unspecified 07/22/2013   Nocturnal leg cramps 07/18/2013   Tinea corporis 07/18/2013   Greater trochanteric bursitis of left hip 06/26/2013   IBS (irritable bowel  syndrome) 03/08/2013   Kidney disease, chronic, stage IV (GFR 15-29 ml/min) ([place]) 11/19/2012   GERD (gastroesophageal reflux disease) 07/08/2010   Obesity 07/08/2010   GOUT 12/14/2009   Hyperlipidemia with target low density  lipoprotein (LDL) cholesterol less than 100 mg/dL 11/21/2008   VITAMIN B12 DEFICIENCY 09/07/2007   Hypothyroidism 07/07/2007   HYPERTENSION, MILD 07/07/2007   DEGENERATIVE JOINT DISEASE 07/07/2007   PCP:  Lawerance Cruel, MD Pharmacy:   [place], [place] - [address] [place] [address] Phone: [phone] Fax: [phone]     Social Determinants of Health (SDOH) Interventions    Readmission Risk Interventions     No data to display

## 2021-10-09 NOTE — Progress Notes (Signed)
ANTICOAGULATION CONSULT NOTE   Pharmacy Consult for Heparin Indication: pulmonary embolus  Allergies  Allergen Reactions   Cephalexin     REACTION: questionable   Codeine Nausea And Vomiting   Gabapentin     Headache   Sulfamethoxazole-Trimethoprim     REACTION: unspecified   Sulfonamide Derivatives    Vicodin [Hydrocodone-Acetaminophen] Other (See Comments)    MAKES HER CRAZY     Patient Measurements: Height: 5' (152.4 cm) Weight: 83 kg (183 lb) IBW/kg (Calculated) : 45.5 Heparin Dosing Weight: 64.7 kg  Vital Signs: Temp: 98.2 F (36.8 C) (07/29 0006) Temp Source: Oral (07/29 0006) BP: 170/71 (07/29 0006) Pulse Rate: 80 (07/29 0006)  Labs: Recent Labs    10/08/21 1253 10/08/21 1713 10/09/21 0130  HGB 13.6  --  11.0*  HCT 41.3  --  32.9*  PLT 245  --  205  APTT  --  21*  --   LABPROT  --  13.7  --   INR  --  1.1  --   HEPARINUNFRC  --   --  0.66  CREATININE 1.73*  --  1.56*  TROPONINIHS 12 14  --      Estimated Creatinine Clearance: 22.9 mL/min (A) (by C-G formula based on SCr of 1.56 mg/dL (H)).   Medical History: Past Medical History:  Diagnosis Date   Anemia, unspecified    Breast cyst    Chest pain, unspecified    Depression    Diarrhea of presumed infectious origin    Diffuse cystic mastopathy    Diverticulosis 06-2009   mild- Colonoscopy    DJD (degenerative joint disease)    Dysmetabolic syndrome X    Edema    Esophageal reflux    Esophageal stricture    Gout, unspecified    Hiatal hernia    History of gallstones    History of Helicobacter pylori infection    HLD (hyperlipidemia)    HTN (hypertension)    Hypertrophied anal papilla    Hypopotassemia    Hypothyroidism    Iron deficiency anemia, unspecified    Lung nodule    Osteoarthritis    Pneumonia 1993   Solitary cyst of breast    Type II or unspecified type diabetes mellitus without mention of complication, not stated as uncontrolled    pt. states not a diabetic    Unspecified sinusitis (chronic)    Vitamin B12 deficiency     Medications:  Medications Prior to Admission  Medication Sig Dispense Refill Last Dose   acetaminophen (TYLENOL) 500 MG tablet Take 500 mg by mouth every 6 (six) hours as needed for mild pain.   Past Week   hydrochlorothiazide (HYDRODIURIL) 25 MG tablet Take 25 mg by mouth every morning.   10/07/2021   latanoprost (XALATAN) 0.005 % ophthalmic solution Place 1 drop into both eyes at bedtime.   10/07/2021   levothyroxine (SYNTHROID, LEVOTHROID) 50 MCG tablet TAKE 1 TABLET EVERY DAY (Patient taking differently: Take 50 mcg by mouth daily before breakfast.) 90 tablet 1 10/07/2021   losartan (COZAAR) 50 MG tablet Take 50 mg by mouth daily.   10/07/2021   pantoprazole (PROTONIX) 40 MG tablet TAKE ONE TABLET BY MOUTH ONCE DAILY BEFORE  BREAKFAST (Patient taking differently: Take 40 mg by mouth daily.) 60 tablet 0 10/07/2021   APIXABAN (ELIQUIS) VTE STARTER PACK (10MG  AND 5MG ) Take as directed on package: start with two-5mg  tablets twice daily for 7 days. On day 8, switch to one-5mg  tablet twice daily. (Patient not taking: Reported  on 10/08/2021) 1 each 0 Not Taking    Scheduled:   dorzolamide-timolol  1 drop Both Eyes BID   hydrochlorothiazide  12.5 mg Oral q morning   latanoprost  1 drop Both Eyes QHS   levothyroxine  50 mcg Oral Q0600   losartan  50 mg Oral Daily   melatonin  3 mg Oral QHS   pantoprazole  40 mg Oral Daily   timolol  1 drop Both Eyes BID   Infusions:   sodium chloride 100 mL/hr at 10/08/21 1741   heparin 1,100 Units/hr (10/08/21 1737)   PRN:   Assessment: 67 yof with a history of HTN, HLD, CKD, GERD, DVT (not on AC), and hypothyroidism. Patient is presenting with chest pain. Heparin per pharmacy consult placed for pulmonary embolus.  CTA PE w/ submassive PE (RV/LV 1.6)  Patient is not on anticoagulation prior to arrival.  Hgb 13.6; plt 245  7/29 AM update:  Heparin level therapeutic   Goal of Therapy:   Heparin level 0.3-0.7 units/ml Monitor platelets by anticoagulation protocol: Yes   Plan:  Cont heparin infusion at 1100 units/hr Check anti-Xa level in 8 hours and daily while on heparin Continue to monitor H&H and platelets  Narda Bonds, PharmD, BCPS Clinical Pharmacist Phone: [phone]

## 2021-10-09 NOTE — Progress Notes (Signed)
Lower extremity venous bilateral study completed.  Preliminary results relayed to Posey Pronto, MD and Thomasena Edis, RN.  See CV Proc for preliminary results report.   Yesenia [name], RDMS, RVT

## 2021-10-09 NOTE — Plan of Care (Signed)
  Problem: Education: Goal: Knowledge of General Education information will improve Description: Including pain rating scale, medication(s)/side effects and non-pharmacologic comfort measures 10/09/2021 0720 by Lynnea Ferrier, RN Outcome: Progressing 10/09/2021 0031 by Lynnea Ferrier, RN Outcome: Progressing   Problem: Health Behavior/Discharge Planning: Goal: Ability to manage health-related needs will improve 10/09/2021 0720 by Lynnea Ferrier, RN Outcome: Progressing 10/09/2021 0031 by Lynnea Ferrier, RN Outcome: Progressing   Problem: Activity: Goal: Risk for activity intolerance will decrease 10/09/2021 0720 by Lynnea Ferrier, RN Outcome: Progressing 10/09/2021 0031 by Lynnea Ferrier, RN Outcome: Progressing   Problem: Nutrition: Goal: Adequate nutrition will be maintained Outcome: Progressing   Problem: Coping: Goal: Level of anxiety will decrease Outcome: Progressing

## 2021-10-09 NOTE — Progress Notes (Addendum)
[facility] for transition from heparin to apixaban Indication: pulmonary embolus  Allergies  Allergen Reactions   Cephalexin     REACTION: questionable   Codeine Nausea And Vomiting   Gabapentin     Headache   Sulfamethoxazole-Trimethoprim     REACTION: unspecified   Sulfonamide Derivatives    Vicodin [Hydrocodone-Acetaminophen] Other (See Comments)    MAKES HER CRAZY    Patient Measurements: Height: 5' (152.4 cm) Weight: 83 kg (183 lb) IBW/kg (Calculated) : 45.5 kg Heparin Dosing Weight: 64.7 kg   Vital Signs: Temp: 98 F (36.7 C) (07/29 0817) Temp Source: Oral (07/29 0817) BP: 151/67 (07/29 0817) Pulse Rate: 74 (07/29 0817)  Labs: Recent Labs    10/08/21 1253 10/08/21 1713 10/09/21 0130 10/09/21 1129  HGB 13.6  --  11.0*  --   HCT 41.3  --  32.9*  --   PLT 245  --  205  --   APTT  --  21*  --   --   LABPROT  --  13.7  --   --   INR  --  1.1  --   --   HEPARINUNFRC  --   --  0.66 0.69  CREATININE 1.73*  --  1.56*  --   TROPONINIHS 12 14  --   --    Estimated Creatinine Clearance: 22.9 mL/min (A) (by C-G formula based on SCr of 1.56 mg/dL (H)).  Medical History: Past Medical History:  Diagnosis Date   Anemia, unspecified    Breast cyst    Chest pain, unspecified    Depression    Diarrhea of presumed infectious origin    Diffuse cystic mastopathy    Diverticulosis 06-2009   mild- Colonoscopy    DJD (degenerative joint disease)    Dysmetabolic syndrome X    Edema    Esophageal reflux    Esophageal stricture    Gout, unspecified    Hiatal hernia    History of gallstones    History of Helicobacter pylori infection    HLD (hyperlipidemia)    HTN (hypertension)    Hypertrophied anal papilla    Hypopotassemia    Hypothyroidism    Iron deficiency anemia, unspecified    Lung nodule    Osteoarthritis    Pneumonia 1993   Solitary cyst of breast    Type II or unspecified type diabetes mellitus without mention of  complication, not stated as uncontrolled    pt. states not a diabetic   Unspecified sinusitis (chronic)    Vitamin B12 deficiency    Medications:  Scheduled:   dorzolamide-timolol  1 drop Both Eyes BID   latanoprost  1 drop Both Eyes QHS   levothyroxine  50 mcg Oral Q0600   melatonin  3 mg Oral QHS   pantoprazole  40 mg Oral Daily   Infusions:   heparin 1,100 Units/hr (10/08/21 1737)   Assessment: 74 yof with a history of HTN, HLD, CKD, GERD, and hypothyroidism. Patient has a history of left leg DVT in 2021 and was treated with apixaban. She completed apixaban course in 2022 and was not taking anticoagulation prior to admission. Patient found to have CTA PE w/submassive PE on admission. Patient was started on heparin on 7/28 and pharmacy consulted to transition to apixaban. Hemoglobin decreasing and platelets stable. No issues with bleeding reported.   Goal of Therapy:  Monitor platelets by anticoagulation protocol: Yes   Plan:  Start apixaban 10 mg BID for 7 days  then on 10/16/21 reduce to 5 mg BID. Stop heparin infusion when give first apixaban dose.  Continue to monitor H&H and platelets  Jeneen Rinks, Pharm.D PGY1 Pharmacy Resident 10/09/2021 12:50 PM _______________________________________________________________ I discussed / reviewed the pharmacy note by Dr. Robyne Askew and I agree with the resident's findings and plans as documented. Vaughan Basta BS, PharmD, BCPS Clinical Pharmacist 10/09/2021 1:12 PM  Contact: [phone] after 3 PM  "Be curious, not judgmental..." -Jamal Maes _______________________________________________________________

## 2021-10-10 ENCOUNTER — Observation Stay (HOSPITAL_COMMUNITY): Payer: Medicare Other

## 2021-10-10 DIAGNOSIS — I517 Cardiomegaly: Secondary | ICD-10-CM | POA: Diagnosis not present

## 2021-10-10 DIAGNOSIS — R918 Other nonspecific abnormal finding of lung field: Secondary | ICD-10-CM | POA: Diagnosis not present

## 2021-10-10 DIAGNOSIS — Z9049 Acquired absence of other specified parts of digestive tract: Secondary | ICD-10-CM | POA: Diagnosis not present

## 2021-10-10 DIAGNOSIS — I2699 Other pulmonary embolism without acute cor pulmonale: Secondary | ICD-10-CM | POA: Diagnosis not present

## 2021-10-10 LAB — COMPREHENSIVE METABOLIC PANEL
ALT: 11 U/L (ref 0–44)
AST: 20 U/L (ref 15–41)
Albumin: 3 g/dL — ABNORMAL LOW (ref 3.5–5.0)
Alkaline Phosphatase: 72 U/L (ref 38–126)
Anion gap: 6 (ref 5–15)
BUN: 17 mg/dL (ref 8–23)
CO2: 24 mmol/L (ref 22–32)
Calcium: 8.9 mg/dL (ref 8.9–10.3)
Chloride: 106 mmol/L (ref 98–111)
Creatinine, Ser: 1.6 mg/dL — ABNORMAL HIGH (ref 0.44–1.00)
GFR, Estimated: 30 mL/min — ABNORMAL LOW (ref >60)
Glucose, Bld: 152 mg/dL — ABNORMAL HIGH (ref 70–99)
Potassium: 4.2 mmol/L (ref 3.5–5.1)
Sodium: 136 mmol/L (ref 135–145)
Total Bilirubin: 1.4 mg/dL — ABNORMAL HIGH (ref 0.3–1.2)
Total Protein: 6.2 g/dL — ABNORMAL LOW (ref 6.5–8.1)

## 2021-10-10 LAB — CBC
HCT: 34.8 % — ABNORMAL LOW (ref 36.0–46.0)
Hemoglobin: 11.8 g/dL — ABNORMAL LOW (ref 12.0–15.0)
MCH: 31.9 pg (ref 26.0–34.0)
MCHC: 33.9 g/dL (ref 30.0–36.0)
MCV: 94.1 fL (ref 80.0–100.0)
Platelets: 224 10*3/uL (ref 150–400)
RBC: 3.7 MIL/uL — ABNORMAL LOW (ref 3.87–5.11)
RDW: 12.8 % (ref 11.5–15.5)
WBC: 7.9 10*3/uL (ref 4.0–10.5)
nRBC: 0 % (ref 0.0–0.2)

## 2021-10-10 LAB — LIPASE, BLOOD: Lipase: 43 U/L (ref 11–51)

## 2021-10-10 MED ORDER — APIXABAN (ELIQUIS) VTE STARTER PACK (10MG AND 5MG): ORAL_TABLET | ORAL | 0 refills | Status: DC

## 2021-10-10 MED ORDER — AMLODIPINE BESYLATE 5 MG PO TABS: 5.0000 mg | ORAL_TABLET | Freq: Every day | ORAL | 0 refills | Status: DC

## 2021-10-10 MED ORDER — METOCLOPRAMIDE HCL 5 MG/ML IJ SOLN
5.0000 mg | Freq: Once | INTRAMUSCULAR | Status: AC
  Administered 2021-10-10: 5 mg via INTRAVENOUS
  Filled 2021-10-10: qty 2

## 2021-10-10 NOTE — Progress Notes (Signed)
Pt ambulated greater than 30 feet on RA with SpO2 greater than 92% throughout

## 2021-10-10 NOTE — Plan of Care (Signed)
  Problem: Education: Goal: Knowledge of General Education information will improve Description: Including pain rating scale, medication(s)/side effects and non-pharmacologic comfort measures Outcome: Progressing   Problem: Health Behavior/Discharge Planning: Goal: Ability to manage health-related needs will improve Outcome: Progressing   Problem: Activity: Goal: Risk for activity intolerance will decrease Outcome: Progressing   

## 2021-10-10 NOTE — TOC Transition Note (Signed)
Transition of Care [facility]) - CM/SW Discharge Note   Patient Details  Name: Yesenia [name] MRN: [id number] Date of Birth: 10/31/1929  [facility] of [facility] CM/SW Contact:  Bartholomew Crews, RN Phone Number: [phone] 10/10/2021, 2:38 PM   Clinical Narrative:     Patient to transition home today. Unable to find accepting [facility] agency. [facility] declined referral. [facility] does not cover patient's geographic area.   Final next level of care: Home/Self Care Barriers to Discharge: No Barriers Identified   Patient Goals and CMS Choice Patient states their goals for this hospitalization and ongoing recovery are:: return home with daughter and spouse CMS Medicare.gov Compare Post Acute Care list provided to:: Patient Choice offered to / list presented to : Patient  Discharge Placement                       Discharge Plan and Services   Discharge Planning Services: CM Consult Post Acute Care Choice: Home Health          DME Arranged: N/A DME Agency: NA                  Social Determinants of Health (SDOH) Interventions     Readmission Risk Interventions     No data to display

## 2021-10-11 NOTE — Telephone Encounter (Signed)
ATC. Pt was asleep at time of call and I was requested to call back later this afternoon. Will attempt again later today.

## 2021-10-14 NOTE — Discharge Summary (Addendum)
Physician Discharge Summary   Patient: Yesenia [name] MRN: [id number] DOB: 1929/10/28  Admit date:     10/08/2021  Discharge date: 10/10/2021  Discharge Physician: Berle Mull  PCP: Lawerance Cruel, MD  Recommendations at discharge: Follow-up with PCP in 1 week.  Discharge Diagnoses: Principal Problem:   Recurrent pulmonary emboli (HCC) Active Problems:   Chest pain, pleuritic   Hypothyroidism   Hyperlipidemia with target low density lipoprotein (LDL) cholesterol less than 100 mg/dL   HYPERTENSION, MILD   GERD (gastroesophageal reflux disease)   Kidney disease, chronic, stage IV (GFR 15-29 ml/min) (HCC)   Pulmonary emboli (HCC) Moderate pericardial effusion  Hospital Course: Yesenia [name] is a 88 y.o. female with medical history significant of DVT off Eliquis, HTN, CKD stage IIIb, GERD, HLD, hypothyroidism, presented with worsening of chest pain.  Found to have unprovoked acute PE as well as extensive bilateral lower extremity DVT.   Assessment and Plan: Recurrent pulmonary embolism causing chest pain. Acute bilateral lower extremity DVT. Patient's presents with complaints of chest pain currently resolved.  Has some mild shortness of breath.  Ambulation oxygenation remained stable above 90%.  No dizziness or lightheadedness.  No symptoms of orthostasis.  Currently not on any oxygen. Has prior history of DVT on the left leg. Ultrasound Doppler shows presence of DVT all the way and common femoral vein on the right.  On the left it only shows an common femoral vein but nowhere else. Patient was on IV heparin.  We will transition to oral Eliquis. Echocardiogram actually showed no evidence of RV strain.   CKD 3B. Patient actually is on ACE inhibitor and diuretic. Patient also received IV contrast. Patient was given IV fluid currently I will stop it. Renal function stable.  Repeat BMP with PCP.  Hypertensive urgency. Blood pressure elevated.  No symptoms. Continue  amlodipine. Follow-up with PCP and cardiology as recommended  Hypothyroidism. We will continue Synthroid.   Obesity. Body mass index is 35.74 kg/m.  Placing the pt at higher risk of poor outcomes.  Moderate pericardial effusion. Incidental finding. Patient asymptomatic for now.  Ambulation. No chest pain. Outpatient follow-up with PCP. Repeat echocardiogram as needed based on goals of care conversation with PCP.  Consultants: none Procedures performed:  Echocardiogram DISCHARGE MEDICATION: Allergies as of 10/10/2021       Reactions   Cephalexin    REACTION: questionable   Codeine Nausea And Vomiting   Gabapentin    Headache   Sulfamethoxazole-trimethoprim    REACTION: unspecified   Sulfonamide Derivatives    Vicodin [hydrocodone-acetaminophen] Other (See Comments)   MAKES HER CRAZY        Medication List     STOP taking these medications    hydrochlorothiazide 25 MG tablet Commonly known as: HYDRODIURIL       TAKE these medications    acetaminophen 500 MG tablet Commonly known as: TYLENOL Take 500 mg by mouth every 6 (six) hours as needed for mild pain.   amLODipine 5 MG tablet Commonly known as: NORVASC Take 1 tablet (5 mg total) by mouth daily.   Apixaban Starter Pack (57m and 534m Commonly known as: ELIQUIS STARTER PACK Take as directed on package: start with two-67m10mablets twice daily for 7 days. On day 8, switch to one-67mg467mblet twice daily.   latanoprost 0.005 % ophthalmic solution Commonly known as: XALATAN Place 1 drop into both eyes at bedtime.   levothyroxine 50 MCG tablet Commonly known as: SYNTHROID TAKE 1 TABLET EVERY DAY What  changed: when to take this   losartan 50 MG tablet Commonly known as: COZAAR Take 50 mg by mouth daily.   pantoprazole 40 MG tablet Commonly known as: PROTONIX TAKE ONE TABLET BY MOUTH ONCE DAILY BEFORE  BREAKFAST What changed: See the new instructions.        Follow-up Information     Lawerance Cruel, MD. Schedule an appointment as soon as possible for a visit in 1 week(s).   Specialty: Family Medicine Why: with CBC and BMP Contact information: [place] [address] [phone]                Disposition: Home Diet recommendation: Cardiac diet  Discharge Exam: Vitals:   10/09/21 2006 10/10/21 0427 10/10/21 0716 10/10/21 1435  BP: (!) 156/73 (!) 146/70 (!) 131/52 126/64  Pulse: 91 77 91 71  Resp: _0 Temp: 98 F (36.7 C) 97.9 F (36.6 C) 97.7 F (36.5 C) 98.4 F (36.9 C)  TempSrc:   Oral Oral  SpO2: 96% 92% 96% 94%  Weight:      Height:       General: Appear in no distress; no visible Abnormal Neck Mass Or lumps, Conjunctiva normal Cardiovascular: S1 and S2 Present, no Murmur, Respiratory: good respiratory effort, Bilateral Air entry present and CTA, no Crackles, no wheezes Abdomen: Bowel Sound present, Non tender  Extremities: trace Pedal edema Neurology: alert and oriented to time, place, and person  Gait not checked due to patient safety concerns Filed Weights   10/08/21 1212  Weight: 83 kg   Condition at discharge: stable  The results of significant diagnostics from this hospitalization (including imaging, microbiology, ancillary and laboratory) are listed below for reference.   Imaging Studies: DG Abd Portable 1V  Result Date: 10/10/2021 CLINICAL DATA:  Pulmonary emboli EXAM: PORTABLE ABDOMEN - 1 VIEW COMPARISON:  None Available. FINDINGS: There appears to be a small left effusion with underlying atelectasis. No free air, portal venous gas, or pneumatosis identified on supine imaging. Cholecystectomy clips are identified. There is a paucity of bowel gas limiting evaluation but no evidence of ileus or obstruction. No other acute abnormalities. IMPRESSION: No acute abnormalities identified in the abdomen. Electronically Signed   By: Dorise Bullion III M.D.   On: 10/10/2021 10:53   DG CHEST PORT 1 VIEW  Result  Date: 10/10/2021 CLINICAL DATA:  Pulmonary emboli EXAM: PORTABLE CHEST 1 VIEW COMPARISON:  October 08, 2021 chest x-ray FINDINGS: No pneumothorax. Stable cardiomegaly. The hila and mediastinum are unchanged. The right lung is clear. New opacity in the left base. IMPRESSION: 1. New left basilar opacity may represent effusion with underlying atelectasis versus infiltrate. Recommend follow-up to resolution. No other acute abnormalities are identified. Electronically Signed   By: Dorise Bullion III M.D.   On: 10/10/2021 10:52   VAS [place] LOWER EXTREMITY VENOUS (DVT)  Result Date: 10/10/2021  Lower Venous DVT Study Patient Name:  MATRICIA BEGNAUD  Date of Exam:   10/09/2021 Medical Rec #: [id number]        Accession #:    [id number] Date of Birth: [birth year]        Patient Gender: F Patient Age:   23 years Exam Location:  [facility] Procedure:      VAS [place] LOWER EXTREMITY VENOUS (DVT) Referring Phys: Freda Jackson --------------------------------------------------------------------------------  Indications: Edema, history of DVT.  Limitations: Limited compression distally secondary to more proximal findings. Comparison Study: 05-04-2020 Prior left lower extremity venous  study was                   positive for extensive DVT. Performing Technologist: Darlin Coco RDMS, RVT  Examination Guidelines: A complete evaluation includes B-mode imaging, spectral Doppler, color Doppler, and power Doppler as needed of all accessible portions of each vessel. Bilateral testing is considered an integral part of a complete examination. Limited examinations for reoccurring indications may be performed as noted. The reflux portion of the exam is performed with the patient in reverse Trendelenburg.  +---------+---------------+---------+-----------+----------+--------------+ RIGHT    CompressibilityPhasicitySpontaneityPropertiesThrombus Aging +---------+---------------+---------+-----------+----------+--------------+ CFV       Partial        Yes      Yes                  Acute          +---------+---------------+---------+-----------+----------+--------------+ SFJ      Partial        Yes      Yes                  Acute          +---------+---------------+---------+-----------+----------+--------------+ FV Prox  Partial        Yes      Yes                  Acute          +---------+---------------+---------+-----------+----------+--------------+ FV Mid                  Yes      Yes                                 +---------+---------------+---------+-----------+----------+--------------+ FV Distal               Yes      Yes                                 +---------+---------------+---------+-----------+----------+--------------+ PFV      Full                                                        +---------+---------------+---------+-----------+----------+--------------+ POP                     Yes      Yes                                 +---------+---------------+---------+-----------+----------+--------------+ PTV      None           No       No                   Acute          +---------+---------------+---------+-----------+----------+--------------+ PERO                    Yes      Yes                                 +---------+---------------+---------+-----------+----------+--------------+ EIV  Yes      Yes                                 +---------+---------------+---------+-----------+----------+--------------+   +---------+---------------+---------+-----------+----------+--------------+ LEFT     CompressibilityPhasicitySpontaneityPropertiesThrombus Aging +---------+---------------+---------+-----------+----------+--------------+ CFV      Partial        Yes      Yes                  Acute          +---------+---------------+---------+-----------+----------+--------------+ SFJ                     Yes      Yes                                  +---------+---------------+---------+-----------+----------+--------------+ FV Prox  Full           Yes      Yes                                 +---------+---------------+---------+-----------+----------+--------------+ FV Mid                  Yes      Yes                                 +---------+---------------+---------+-----------+----------+--------------+ FV Distal               Yes      Yes                                 +---------+---------------+---------+-----------+----------+--------------+ PFV      Full           Yes      Yes                                 +---------+---------------+---------+-----------+----------+--------------+ POP                     Yes      Yes                                 +---------+---------------+---------+-----------+----------+--------------+ PTV                     Yes      Yes                                 +---------+---------------+---------+-----------+----------+--------------+ PERO                    Yes      Yes                                 +---------+---------------+---------+-----------+----------+--------------+     Summary: RIGHT: - Findings consistent with acute deep vein thrombosis involving the right common femoral vein, SF junction, right femoral vein, and right posterior tibial veins. -  No cystic structure found in the popliteal fossa. - Common femoral vein obstruction doesn't appear to extend above inguinal ligament.  LEFT: - Findings consistent with acute deep vein thrombosis involving the left common femoral vein. - No cystic structure found in the popliteal fossa. - The common femoral vein obstruction does not appear to extend above inguinal ligament.  *See table(s) above for measurements and observations. Electronically signed by Jamelle Haring on 10/10/2021 at 9:25:55 AM.    Final    ECHOCARDIOGRAM LIMITED  Result Date: 10/08/2021    ECHOCARDIOGRAM LIMITED REPORT   Patient  Name:   TING CAGE Date of Exam: 10/08/2021 Medical Rec #:  [id number]       Height:       60.0 in Accession #:    [id number]      Weight:       183.0 lb Date of Birth:  [birth year]       BSA:          1.797 m Patient Age:    71 years        BP:           159/92 mmHg Patient Gender: F               HR:           86 bpm. Exam Location:  Inpatient Procedure: Limited Echo, Cardiac Doppler and Color Doppler Indications:    Pulmonary embolus  History:        Patient has prior history of Echocardiogram examinations, most                 recent 07/30/2019. Risk Factors:Hypertension and Dyslipidemia.                 Non-rheumatic mivtral valve stenosis. MAC. CKD. Hx DVT.  Sonographer:    Clayton Lefort RDCS (AE) Referring Phys: [id number] Freddi Starr  Sonographer Comments: Suboptimal parasternal window. IMPRESSIONS  1. Left ventricular ejection fraction, by estimation, is 60 to 65%. The left ventricle has normal function. There is moderate left ventricular hypertrophy. Left ventricular diastolic parameters are consistent with Grade I diastolic dysfunction (impaired  relaxation).  2. Right ventricular systolic function is normal. The right ventricular size is normal. There is normal pulmonary artery systolic pressure.  3. Subcostal view not well visualized; moderate pericardial effusion along the RV free wall. There is no evidence of cardiac tamponade.  4. The mitral valve is degenerative. Trivial mitral valve regurgitation. Severe mitral annular calcification.  5. Aortic valve regurgitation is not visualized.  6. The inferior vena cava is normal in size with greater than 50% respiratory variability, suggesting right atrial pressure of 3 mmHg. FINDINGS  Left Ventricle: Left ventricular ejection fraction, by estimation, is 60 to 65%. The left ventricle has normal function. There is moderate left ventricular hypertrophy. Left ventricular diastolic parameters are consistent with Grade I diastolic dysfunction (impaired  relaxation). Right Ventricle: The right ventricular size is normal. Right ventricular systolic function is normal. There is normal pulmonary artery systolic pressure. The tricuspid regurgitant velocity is 2.84 m/s, and with an assumed right atrial pressure of 3 mmHg,  the estimated right ventricular systolic pressure is 36.6 mmHg. Right Atrium: Right atrial size was normal in size. Pericardium: Subcostal view not well visualized; moderate pericardial effusion along the RV free wall. There is no evidence of cardiac tamponade. Mitral Valve: The mitral valve is degenerative in appearance. Severe mitral annular calcification. Trivial mitral valve regurgitation. MV peak gradient, 20.1 mmHg.  The mean mitral valve gradient is 10.0 mmHg. Tricuspid Valve: Tricuspid valve regurgitation is mild. Aortic Valve: Aortic valve regurgitation is not visualized. Pulmonic Valve: Pulmonic valve regurgitation is not visualized. Aorta: The aortic root and ascending aorta are structurally normal, with no evidence of dilitation. Venous: The inferior vena cava is normal in size with greater than 50% respiratory variability, suggesting right atrial pressure of 3 mmHg. LEFT VENTRICLE PLAX 2D LVIDd:         3.60 cm   Diastology LVIDs:         2.50 cm   LV e' medial:    5.87 cm/s LV PW:         1.20 cm   LV E/e' medial:  27.9 LV IVS:        1.60 cm   LV e' lateral:   8.59 cm/s LVOT diam:     1.90 cm   LV E/e' lateral: 19.1 LVOT Area:     2.84 cm  IVC IVC diam: 1.20 cm LEFT ATRIUM         Index LA diam:    3.70 cm 2.06 cm/m   AORTA Ao Root diam: 2.60 cm Ao Asc diam:  2.40 cm MITRAL VALVE                TRICUSPID VALVE MV Area (PHT): 1.72 cm     TR Peak grad:   32.3 mmHg MV Peak grad:  20.1 mmHg    TR Vmax:        284.00 cm/s MV Mean grad:  10.0 mmHg MV Vmax:       2.24 m/s     SHUNTS MV Vmean:      156.0 cm/s   Systemic Diam: 1.90 cm MV Decel Time: 440 msec MV E velocity: 164.00 cm/s MV A velocity: 229.00 cm/s MV E/A ratio:  0.72 Mary [employer] promotion account executive signed by Phineas Inches Signature Date/Time: 10/08/2021/6:10:42 PM    Final    CT Angio Chest PE W and/or Wo Contrast  Result Date: 10/08/2021 CLINICAL DATA:  History lower extremity thrombus. Left chest pain, "High clinical suspicion for pulmonary embolus" EXAM: CT ANGIOGRAPHY CHEST WITH CONTRAST TECHNIQUE: Multidetector CT imaging of the chest was performed using the standard protocol during bolus administration of intravenous contrast. Multiplanar CT image reconstructions and MIPs were obtained to evaluate the vascular anatomy. RADIATION DOSE REDUCTION: This exam was performed according to the departmental dose-optimization program which includes automated exposure control, adjustment of the mA and/or kV according to patient size and/or use of iterative reconstruction technique. CONTRAST:  41m OMNIPAQUE IOHEXOL 350 MG/ML SOLN COMPARISON:  10/08/2021 chest radiograph; chest CT from 04/11/2007 FINDINGS: Cardiovascular: There is notable peripheral thrombus in the left pulmonary artery superiorly for example on image 112 of series 9. Suspected associated occlusion of the posterior segmental branch of the left upper lobe pulmonary artery, with associated left apical airspace opacity on image 10 of series 11 which could be from pulmonary hemorrhage or infarct. Right ventricular to left ventricular ratio is 1.6. Coronary, aortic arch, and branch vessel atherosclerotic vascular disease. Dense mitral valve calcifications. Mediastinum/Nodes: Small type 1 hiatal hernia. Lungs/Pleura: Small left pleural effusion. Patchy airspace opacity posteriorly in the left upper lobe, possibly pulmonary infarct or hemorrhage given the pulmonary arterial findings. There narrowed segments of the tracheobronchial tree, raising the possibility of tracheobronchomalacia. Upper Abdomen: Exophytic fluid density right kidney upper pole lesion, probably a cyst but only partially included on today's exam. Musculoskeletal:  Degenerative glenohumeral arthropathy bilaterally.  Thoracic spondylosis. Grade 1 degenerative anterolisthesis at T1-2. Review of the MIP images confirms the above findings. IMPRESSION: 1. Peripheral low-density filling defect compatible with thrombus superiorly in the left pulmonary artery, appearing to occlude the apicoposterior segmental pulmonary artery branch. Although the peripheral location of the clot in the pulmonary artery would tend to argue for chronic embolus, the occlusion of the apicoposterior segmental branches, the small left pleural effusion, the patient's left eccentric chest pain, and the airspace opacity posteriorly in the left upper lobe argue for acute pulmonary embolus with possible pulmonary hemorrhage or pulmonary infarct. Positive for acute PE with CT evidence of right heart strain (RV/LV Ratio = 1.6) consistent with at least submassive (intermediate risk) PE. The presence of right heart strain has been associated with an increased risk of morbidity and mortality. Please refer to the "Code PE Focused" order set in EPIC. 2. Possible tracheobronchomalacia. 3.  Aortic Atherosclerosis (ICD10-I70.0).  Coronary atherosclerosis. 4. Small type 1 hiatal hernia. 5. Dense mitral valve calcifications. Critical Value/emergent results were called by telephone at the time of interpretation on 10/08/2021 at 3:55 pm to provider Dr. Sherwood Gambler , who verbally acknowledged these results. Electronically Signed   By: Van Clines M.D.   On: 10/08/2021 16:06   DG Chest Port 1 View  Result Date: 10/08/2021 CLINICAL DATA:  Chest pain EXAM: PORTABLE CHEST 1 VIEW COMPARISON:  CT chest 04/11/2007 FINDINGS: The heart size and mediastinal contours are within normal limits. Both lungs are clear. The visualized skeletal structures are unremarkable. IMPRESSION: No active disease. Electronically Signed   By: Kathreen Devoid M.D.   On: 10/08/2021 13:03    Microbiology: Results for orders placed or performed  during the hospital encounter of 10/08/21  SARS Coronavirus 2 by RT PCR (hospital order, performed in [facility] hospital lab) *cepheid single result test* Anterior Nasal Swab     Status: None   Collection Time: 10/08/21  3:36 PM   Specimen: Anterior Nasal Swab  Result Value Ref Range Status   SARS Coronavirus 2 by RT PCR NEGATIVE NEGATIVE Final    Comment: (NOTE) SARS-CoV-2 target nucleic acids are NOT DETECTED.  The SARS-CoV-2 RNA is generally detectable in upper and lower respiratory specimens during the acute phase of infection. The lowest concentration of SARS-CoV-2 viral copies this assay can detect is 250 copies / mL. A negative result does not preclude SARS-CoV-2 infection and should not be used as the sole basis for treatment or other patient management decisions.  A negative result may occur with improper specimen collection / handling, submission of specimen other than nasopharyngeal swab, presence of viral mutation(s) within the areas targeted by this assay, and inadequate number of viral copies (<250 copies / mL). A negative result must be combined with clinical observations, patient history, and epidemiological information.  Fact Sheet for Patients:   https://www.Taniesha Glanz.info/  Fact Sheet for Healthcare Providers: https://hall.com/  This test is not yet approved or  cleared by the [place] FDA and has been authorized for detection and/or diagnosis of SARS-CoV-2 by FDA under an Emergency Use Authorization (EUA).  This EUA will remain in effect (meaning this test can be used) for the duration of the COVID-19 declaration under Section 564(b)(1) of the Act, 21 U.S.C. section 360bbb-3(b)(1), unless the authorization is terminated or revoked sooner.  Performed at [facility], [place] [address], [place], [place] [address]     Labs: CBC: Recent Labs  Lab 10/08/21 1253 10/09/21 0130 10/10/21 0214  WBC 10.7* 9.3  7.9   HGB 13.6 11.0* 11.8*  HCT 41.3 32.9* 34.8*  MCV 96.9 94.5 94.1  PLT 245 205 454   Basic Metabolic Panel: Recent Labs  Lab 10/08/21 1253 10/09/21 0130 10/10/21 1221  NA 139 137 136  K 4.2 3.8 4.2  CL 103 105 106  CO2 _0 GLUCOSE 109* 106* 152*  BUN _1 CREATININE 1.73* 1.56* 1.60*  CALCIUM 9.2 8.1* 8.9   Liver Function Tests: Recent Labs  Lab 10/10/21 1221  AST 20  ALT 11  ALKPHOS 72  BILITOT 1.4*  PROT 6.2*  ALBUMIN 3.0*   CBG: No results for input(s): "GLUCAP" in the last 168 hours.  Discharge time spent: greater than 30 minutes.  Signed: Berle Mull, MD [facility] 10/10/2021

## 2021-11-10 ENCOUNTER — Ambulatory Visit (INDEPENDENT_AMBULATORY_CARE_PROVIDER_SITE_OTHER)
Admission: RE | Admit: 2021-11-10 | Discharge: 2021-11-10 | Disposition: A | Discharge status: Home / Self Care | Payer: Medicare Other | Source: Ambulatory Visit | Attending: Pulmonary Disease | Admitting: Pulmonary Disease

## 2021-11-10 ENCOUNTER — Encounter: Payer: Self-pay | Admitting: Pulmonary Disease

## 2021-11-10 ENCOUNTER — Other Ambulatory Visit: Payer: Self-pay | Admitting: Pulmonary Disease

## 2021-11-10 ENCOUNTER — Ambulatory Visit: Payer: Medicare Other | Admitting: Pulmonary Disease

## 2021-11-10 ENCOUNTER — Other Ambulatory Visit (INDEPENDENT_AMBULATORY_CARE_PROVIDER_SITE_OTHER): Payer: Medicare Other

## 2021-11-10 VITALS — BP 130/80 | HR 93 | Ht 60.0 in | Wt 184.6 lb

## 2021-11-10 DIAGNOSIS — N184 Chronic kidney disease, stage 4 (severe): Secondary | ICD-10-CM

## 2021-11-10 DIAGNOSIS — R112 Nausea with vomiting, unspecified: Secondary | ICD-10-CM | POA: Diagnosis not present

## 2021-11-10 DIAGNOSIS — I2699 Other pulmonary embolism without acute cor pulmonale: Secondary | ICD-10-CM

## 2021-11-10 DIAGNOSIS — R0600 Dyspnea, unspecified: Secondary | ICD-10-CM | POA: Diagnosis not present

## 2021-11-10 LAB — URINALYSIS, ROUTINE W REFLEX MICROSCOPIC
Ketones, ur: NEGATIVE
Nitrite: NEGATIVE
Specific Gravity, Urine: 1.025 (ref 1.000–1.030)
Total Protein, Urine: 30 — AB
Urine Glucose: NEGATIVE
Urobilinogen, UA: 0.2 (ref 0.0–1.0)
pH: 6 (ref 5.0–8.0)

## 2021-11-10 LAB — COMPREHENSIVE METABOLIC PANEL
ALT: 8 U/L (ref 0–35)
AST: 13 U/L (ref 0–37)
Albumin: 4 g/dL (ref 3.5–5.2)
Alkaline Phosphatase: 62 U/L (ref 39–117)
BUN: 18 mg/dL (ref 6–23)
CO2: 29 mEq/L (ref 19–32)
Calcium: 9.5 mg/dL (ref 8.4–10.5)
Chloride: 101 mEq/L (ref 96–112)
Creatinine, Ser: 1.37 mg/dL — ABNORMAL HIGH (ref 0.40–1.20)
GFR: 34 mL/min — ABNORMAL LOW (ref >60.00)
Glucose, Bld: 116 mg/dL — ABNORMAL HIGH (ref 70–99)
Potassium: 3.4 mEq/L — ABNORMAL LOW (ref 3.5–5.1)
Sodium: 143 mEq/L (ref 135–145)
Total Bilirubin: 1 mg/dL (ref 0.2–1.2)
Total Protein: 7.3 g/dL (ref 6.0–8.3)

## 2021-11-10 LAB — CBC WITH DIFFERENTIAL/PLATELET
Basophils Absolute: 0.1 10*3/uL (ref 0.0–0.1)
Basophils Relative: 1.2 % (ref 0.0–3.0)
Eosinophils Absolute: 0.2 10*3/uL (ref 0.0–0.7)
Eosinophils Relative: 2.2 % (ref 0.0–5.0)
HCT: 40.2 % (ref 36.0–46.0)
Hemoglobin: 13.7 g/dL (ref 12.0–15.0)
Lymphocytes Relative: 20.9 % (ref 12.0–46.0)
Lymphs Abs: 2.1 10*3/uL (ref 0.7–4.0)
MCHC: 33.9 g/dL (ref 30.0–36.0)
MCV: 94.9 fl (ref 78.0–100.0)
Monocytes Absolute: 0.8 10*3/uL (ref 0.1–1.0)
Monocytes Relative: 7.8 % (ref 3.0–12.0)
Neutro Abs: 6.8 10*3/uL (ref 1.4–7.7)
Neutrophils Relative %: 67.9 % (ref 43.0–77.0)
Platelets: 295 10*3/uL (ref 150.0–400.0)
RBC: 4.24 Mil/uL (ref 3.87–5.11)
RDW: 14.4 % (ref 11.5–15.5)
WBC: 10 10*3/uL (ref 4.0–10.5)

## 2021-11-10 LAB — LIPASE: Lipase: 61 U/L — ABNORMAL HIGH (ref 11.0–59.0)

## 2021-11-10 NOTE — Progress Notes (Signed)
Synopsis: Referred in August 2023 for Pulmonary Emboli  Subjective:   PATIENT ID: Yesenia [name] GENDER: female DOB: 03-Mar-1930, MRN: [id number]  HPI  Chief Complaint  Patient presents with   Consult    Pt hospital f/u pt admitted for PE, having SOB, vomiting, and tiredness.    Yesenia [name] is a 88 year old woman, former smoker with history of DVT, hypertension, CKD IIIb and DMII who is referred to pulmonary clinic for hospital follow up of pulmonary embolism.   She was admitted 7/28 to 7/30 for pulmonary embolism and acute bilateral lower extremity DVT. Echo showed no evidence of right heart strain. She was treated with heparin initially and transitioned to eliquis.  She reports nausea and vomiting since discharge. She is throwing up clear liquid. She is tolerating some eating and drinking. She is passing some gas from below. Her husband passed away on 10-26-22. Her daughter thinks her nausea/vomiting may be due to stress/grief. She also reports loose stools. She reports foul smelling urine.  She denies any bleeding issues since being on eliquis.  Past Medical History:  Diagnosis Date   Anemia, unspecified    Breast cyst    Chest pain, unspecified    Depression    Diarrhea of presumed infectious origin    Diffuse cystic mastopathy    Diverticulosis 06-2009   mild- Colonoscopy    DJD (degenerative joint disease)    Dysmetabolic syndrome X    Edema    Esophageal reflux    Esophageal stricture    Gout, unspecified    Hiatal hernia    History of gallstones    History of Helicobacter pylori infection    HLD (hyperlipidemia)    HTN (hypertension)    Hypertrophied anal papilla    Hypopotassemia    Hypothyroidism    Iron deficiency anemia, unspecified    Lung nodule    Osteoarthritis    Pneumonia 1993   Solitary cyst of breast    Type II or unspecified type diabetes mellitus without mention of complication, not stated as uncontrolled    pt. states not a diabetic    Unspecified sinusitis (chronic)    Vitamin B12 deficiency      Family History  Problem Relation Age of Onset   Stroke Mother    Heart disease Father    Stomach cancer Brother    Breast cancer Maternal Aunt    Cancer Brother        bladder   Colon cancer Brother      Social History   Socioeconomic History   Marital status: Married    Spouse name: Not on file   Number of children: 5   Years of education: Not on file   Highest education level: Not on file  Occupational History   Occupation: Retired    Fish farm manager: RETIRED  Tobacco Use   Smoking status: Former    Packs/day: 0.25    Years: 5.00    Total pack years: 1.25    Types: Cigarettes    Quit date: 03/20/1988    Years since quitting: 33.6   Smokeless tobacco: Current    Types: Snuff  Vaping Use   Vaping Use: Never used  Substance and Sexual Activity   Alcohol use: No   Drug use: No   Sexual activity: Not Currently  Other Topics Concern   Not on file  Social History Narrative   Regular exercise-yes   Social Determinants of Health   Financial Resource Strain: Not on file  Food Insecurity: Not on file  Transportation Needs: Not on file  Physical Activity: Not on file  Stress: Not on file  Social Connections: Not on file  Intimate Partner Violence: Not on file     Allergies  Allergen Reactions   Cephalexin     REACTION: questionable   Codeine Nausea And Vomiting   Gabapentin     Headache   Sulfamethoxazole-Trimethoprim     REACTION: unspecified   Sulfonamide Derivatives    Vicodin [Hydrocodone-Acetaminophen] Other (See Comments)    MAKES HER CRAZY      Outpatient Medications Prior to Visit  Medication Sig Dispense Refill   acetaminophen (TYLENOL) 500 MG tablet Take 500 mg by mouth every 6 (six) hours as needed for mild pain.     APIXABAN (ELIQUIS) VTE STARTER PACK (10MG AND 5MG) Take as directed on package: start with two-55m tablets twice daily for 7 days. On day 8, switch to one-563mtablet twice  daily. 1 each 0   latanoprost (XALATAN) 0.005 % ophthalmic solution Place 1 drop into both eyes at bedtime.     levothyroxine (SYNTHROID, LEVOTHROID) 50 MCG tablet TAKE 1 TABLET EVERY DAY (Patient taking differently: Take 50 mcg by mouth daily before breakfast.) 90 tablet 1   losartan (COZAAR) 50 MG tablet Take 50 mg by mouth daily.     pantoprazole (PROTONIX) 40 MG tablet TAKE ONE TABLET BY MOUTH ONCE DAILY BEFORE  BREAKFAST (Patient taking differently: Take 40 mg by mouth daily.) 60 tablet 0   amLODipine (NORVASC) 5 MG tablet Take 1 tablet (5 mg total) by mouth daily. (Patient not taking: Reported on 11/10/2021) 30 tablet 0   No facility-administered medications prior to visit.    Review of Systems  Constitutional:  Negative for chills, fever, malaise/fatigue and weight loss.  HENT:  Negative for congestion, sinus pain and sore throat.   Eyes: Negative.   Respiratory:  Positive for shortness of breath. Negative for cough, hemoptysis, sputum production and wheezing.   Cardiovascular:  Negative for chest pain, palpitations, orthopnea, claudication and leg swelling.  Gastrointestinal:  Positive for abdominal pain, diarrhea, nausea and vomiting. Negative for heartburn.  Genitourinary: Negative.   Musculoskeletal:  Negative for joint pain and myalgias.  Skin:  Negative for rash.  Neurological:  Negative for weakness.  Endo/Heme/Allergies: Negative.   Psychiatric/Behavioral: Negative.     Objective:   Vitals:   11/10/21 0935  BP: 130/80  Pulse: 93  SpO2: 95%  Weight: 184 lb 9.6 oz (83.7 kg)  Height: 5' (1.524 m)    Physical Exam Constitutional:      General: She is not in acute distress.    Appearance: She is not ill-appearing.  HENT:     Head: Normocephalic and atraumatic.  Eyes:     General: No scleral icterus.    Conjunctiva/sclera: Conjunctivae normal.     Pupils: Pupils are equal, round, and reactive to light.  Cardiovascular:     Rate and Rhythm: Normal rate and regular  rhythm.     Pulses: Normal pulses.     Heart sounds: Normal heart sounds. No murmur heard. Pulmonary:     Effort: Pulmonary effort is normal.     Breath sounds: Normal breath sounds. No wheezing, rhonchi or rales.  Abdominal:     General: Bowel sounds are normal. There is no distension.     Palpations: Abdomen is soft. There is no mass.     Tenderness: There is no abdominal tenderness. There is no guarding or rebound.  Hernia: No hernia is present.  Musculoskeletal:     Right lower leg: Edema present.     Left lower leg: Edema present.  Skin:    General: Skin is warm and dry.  Neurological:     General: No focal deficit present.     Mental Status: She is alert.  Psychiatric:        Mood and Affect: Mood normal.        Behavior: Behavior normal.        Thought Content: Thought content normal.        Judgment: Judgment normal.    CBC    Component Value Date/Time   WBC 7.9 10/10/2021 0214   RBC 3.70 (L) 10/10/2021 0214   HGB 11.8 (L) 10/10/2021 0214   HCT 34.8 (L) 10/10/2021 0214   PLT 224 10/10/2021 0214   MCV 94.1 10/10/2021 0214   MCH 31.9 10/10/2021 0214   MCHC 33.9 10/10/2021 0214   RDW 12.8 10/10/2021 0214   LYMPHSABS 2.1 05/04/2020 1523   MONOABS 0.6 05/04/2020 1523   EOSABS 0.2 05/04/2020 1523   BASOSABS 0.1 05/04/2020 1523      Latest Ref Rng & Units 10/10/2021   12:21 PM 10/09/2021    1:30 AM 10/08/2021   12:53 PM  BMP  Glucose 70 - 99 mg/dL 152  106  109   BUN 8 - 23 mg/dL _0 Creatinine 0.44 - 1.00 mg/dL 1.60  1.56  1.73   Sodium 135 - 145 mmol/L 136  137  139   Potassium 3.5 - 5.1 mmol/L 4.2  3.8  4.2   Chloride 98 - 111 mmol/L 106  105  103   CO2 22 - 32 mmol/L _1 Calcium 8.9 - 10.3 mg/dL 8.9  8.1  9.2    Chest imaging: CTA Chest 10/08/21 Cardiovascular: There is notable peripheral thrombus in the left pulmonary artery superiorly for example on image 112 of series 9. Suspected associated occlusion of the posterior segmental  branch of the left upper lobe pulmonary artery, with associated left apical airspace opacity on image 10 of series 11 which could be from pulmonary hemorrhage or infarct.   Right ventricular to left ventricular ratio is 1.6.   Coronary, aortic arch, and branch vessel atherosclerotic vascular disease. Dense mitral valve calcifications.   Mediastinum/Nodes: Small type 1 hiatal hernia.   Lungs/Pleura: Small left pleural effusion. Patchy airspace opacity posteriorly in the left upper lobe, possibly pulmonary infarct or hemorrhage given the pulmonary arterial findings.   There narrowed segments of the tracheobronchial tree, raising the possibility of tracheobronchomalacia.  PFT:     No data to display          Labs:  Path:  Echo 10/08/21: LV EF 60-65%. Grade I diastolic dysfunction. RV systolic function and size is normal.   Heart Catheterization:  Assessment & Plan:   Other acute pulmonary embolism without acute cor pulmonale (HCC) - Plan: CBC w/Diff, Comp Met (CMET), Lipase, Urinalysis, Complete, CANCELED: Urinalysis, Complete  Nausea and vomiting, unspecified vomiting type - Plan: DG Chest 2 View, DG Abd 1 View, CANCELED: CBC with Differential, CANCELED: Comp Met (CMET), CANCELED: Lipase, CANCELED: Urinalysis, Complete, CANCELED: TSH, CANCELED: Urinalysis, Complete, CANCELED: Lipase, CANCELED: Comp Met (CMET), CANCELED: CBC with Differential  Discussion: Yesenia [name] is a 88 year old woman, former smoker with history of DVT, hypertension, CKD IIIb and DMII who is referred to pulmonary clinic for hospital follow up  of pulmonary embolism.   She has been having issues with nausea and vomiting since her hospitalization. We will check chest x-ray and KUB today. We will also check labs and urinalysis for further evaluation. Less likely side effect of eliquis.   She is to contiue eliquis for at least 3 months of treatment and then we will consider placement of IVC filter if her  risks of anticoagulation outweigh benefits.  We will call patient with the above workup results.  Follow up in 3 months.  Freda Jackson, MD [facility] Office: [phone]   Current Outpatient Medications:    acetaminophen (TYLENOL) 500 MG tablet, Take 500 mg by mouth every 6 (six) hours as needed for mild pain., Disp: , Rfl:    APIXABAN (ELIQUIS) VTE STARTER PACK (10MG AND 5MG), Take as directed on package: start with two-33m tablets twice daily for 7 days. On day 8, switch to one-580mtablet twice daily., Disp: 1 each, Rfl: 0   latanoprost (XALATAN) 0.005 % ophthalmic solution, Place 1 drop into both eyes at bedtime., Disp: , Rfl:    levothyroxine (SYNTHROID, LEVOTHROID) 50 MCG tablet, TAKE 1 TABLET EVERY DAY (Patient taking differently: Take 50 mcg by mouth daily before breakfast.), Disp: 90 tablet, Rfl: 1   losartan (COZAAR) 50 MG tablet, Take 50 mg by mouth daily., Disp: , Rfl:    pantoprazole (PROTONIX) 40 MG tablet, TAKE ONE TABLET BY MOUTH ONCE DAILY BEFORE  BREAKFAST (Patient taking differently: Take 40 mg by mouth daily.), Disp: 60 tablet, Rfl: 0

## 2021-11-10 NOTE — Patient Instructions (Addendum)
We will check a Chest x-ray and x-ray of your abdomen for the nausea and vomiting. We will also check labs today along with a urine analysis.   Once we have the results we will give you a call about re-starting your fluid pill.  Recommend following up with your PCP in near future.  Follow up in 3 months

## 2021-11-10 NOTE — Addendum Note (Signed)
Addended by: Priscille Kluver on: 11/10/2021 10:46 AM   Modules accepted: Orders

## 2021-11-11 LAB — URINALYSIS, COMPLETE
Bilirubin Urine: NEGATIVE
Glucose, UA: NEGATIVE
Ketones, ur: NEGATIVE
Nitrite: NEGATIVE
Specific Gravity, Urine: 1.019 (ref 1.001–1.035)
pH: 6 (ref 5.0–8.0)

## 2021-11-25 DIAGNOSIS — I2699 Other pulmonary embolism without acute cor pulmonale: Secondary | ICD-10-CM | POA: Diagnosis not present

## 2021-11-25 DIAGNOSIS — R944 Abnormal results of kidney function studies: Secondary | ICD-10-CM | POA: Diagnosis not present

## 2021-11-25 DIAGNOSIS — Z23 Encounter for immunization: Secondary | ICD-10-CM | POA: Diagnosis not present

## 2021-11-25 DIAGNOSIS — Z09 Encounter for follow-up examination after completed treatment for conditions other than malignant neoplasm: Secondary | ICD-10-CM | POA: Diagnosis not present

## 2021-11-25 DIAGNOSIS — R110 Nausea: Secondary | ICD-10-CM | POA: Diagnosis not present

## 2021-11-25 DIAGNOSIS — I10 Essential (primary) hypertension: Secondary | ICD-10-CM | POA: Diagnosis not present

## 2021-12-14 DIAGNOSIS — N184 Chronic kidney disease, stage 4 (severe): Secondary | ICD-10-CM | POA: Diagnosis not present

## 2021-12-14 DIAGNOSIS — I82402 Acute embolism and thrombosis of unspecified deep veins of left lower extremity: Secondary | ICD-10-CM | POA: Diagnosis not present

## 2021-12-14 DIAGNOSIS — I129 Hypertensive chronic kidney disease with stage 1 through stage 4 chronic kidney disease, or unspecified chronic kidney disease: Secondary | ICD-10-CM | POA: Diagnosis not present

## 2021-12-14 DIAGNOSIS — K219 Gastro-esophageal reflux disease without esophagitis: Secondary | ICD-10-CM | POA: Diagnosis not present

## 2021-12-21 DIAGNOSIS — H401131 Primary open-angle glaucoma, bilateral, mild stage: Secondary | ICD-10-CM | POA: Diagnosis not present

## 2021-12-21 DIAGNOSIS — H353131 Nonexudative age-related macular degeneration, bilateral, early dry stage: Secondary | ICD-10-CM | POA: Diagnosis not present

## 2022-01-12 DIAGNOSIS — N390 Urinary tract infection, site not specified: Secondary | ICD-10-CM | POA: Diagnosis not present

## 2022-01-26 DIAGNOSIS — I10 Essential (primary) hypertension: Secondary | ICD-10-CM | POA: Diagnosis not present

## 2022-01-26 DIAGNOSIS — R197 Diarrhea, unspecified: Secondary | ICD-10-CM | POA: Diagnosis not present

## 2022-01-26 DIAGNOSIS — K30 Functional dyspepsia: Secondary | ICD-10-CM | POA: Diagnosis not present

## 2022-01-26 DIAGNOSIS — R253 Fasciculation: Secondary | ICD-10-CM | POA: Diagnosis not present

## 2022-05-24 DIAGNOSIS — E7800 Pure hypercholesterolemia, unspecified: Secondary | ICD-10-CM | POA: Diagnosis not present

## 2022-05-24 DIAGNOSIS — N184 Chronic kidney disease, stage 4 (severe): Secondary | ICD-10-CM | POA: Diagnosis not present

## 2022-05-24 DIAGNOSIS — I10 Essential (primary) hypertension: Secondary | ICD-10-CM | POA: Diagnosis not present

## 2022-05-24 DIAGNOSIS — E039 Hypothyroidism, unspecified: Secondary | ICD-10-CM | POA: Diagnosis not present

## 2022-05-30 DIAGNOSIS — Z0000 Encounter for general adult medical examination without abnormal findings: Secondary | ICD-10-CM | POA: Diagnosis not present

## 2022-05-30 DIAGNOSIS — K219 Gastro-esophageal reflux disease without esophagitis: Secondary | ICD-10-CM | POA: Diagnosis not present

## 2022-05-30 DIAGNOSIS — L720 Epidermal cyst: Secondary | ICD-10-CM | POA: Diagnosis not present

## 2022-05-30 DIAGNOSIS — N2581 Secondary hyperparathyroidism of renal origin: Secondary | ICD-10-CM | POA: Diagnosis not present

## 2022-05-30 DIAGNOSIS — I10 Essential (primary) hypertension: Secondary | ICD-10-CM | POA: Diagnosis not present

## 2022-05-30 DIAGNOSIS — E039 Hypothyroidism, unspecified: Secondary | ICD-10-CM | POA: Diagnosis not present

## 2022-05-30 DIAGNOSIS — N184 Chronic kidney disease, stage 4 (severe): Secondary | ICD-10-CM | POA: Diagnosis not present

## 2022-06-20 DIAGNOSIS — I129 Hypertensive chronic kidney disease with stage 1 through stage 4 chronic kidney disease, or unspecified chronic kidney disease: Secondary | ICD-10-CM | POA: Diagnosis not present

## 2022-06-20 DIAGNOSIS — I82402 Acute embolism and thrombosis of unspecified deep veins of left lower extremity: Secondary | ICD-10-CM | POA: Diagnosis not present

## 2022-06-20 DIAGNOSIS — N184 Chronic kidney disease, stage 4 (severe): Secondary | ICD-10-CM | POA: Diagnosis not present

## 2022-06-20 DIAGNOSIS — K219 Gastro-esophageal reflux disease without esophagitis: Secondary | ICD-10-CM | POA: Diagnosis not present

## 2022-11-29 DIAGNOSIS — I129 Hypertensive chronic kidney disease with stage 1 through stage 4 chronic kidney disease, or unspecified chronic kidney disease: Secondary | ICD-10-CM | POA: Diagnosis not present

## 2022-11-29 DIAGNOSIS — N184 Chronic kidney disease, stage 4 (severe): Secondary | ICD-10-CM | POA: Diagnosis not present

## 2022-11-29 DIAGNOSIS — I82402 Acute embolism and thrombosis of unspecified deep veins of left lower extremity: Secondary | ICD-10-CM | POA: Diagnosis not present

## 2022-11-29 DIAGNOSIS — K219 Gastro-esophageal reflux disease without esophagitis: Secondary | ICD-10-CM | POA: Diagnosis not present

## 2022-11-29 DIAGNOSIS — R300 Dysuria: Secondary | ICD-10-CM | POA: Diagnosis not present

## 2022-12-06 DIAGNOSIS — R413 Other amnesia: Secondary | ICD-10-CM | POA: Diagnosis not present

## 2022-12-06 DIAGNOSIS — I10 Essential (primary) hypertension: Secondary | ICD-10-CM | POA: Diagnosis not present

## 2022-12-06 DIAGNOSIS — Z23 Encounter for immunization: Secondary | ICD-10-CM | POA: Diagnosis not present

## 2022-12-06 DIAGNOSIS — N184 Chronic kidney disease, stage 4 (severe): Secondary | ICD-10-CM | POA: Diagnosis not present

## 2022-12-06 DIAGNOSIS — K219 Gastro-esophageal reflux disease without esophagitis: Secondary | ICD-10-CM | POA: Diagnosis not present

## 2022-12-06 DIAGNOSIS — Z9989 Dependence on other enabling machines and devices: Secondary | ICD-10-CM | POA: Diagnosis not present

## 2022-12-22 DIAGNOSIS — H353131 Nonexudative age-related macular degeneration, bilateral, early dry stage: Secondary | ICD-10-CM | POA: Diagnosis not present

## 2023-03-28 ENCOUNTER — Encounter: Payer: Self-pay | Admitting: Emergency Medicine

## 2023-03-28 ENCOUNTER — Emergency Department: Payer: Medicare Other

## 2023-03-28 ENCOUNTER — Observation Stay
Admission: EM | Admit: 2023-03-28 | Discharge: 2023-03-31 | Disposition: A | Discharge status: Home / Self Care | Payer: Medicare Other | Attending: Hospitalist | Admitting: Hospitalist

## 2023-03-28 ENCOUNTER — Other Ambulatory Visit: Payer: Self-pay

## 2023-03-28 DIAGNOSIS — Z7901 Long term (current) use of anticoagulants: Secondary | ICD-10-CM | POA: Insufficient documentation

## 2023-03-28 DIAGNOSIS — R002 Palpitations: Secondary | ICD-10-CM | POA: Insufficient documentation

## 2023-03-28 DIAGNOSIS — Z87891 Personal history of nicotine dependence: Secondary | ICD-10-CM | POA: Diagnosis not present

## 2023-03-28 DIAGNOSIS — K219 Gastro-esophageal reflux disease without esophagitis: Secondary | ICD-10-CM | POA: Insufficient documentation

## 2023-03-28 DIAGNOSIS — Z86711 Personal history of pulmonary embolism: Secondary | ICD-10-CM | POA: Insufficient documentation

## 2023-03-28 DIAGNOSIS — I700 Atherosclerosis of aorta: Secondary | ICD-10-CM | POA: Diagnosis not present

## 2023-03-28 DIAGNOSIS — N189 Chronic kidney disease, unspecified: Secondary | ICD-10-CM

## 2023-03-28 DIAGNOSIS — R7989 Other specified abnormal findings of blood chemistry: Secondary | ICD-10-CM

## 2023-03-28 DIAGNOSIS — U071 COVID-19: Secondary | ICD-10-CM | POA: Diagnosis not present

## 2023-03-28 DIAGNOSIS — R778 Other specified abnormalities of plasma proteins: Secondary | ICD-10-CM | POA: Insufficient documentation

## 2023-03-28 DIAGNOSIS — I10 Essential (primary) hypertension: Secondary | ICD-10-CM | POA: Insufficient documentation

## 2023-03-28 DIAGNOSIS — N179 Acute kidney failure, unspecified: Secondary | ICD-10-CM

## 2023-03-28 DIAGNOSIS — Z79899 Other long term (current) drug therapy: Secondary | ICD-10-CM | POA: Diagnosis not present

## 2023-03-28 DIAGNOSIS — E785 Hyperlipidemia, unspecified: Secondary | ICD-10-CM | POA: Diagnosis not present

## 2023-03-28 DIAGNOSIS — D649 Anemia, unspecified: Secondary | ICD-10-CM | POA: Diagnosis not present

## 2023-03-28 DIAGNOSIS — R079 Chest pain, unspecified: Secondary | ICD-10-CM

## 2023-03-28 DIAGNOSIS — R0789 Other chest pain: Secondary | ICD-10-CM | POA: Diagnosis not present

## 2023-03-28 DIAGNOSIS — I499 Cardiac arrhythmia, unspecified: Secondary | ICD-10-CM | POA: Diagnosis not present

## 2023-03-28 DIAGNOSIS — E039 Hypothyroidism, unspecified: Secondary | ICD-10-CM | POA: Diagnosis not present

## 2023-03-28 DIAGNOSIS — Z743 Need for continuous supervision: Secondary | ICD-10-CM | POA: Diagnosis not present

## 2023-03-28 DIAGNOSIS — E119 Type 2 diabetes mellitus without complications: Secondary | ICD-10-CM | POA: Diagnosis not present

## 2023-03-28 DIAGNOSIS — I4510 Unspecified right bundle-branch block: Secondary | ICD-10-CM | POA: Diagnosis not present

## 2023-03-28 DIAGNOSIS — R6889 Other general symptoms and signs: Secondary | ICD-10-CM | POA: Diagnosis not present

## 2023-03-28 LAB — CBC WITH DIFFERENTIAL/PLATELET
Abs Immature Granulocytes: 0.01 10*3/uL (ref 0.00–0.07)
Basophils Absolute: 0 10*3/uL (ref 0.0–0.1)
Basophils Relative: 1 %
Eosinophils Absolute: 0.2 10*3/uL (ref 0.0–0.5)
Eosinophils Relative: 3 %
HCT: 42.2 % (ref 36.0–46.0)
Hemoglobin: 14.1 g/dL (ref 12.0–15.0)
Immature Granulocytes: 0 %
Lymphocytes Relative: 34 %
Lymphs Abs: 1.9 10*3/uL (ref 0.7–4.0)
MCH: 32.4 pg (ref 26.0–34.0)
MCHC: 33.4 g/dL (ref 30.0–36.0)
MCV: 97 fL (ref 80.0–100.0)
Monocytes Absolute: 0.5 10*3/uL (ref 0.1–1.0)
Monocytes Relative: 9 %
Neutro Abs: 3.1 10*3/uL (ref 1.7–7.7)
Neutrophils Relative %: 53 %
Platelets: 235 10*3/uL (ref 150–400)
RBC: 4.35 MIL/uL (ref 3.87–5.11)
RDW: 12.4 % (ref 11.5–15.5)
WBC: 5.7 10*3/uL (ref 4.0–10.5)
nRBC: 0 % (ref 0.0–0.2)

## 2023-03-28 NOTE — ED Triage Notes (Addendum)
 Pt presents via [facility] from home for chest discomfort that she describes as feeling like indigestion or palpitations. Pt reports discomfort was present for approx 2 hours before calling EMS.  Norovirus last week with no return of appetite.   EMS 12 lead shows RBBB.

## 2023-03-28 NOTE — ED Provider Notes (Addendum)
 [facility] Provider Note    Event Date/Time   First MD Initiated Contact with Patient 03/28/23 2323     (approximate)   History   Chest Pain   HPI  Yesenia [name] is a 89 y.o. female   Past medical history of PE on Eliquis , hypothyroid, hypertension and hyperlipidemia, diabetic, here with chest pain.  Brought in by EMS.  She has not been feeling well over the last 2 weeks.  She has been suffering from a GI illness nausea vomiting diarrhea that has mostly resolved at this time.  She has been dealing with family stress and during this time she has been feeling discomfort in her chest, fluttering sensation.  She denies any shortness of breath, cough, respiratory infection symptoms.  No GI or GU symptoms currently.  Has been fully compliant with her Eliquis .  Independent Historian contributed to assessment above: Her son and daughter at bedside to corroborate information past medical history as above       Physical Exam   Triage Vital Signs: ED Triage Vitals  Encounter Vitals Group     BP      Systolic BP Percentile      Diastolic BP Percentile      Pulse      Resp      Temp      Temp src      SpO2      Weight      Height      Head Circumference      Peak Flow      Pain Score      Pain Loc      Pain Education      Exclude from Growth Chart     Most recent vital signs: Vitals:   03/29/23 0124  BP: (!) 140/51  Pulse: 61  Resp: (!) 22  Temp: 98.4 F (36.9 C)  SpO2: 98%    General: Awake, no distress.  CV:  Good peripheral perfusion.  Resp:  Normal effort.  Abd:  No distention.  Other:  Hard of hearing.  No acute distress.  Pleasant awake alert oriented.  Lung sounds clear to auscultation bilateral without focality or wheezing.  Skin appears warm well-perfused.  Radial pulses intact and equal bilaterally.  Moving all extremities.   ED Results / Procedures / Treatments   Labs (all labs ordered are listed, but only abnormal  results are displayed) Labs Reviewed  RESP PANEL BY RT-PCR (RSV, FLU A&B, COVID)  RVPGX2 - Abnormal; Notable for the following components:      Result Value   SARS Coronavirus 2 by RT PCR POSITIVE (*)    All other components within normal limits  COMPREHENSIVE METABOLIC PANEL - Abnormal; Notable for the following components:   Glucose, Bld 101 (*)    BUN 28 (*)    Creatinine, Ser 1.61 (*)    Calcium  8.8 (*)    GFR, Estimated 30 (*)    All other components within normal limits  LIPASE, BLOOD - Abnormal; Notable for the following components:   Lipase 81 (*)    All other components within normal limits  BRAIN NATRIURETIC PEPTIDE - Abnormal; Notable for the following components:   B Natriuretic Peptide 246.3 (*)    All other components within normal limits  TROPONIN I (HIGH SENSITIVITY) - Abnormal; Notable for the following components:   Troponin I (High Sensitivity) 29 (*)    All other components within normal limits  TROPONIN I (HIGH  SENSITIVITY) - Abnormal; Notable for the following components:   Troponin I (High Sensitivity) 40 (*)    All other components within normal limits  CBC WITH DIFFERENTIAL/PLATELET  TSH  T4, FREE  MAGNESIUM   APTT  HEPARIN  LEVEL (UNFRACTIONATED)  PROTIME-INR     I ordered and reviewed the above labs they are notable for COVID-positive.  Initial troponin is 29.  EKG  ED ECG REPORT I, Ginnie Shams, the attending physician, personally viewed and interpreted this ECG.   Date: 03/28/2023  EKG Time: 2336  Rate: 72  Rhythm: sinus  Intervals:rbbb  ST&T Change:  no stemi    RADIOLOGY I independently reviewed and interpreted chest x-ray and see no obvious opacities or pneumothorax I also reviewed radiologist's formal read.   PROCEDURES:  Critical Care performed:  yes  .Critical Care  Performed by: Shams Ginnie, MD Authorized by: Shams Ginnie, MD   Critical care provider statement:    Critical care time (minutes):  30   Critical care was time  spent personally by me on the following activities:  Development of treatment plan with patient or surrogate, discussions with consultants, evaluation of patient's response to treatment, examination of patient, ordering and review of laboratory studies, ordering and review of radiographic studies, ordering and performing treatments and interventions, pulse oximetry, re-evaluation of patient's condition and review of old charts    MEDICATIONS ORDERED IN ED: Medications  heparin  ADULT infusion 100 units/mL (25000 units/250mL) (has no administration in time range)  aspirin  chewable tablet 324 mg (324 mg Oral Given 03/29/23 0310)  sodium chloride  0.9 % bolus 1,000 mL (1,000 mLs Intravenous Bolus 03/29/23 0315)    External physician / consultants:  I spoke with hospital medicine for admission and regarding care plan for this patient.   IMPRESSION / MDM / ASSESSMENT AND PLAN / ED COURSE  I reviewed the triage vital signs and the nursing notes.                                Patient's presentation is most consistent with acute presentation with potential threat to life or bodily function.  Differential diagnosis includes, but is not limited to, dysrhythmia, electrolyte disturbance, ACS, PE, respiratory infection   The patient is on the cardiac monitor to evaluate for evidence of arrhythmia and/or significant heart rate changes.  MDM:     Patient with a chief complaint of fluttering sensation of the chest in the setting of stress and recent resolved GI illness.  Overall benign physical exam and well-appearing for her age.  Check electrolytes in case in the setting of GI illness that has become deranged causing dysrhythmias. Check viral testing. Check EKG, serial troponins for ACS. Doubt PE given full compliance with Eliquis .  Found to be COVID-positive.  Initial troponin mildly elevated will check for serial troponin.  Patient stable.  Troponin increasing, likely demand in the setting of  COVID infection which likely explains her GI symptoms earlier.  She is already on Eliquis  for history of VTE, will start heparin  for NSTEMI, give fluids, admission.           FINAL CLINICAL IMPRESSION(S) / ED DIAGNOSES   Final diagnoses:  COVID  Palpitations  Nonspecific chest pain  Elevated troponin     Rx / DC Orders   ED Discharge Orders     None        Note:  This document was prepared using Dragon voice recognition software  and may include unintentional dictation errors.    Cyrena Mylar, MD 03/29/23 9692    Cyrena Mylar, MD 03/29/23 0330

## 2023-03-29 DIAGNOSIS — N179 Acute kidney failure, unspecified: Secondary | ICD-10-CM | POA: Diagnosis not present

## 2023-03-29 DIAGNOSIS — N189 Chronic kidney disease, unspecified: Secondary | ICD-10-CM

## 2023-03-29 DIAGNOSIS — U071 COVID-19: Secondary | ICD-10-CM | POA: Diagnosis not present

## 2023-03-29 DIAGNOSIS — R7989 Other specified abnormal findings of blood chemistry: Secondary | ICD-10-CM | POA: Diagnosis not present

## 2023-03-29 DIAGNOSIS — I10 Essential (primary) hypertension: Secondary | ICD-10-CM | POA: Insufficient documentation

## 2023-03-29 DIAGNOSIS — Z86711 Personal history of pulmonary embolism: Secondary | ICD-10-CM | POA: Insufficient documentation

## 2023-03-29 DIAGNOSIS — K219 Gastro-esophageal reflux disease without esophagitis: Secondary | ICD-10-CM | POA: Insufficient documentation

## 2023-03-29 DIAGNOSIS — R079 Chest pain, unspecified: Secondary | ICD-10-CM | POA: Diagnosis not present

## 2023-03-29 LAB — MAGNESIUM: Magnesium: 1.7 mg/dL (ref 1.7–2.4)

## 2023-03-29 LAB — T4, FREE: Free T4: 0.99 ng/dL (ref 0.61–1.12)

## 2023-03-29 LAB — BASIC METABOLIC PANEL
Anion gap: 14 (ref 5–15)
BUN: 26 mg/dL — ABNORMAL HIGH (ref 8–23)
CO2: 23 mmol/L (ref 22–32)
Calcium: 8.3 mg/dL — ABNORMAL LOW (ref 8.9–10.3)
Chloride: 103 mmol/L (ref 98–111)
Creatinine, Ser: 1.58 mg/dL — ABNORMAL HIGH (ref 0.44–1.00)
GFR, Estimated: 31 mL/min — ABNORMAL LOW (ref >60)
Glucose, Bld: 106 mg/dL — ABNORMAL HIGH (ref 70–99)
Potassium: 3.5 mmol/L (ref 3.5–5.1)
Sodium: 140 mmol/L (ref 135–145)

## 2023-03-29 LAB — COMPREHENSIVE METABOLIC PANEL
ALT: 14 U/L (ref 0–44)
AST: 22 U/L (ref 15–41)
Albumin: 3.5 g/dL (ref 3.5–5.0)
Alkaline Phosphatase: 62 U/L (ref 38–126)
Anion gap: 12 (ref 5–15)
BUN: 28 mg/dL — ABNORMAL HIGH (ref 8–23)
CO2: 22 mmol/L (ref 22–32)
Calcium: 8.8 mg/dL — ABNORMAL LOW (ref 8.9–10.3)
Chloride: 104 mmol/L (ref 98–111)
Creatinine, Ser: 1.61 mg/dL — ABNORMAL HIGH (ref 0.44–1.00)
GFR, Estimated: 30 mL/min — ABNORMAL LOW (ref >60)
Glucose, Bld: 101 mg/dL — ABNORMAL HIGH (ref 70–99)
Potassium: 4 mmol/L (ref 3.5–5.1)
Sodium: 138 mmol/L (ref 135–145)
Total Bilirubin: 0.7 mg/dL (ref 0.0–1.2)
Total Protein: 6.8 g/dL (ref 6.5–8.1)

## 2023-03-29 LAB — TROPONIN I (HIGH SENSITIVITY)
Troponin I (High Sensitivity): 29 ng/L — ABNORMAL HIGH (ref <18)
Troponin I (High Sensitivity): 40 ng/L — ABNORMAL HIGH (ref <18)
Troponin I (High Sensitivity): 44 ng/L — ABNORMAL HIGH (ref <18)

## 2023-03-29 LAB — CBC
HCT: 39.9 % (ref 36.0–46.0)
Hemoglobin: 13.2 g/dL (ref 12.0–15.0)
MCH: 32.1 pg (ref 26.0–34.0)
MCHC: 33.1 g/dL (ref 30.0–36.0)
MCV: 97.1 fL (ref 80.0–100.0)
Platelets: 244 10*3/uL (ref 150–400)
RBC: 4.11 MIL/uL (ref 3.87–5.11)
RDW: 12.3 % (ref 11.5–15.5)
WBC: 6.1 10*3/uL (ref 4.0–10.5)
nRBC: 0 % (ref 0.0–0.2)

## 2023-03-29 LAB — RESP PANEL BY RT-PCR (RSV, FLU A&B, COVID)  RVPGX2
Influenza A by PCR: NEGATIVE
Influenza B by PCR: NEGATIVE
Resp Syncytial Virus by PCR: NEGATIVE
SARS Coronavirus 2 by RT PCR: POSITIVE — AB

## 2023-03-29 LAB — TSH: TSH: 1.739 u[IU]/mL (ref 0.350–4.500)

## 2023-03-29 LAB — LACTATE DEHYDROGENASE: LDH: 166 U/L (ref 98–192)

## 2023-03-29 LAB — D-DIMER, QUANTITATIVE: D-Dimer, Quant: 1.29 ug{FEU}/mL — ABNORMAL HIGH (ref 0.00–0.50)

## 2023-03-29 LAB — C-REACTIVE PROTEIN: CRP: <0.5 mg/dL (ref <1.0)

## 2023-03-29 LAB — LIPASE, BLOOD: Lipase: 81 U/L — ABNORMAL HIGH (ref 11–51)

## 2023-03-29 LAB — PROTIME-INR
INR: 1.4 — ABNORMAL HIGH (ref 0.8–1.2)
Prothrombin Time: 16.9 s — ABNORMAL HIGH (ref 11.4–15.2)

## 2023-03-29 LAB — FERRITIN: Ferritin: 50 ng/mL (ref 11–307)

## 2023-03-29 LAB — APTT
aPTT: 37 s — ABNORMAL HIGH (ref 24–36)
aPTT: 93 s — ABNORMAL HIGH (ref 24–36)

## 2023-03-29 LAB — BRAIN NATRIURETIC PEPTIDE: B Natriuretic Peptide: 246.3 pg/mL — ABNORMAL HIGH (ref 0.0–100.0)

## 2023-03-29 LAB — HEPARIN LEVEL (UNFRACTIONATED): Heparin Unfractionated: >1.1 [IU]/mL — ABNORMAL HIGH (ref 0.30–0.70)

## 2023-03-29 MED ORDER — LATANOPROST 0.005 % OP SOLN
1.0000 [drp] | Freq: Every day | OPHTHALMIC | Status: DC
  Administered 2023-03-29 – 2023-03-30 (×2): 1 [drp] via OPHTHALMIC
  Filled 2023-03-29: qty 2.5

## 2023-03-29 MED ORDER — ACETAMINOPHEN 325 MG PO TABS: 650.0000 mg | ORAL_TABLET | Freq: Four times a day (QID) | ORAL | Status: DC | PRN

## 2023-03-29 MED ORDER — ACETAMINOPHEN 650 MG RE SUPP: 650.0000 mg | Freq: Four times a day (QID) | RECTAL | Status: DC | PRN

## 2023-03-29 MED ORDER — ZINC SULFATE 220 (50 ZN) MG PO CAPS
220.0000 mg | ORAL_CAPSULE | Freq: Every day | ORAL | Status: DC
  Administered 2023-03-29 – 2023-03-31 (×3): 220 mg via ORAL
  Filled 2023-03-29 (×3): qty 1

## 2023-03-29 MED ORDER — MORPHINE SULFATE (PF) 2 MG/ML IV SOLN: 2.0000 mg | INTRAVENOUS | Status: DC | PRN

## 2023-03-29 MED ORDER — APIXABAN 2.5 MG PO TABS
2.5000 mg | ORAL_TABLET | Freq: Two times a day (BID) | ORAL | Status: DC
  Administered 2023-03-29 – 2023-03-31 (×4): 2.5 mg via ORAL
  Filled 2023-03-29 (×4): qty 1

## 2023-03-29 MED ORDER — ASPIRIN 325 MG PO TBEC
325.0000 mg | DELAYED_RELEASE_TABLET | Freq: Every day | ORAL | Status: DC
  Administered 2023-03-29 – 2023-03-31 (×3): 325 mg via ORAL
  Filled 2023-03-29 (×3): qty 1

## 2023-03-29 MED ORDER — TRAZODONE HCL 50 MG PO TABS: 25.0000 mg | ORAL_TABLET | Freq: Every evening | ORAL | Status: DC | PRN

## 2023-03-29 MED ORDER — LOPERAMIDE HCL 2 MG PO CAPS: 2.0000 mg | ORAL_CAPSULE | ORAL | Status: DC | PRN

## 2023-03-29 MED ORDER — MAGNESIUM HYDROXIDE 400 MG/5ML PO SUSP: 30.0000 mL | Freq: Every day | ORAL | Status: DC | PRN

## 2023-03-29 MED ORDER — VITAMIN C 500 MG PO TABS
1000.0000 mg | ORAL_TABLET | Freq: Every day | ORAL | Status: DC
  Administered 2023-03-29 – 2023-03-31 (×3): 1000 mg via ORAL
  Filled 2023-03-29 (×3): qty 2

## 2023-03-29 MED ORDER — LEVOTHYROXINE SODIUM 50 MCG PO TABS
50.0000 ug | ORAL_TABLET | Freq: Every day | ORAL | Status: DC
  Administered 2023-03-29 – 2023-03-31 (×3): 50 ug via ORAL
  Filled 2023-03-29 (×3): qty 1

## 2023-03-29 MED ORDER — PANTOPRAZOLE SODIUM 40 MG PO TBEC
40.0000 mg | DELAYED_RELEASE_TABLET | Freq: Every day | ORAL | Status: DC
  Administered 2023-03-29 – 2023-03-31 (×3): 40 mg via ORAL
  Filled 2023-03-29 (×3): qty 1

## 2023-03-29 MED ORDER — ASPIRIN 81 MG PO CHEW
324.0000 mg | CHEWABLE_TABLET | Freq: Once | ORAL | Status: AC
  Administered 2023-03-29: 324 mg via ORAL
  Filled 2023-03-29: qty 4

## 2023-03-29 MED ORDER — SODIUM CHLORIDE 0.9 % IV BOLUS
1000.0000 mL | Freq: Once | INTRAVENOUS | Status: AC
  Administered 2023-03-29: 1000 mL via INTRAVENOUS

## 2023-03-29 MED ORDER — NITROGLYCERIN 0.4 MG SL SUBL: 0.4000 mg | SUBLINGUAL_TABLET | SUBLINGUAL | Status: DC | PRN

## 2023-03-29 MED ORDER — HYDRALAZINE HCL 20 MG/ML IJ SOLN
10.0000 mg | Freq: Four times a day (QID) | INTRAMUSCULAR | Status: DC | PRN
  Administered 2023-03-29: 10 mg via INTRAVENOUS
  Filled 2023-03-29: qty 1

## 2023-03-29 MED ORDER — SODIUM CHLORIDE 0.9 % IV SOLN: INTRAVENOUS | Status: DC

## 2023-03-29 MED ORDER — LOSARTAN POTASSIUM 50 MG PO TABS
50.0000 mg | ORAL_TABLET | Freq: Every day | ORAL | Status: DC
  Administered 2023-03-29 – 2023-03-31 (×3): 50 mg via ORAL
  Filled 2023-03-29 (×3): qty 1

## 2023-03-29 MED ORDER — HEPARIN (PORCINE) 25000 UT/250ML-% IV SOLN
800.0000 [IU]/h | INTRAVENOUS | Status: DC
  Administered 2023-03-29: 800 [IU]/h via INTRAVENOUS
  Filled 2023-03-29: qty 250

## 2023-03-29 NOTE — ED Notes (Signed)
Pt sleeping at this time. Family at bedside

## 2023-03-29 NOTE — Progress Notes (Signed)
 PHARMACY - ANTICOAGULATION CONSULT NOTE  Pharmacy Consult for Apixaban   Indication:  Hx recurrent DVT  Labs: Recent Labs    03/28/23 2339 03/29/23 0128 03/29/23 0346 03/29/23 0432 03/29/23 1449  HGB 14.1  --   --  13.2  --   HCT 42.2  --   --  39.9  --   PLT 235  --   --  244  --   APTT  --   --  37*  --  93*  LABPROT  --   --  16.9*  --   --   INR  --   --  1.4*  --   --   HEPARINUNFRC  --   --  >1.10*  --   --   CREATININE 1.61*  --   --  1.58*  --   TROPONINIHS 29* 40*  --   --  44*    CrCl cannot be calculated (Unknown ideal weight.).   Medical History: Past Medical History:  Diagnosis Date   Anemia, unspecified    Breast cyst    Chest pain, unspecified    Depression    Diarrhea of presumed infectious origin    Diffuse cystic mastopathy    Diverticulosis 06-2009   mild- Colonoscopy    DJD (degenerative joint disease)    Dysmetabolic syndrome X    Edema    Esophageal reflux    Esophageal stricture    Gout, unspecified    Hiatal hernia    History of gallstones    History of Helicobacter pylori infection    HLD (hyperlipidemia)    HTN (hypertension)    Hypertrophied anal papilla    Hypopotassemia    Hypothyroidism    Iron deficiency anemia, unspecified    Lung nodule    Osteoarthritis    Pneumonia 1993   Solitary cyst of breast    Type II or unspecified type diabetes mellitus without mention of complication, not stated as uncontrolled    pt. states not a diabetic   Unspecified sinusitis (chronic)    Vitamin B12 deficiency    Medications:  (Not in a hospital admission)  Assessment: Pharmacy consulted to re-start patient's home apixaban  for history of recurrent DVT. Per chart review, patient is currently taking apixaban  2.5 mg BID.   Plan:  --Resume apixaban  2.5 mg BID --CBC per protocol while inpatient  Thank you for involving pharmacy in this patient's care.   Page Boast 03/29/2023 5:03 PM

## 2023-03-29 NOTE — Progress Notes (Signed)
 PHARMACY - ANTICOAGULATION CONSULT NOTE  Pharmacy Consult for Heparin   Indication: chest pain/ACS  Allergies  Allergen Reactions   Cephalexin     REACTION: questionable   Codeine Nausea And Vomiting   Gabapentin      Headache   Sulfamethoxazole-Trimethoprim     REACTION: unspecified   Sulfonamide Derivatives    Vicodin [Hydrocodone-Acetaminophen ] Other (See Comments)    MAKES HER CRAZY     Patient Measurements:   Heparin  Dosing Weight: 66 kg   Vital Signs: Temp: 98.4 F (36.9 C) (01/15 0124) Temp Source: Axillary (01/15 0124) BP: 140/51 (01/15 0124) Pulse Rate: 61 (01/15 0124)  Labs: Recent Labs    03/28/23 2339 03/29/23 0128  HGB 14.1  --   HCT 42.2  --   PLT 235  --   CREATININE 1.61*  --   TROPONINIHS 29* 40*    CrCl cannot be calculated (Unknown ideal weight.).   Medical History: Past Medical History:  Diagnosis Date   Anemia, unspecified    Breast cyst    Chest pain, unspecified    Depression    Diarrhea of presumed infectious origin    Diffuse cystic mastopathy    Diverticulosis 06-2009   mild- Colonoscopy    DJD (degenerative joint disease)    Dysmetabolic syndrome X    Edema    Esophageal reflux    Esophageal stricture    Gout, unspecified    Hiatal hernia    History of gallstones    History of Helicobacter pylori infection    HLD (hyperlipidemia)    HTN (hypertension)    Hypertrophied anal papilla    Hypopotassemia    Hypothyroidism    Iron deficiency anemia, unspecified    Lung nodule    Osteoarthritis    Pneumonia 1993   Solitary cyst of breast    Type II or unspecified type diabetes mellitus without mention of complication, not stated as uncontrolled    pt. states not a diabetic   Unspecified sinusitis (chronic)    Vitamin B12 deficiency     Medications:  (Not in a hospital admission)   Assessment: Pharmacy consulted to dose heparin  in this 89 year old female admitted with ACS/NSTEMI.  Pt was on Eliquis  5 mg PO BID PTA  for prior DVT.  Last dose on 1/14 @ 1830.   Goal of Therapy:  Heparin  level 0.3-0.7 units/ml aPTT 66 - 102  seconds Monitor platelets by anticoagulation protocol: Yes   Plan:  - No bolus b/c of recent use of Eliquis  Start heparin  infusion at 800 units/hr - Will use aPTT to guide dosing until correlating with HL - Will draw aPTT 8 hrs after start of drip - Will check HL on 1/16 with AM labs - CBC daily   Gareth Fitzner D 03/29/2023,3:38 AM

## 2023-03-29 NOTE — ED Notes (Signed)
 This RN provided patient with warm blankets at this time.

## 2023-03-29 NOTE — H&P (Addendum)
 [place]   PATIENT NAME: Yesenia [name]    MR#:  [id number]  DATE OF BIRTH:  [birth year]  DATE OF ADMISSION:  03/28/2023  PRIMARY CARE PHYSICIAN: Jimmey Mould, MD   Patient is coming from: Home  REQUESTING/REFERRING PHYSICIAN: Buell Carmin, MD  CHIEF COMPLAINT:   Chief Complaint  Patient presents with   Chest Pain    HISTORY OF PRESENT ILLNESS:  Yesenia [name] is a 89 y.o. female with medical history significant for depression, GERD, hypertension, dyslipidemia, hypothyroidism, and type 2 diabetes mellitus, who presented to the emergency room with acute onset of chest pain felt as fluttering.  She has not been feeling well over the last couple weeks.  She admitted to nausea, with dry heaves and diarrhea which have mostly resolved with  diminished oral intake.  She stated that she was under family stress recently when her chest discomfort started.  She has been having mild cough productive of clear sputum.  She denied any dyspnea or wheezing or hemoptysis.  She has a history of generalized weakness and fatigue.  No leg pain or edema or recent travels or surgeries.  No bleeding diathesis.  No dysuria, oliguria or hematuria or flank pain.  ED Course: When the patient came to the ER, BP was 140/51 with respiratory rate of 22 and otherwise normal vital signs.  Labs revealed a BUN of 28 and creatinine 1.61 compared to 18 and 1.37 on 11/10/2021, BNP 246.3 and high-sensitivity troponin I 29 and later 40.  CBC was within normal.  INR is 1.4 with a PT 16.9 and PTT 37.  TSH was 1.73 and free T4 was normal at 0.99.  Respiratory panel came back positive for COVID-19 and was otherwise negative. EKG as reviewed by me : EKG showed normal sinus rhythm with rate of 72 with right bundle branch block. Imaging: Portable chest x-ray showed no acute cardiopulmonary disease.  The patient was given 1 L bolus of IV normal saline, for review aspirin  IV heparin .  She will be admitted to a medical  telemetry observation bed for further evaluation and management.   PAST MEDICAL HISTORY:   Past Medical History:  Diagnosis Date   Anemia, unspecified    Breast cyst    Chest pain, unspecified    Depression    Diarrhea of presumed infectious origin    Diffuse cystic mastopathy    Diverticulosis 06-2009   mild- Colonoscopy    DJD (degenerative joint disease)    Dysmetabolic syndrome X    Edema    Esophageal reflux    Esophageal stricture    Gout, unspecified    Hiatal hernia    History of gallstones    History of Helicobacter pylori infection    HLD (hyperlipidemia)    HTN (hypertension)    Hypertrophied anal papilla    Hypopotassemia    Hypothyroidism    Iron deficiency anemia, unspecified    Lung nodule    Osteoarthritis    Pneumonia 1993   Solitary cyst of breast    Type II or unspecified type diabetes mellitus without mention of complication, not stated as uncontrolled    pt. states not a diabetic   Unspecified sinusitis (chronic)    Vitamin B12 deficiency     PAST SURGICAL HISTORY:   Past Surgical History:  Procedure Laterality Date   BLADDER SUSPENSION     BREAST CYST EXCISION     left   CATARACT EXTRACTION, BILATERAL  2011   CHOLECYSTECTOMY  03/28/2011   Procedure: LAPAROSCOPIC CHOLECYSTECTOMY WITH INTRAOPERATIVE CHOLANGIOGRAM;  Surgeon: Kari Otto. Eli Grizzle, MD;  Location: [facility];  Service: General;  Laterality: N/A;   ERCP  10/06/2011   Procedure: ENDOSCOPIC RETROGRADE CHOLANGIOPANCREATOGRAPHY (ERCP);  Surgeon: Claudette Cue, MD;  Location: [facility] OR;  Service: Endoscopy;  Laterality: N/A;   PARTIAL HYSTERECTOMY     REPLACEMENT TOTAL KNEE     bilateral   SHOULDER SURGERY     bilateral   TONSILLECTOMY AND ADENOIDECTOMY     TUBAL LIGATION      SOCIAL HISTORY:   Social History   Tobacco Use   Smoking status: Former    Current packs/day: 0.00    Average packs/day: 0.3 packs/day for 5.0 years (1.3 ttl pk-yrs)    Types: Cigarettes    Start date: 03/21/1983     Quit date: 03/20/1988    Years since quitting: 35.0   Smokeless tobacco: Current    Types: Snuff  Substance Use Topics   Alcohol use: No    FAMILY HISTORY:   Family History  Problem Relation Age of Onset   Stroke Mother    Heart disease Father    Stomach cancer Brother    Breast cancer Maternal Aunt    Cancer Brother        bladder   Colon cancer Brother     DRUG ALLERGIES:   Allergies  Allergen Reactions   Cephalexin     REACTION: questionable   Codeine Nausea And Vomiting   Gabapentin      Headache   Sulfamethoxazole-Trimethoprim     REACTION: unspecified   Sulfonamide Derivatives    Vicodin [Hydrocodone-Acetaminophen ] Other (See Comments)    MAKES HER CRAZY     REVIEW OF SYSTEMS:   ROS As per history of present illness. All pertinent systems were reviewed above. Constitutional, HEENT, cardiovascular, respiratory, GI, GU, musculoskeletal, neuro, psychiatric, endocrine, integumentary and hematologic systems were reviewed and are otherwise negative/unremarkable except for positive findings mentioned above in the HPI.   MEDICATIONS AT HOME:   Prior to Admission medications   Medication Sig Start Date End Date Taking? Authorizing Provider  acetaminophen  (TYLENOL ) 500 MG tablet Take 500 mg by mouth every 6 (six) hours as needed for mild pain.    [provider]  APIXABAN  (ELIQUIS ) VTE STARTER PACK (10MG  AND 5MG ) Take as directed on package: start with two-5mg  tablets twice daily for 7 days. On day 8, switch to one-5mg  tablet twice daily. 10/10/21   Patel, Pranav M, MD  latanoprost  (XALATAN ) 0.005 % ophthalmic solution Place 1 drop into both eyes at bedtime. 05/30/19   [provider]  levothyroxine  (SYNTHROID , LEVOTHROID) 50 MCG tablet TAKE 1 TABLET EVERY DAY Patient taking differently: Take 50 mcg by mouth daily before breakfast. 11/27/13   Arcadio Knuckles, MD  losartan  (COZAAR ) 50 MG tablet Take 50 mg by mouth daily. 06/27/19   [provider]  pantoprazole  (PROTONIX ) 40 MG tablet TAKE ONE TABLET BY MOUTH ONCE DAILY BEFORE  BREAKFAST Patient taking differently: Take 40 mg by mouth daily. 09/10/15   Janel Medford, MD      VITAL SIGNS:  Blood pressure (!) 140/51, pulse 61, temperature 98.4 F (36.9 C), temperature source Axillary, resp. rate (!) 22, SpO2 98%.  PHYSICAL EXAMINATION:  Physical Exam  GENERAL:  89 y.o.-year-old female patient lying in the bed with no acute distress.  EYES: Pupils equal, round, reactive to light and accommodation. No scleral icterus. Extraocular muscles intact.  HEENT: Head atraumatic, normocephalic.  Oropharynx and nasopharynx clear.  NECK:  Supple, no jugular venous distention. No thyroid  enlargement, no tenderness.  LUNGS: Normal breath sounds bilaterally, no wheezing, rales,rhonchi or crepitation. No use of accessory muscles of respiration.  CARDIOVASCULAR: Regular rate and rhythm, S1, S2 normal. No murmurs, rubs, or gallops.  ABDOMEN: Soft, nondistended, nontender. Bowel sounds present. No organomegaly or mass.  EXTREMITIES: No pedal edema, cyanosis, or clubbing.  NEUROLOGIC: Cranial nerves II through XII are intact. Muscle strength 5/5 in all extremities. Sensation intact. Gait not checked.  PSYCHIATRIC: The patient is alert and oriented x 3.  Normal affect and good eye contact. SKIN: No obvious rash, lesion, or ulcer.   LABORATORY PANEL:   CBC Recent Labs  Lab 03/28/23 2339  WBC 5.7  HGB 14.1  HCT 42.2  PLT 235   ------------------------------------------------------------------------------------------------------------------  Chemistries  Recent Labs  Lab 03/28/23 2339  NA 138  K 4.0  CL 104  CO2 22  GLUCOSE 101*  BUN 28*  CREATININE 1.61*  CALCIUM  8.8*  MG 1.7  AST 22  ALT 14  ALKPHOS 62  BILITOT 0.7   ------------------------------------------------------------------------------------------------------------------  Cardiac Enzymes No results for input(s):  "TROPONINI" in the last 168 hours. ------------------------------------------------------------------------------------------------------------------  RADIOLOGY:  DG Chest Port 1 View Result Date: 03/28/2023 CLINICAL DATA:  Chest pain. EXAM: PORTABLE CHEST 1 VIEW COMPARISON:  11/10/2021 FINDINGS: Stable heart size and mediastinal contours. Aortic atherosclerosis. No focal airspace disease. No pulmonary edema. No pleural effusion or pneumothorax. No acute osseous findings. IMPRESSION: No acute findings. Electronically Signed   By: Chadwick Colonel M.D.   On: 03/28/2023 23:58      IMPRESSION AND PLAN:  Assessment and Plan: * COVID-19 virus infection - The patient be admitted to a medical telemetry observation bed. - We will continue hydration with IV normal saline. - Symptomatic conservative management will be provided. - The patient was not initially hypoxic and therefore no steroids are needed. - We will place her on zinc  sulfate and vitamin C .  Chest pain - This is associated with elevated troponin I. - We will continue IV heparin  for now and check D-dimer. - If D-dimer is elevated we will obtain chest CTA with improvement of her creatinine otherwise VQ scan. - I notified [facility] group about the patient.  Acute kidney injury superimposed on chronic kidney disease (HCC) - This is likely prerenal due to volume depletion and dehydration with COVID-19. - The patient be hydrated with IV normal saline and we will follow BMP. - We will avoid nephrotoxins.  History of pulmonary embolism - The patient will be on IV heparin  to replace Eliquis  for now pending further evaluation to rule out ACS and PE.Aaron Aas  GERD without esophagitis - We will continue PPI therapy.  Essential hypertension - We will hold off HCTZ and Cozaar  and place the patient on as needed IV hydralazine .  Hypothyroidism - We will continue Synthroid .   DVT prophylaxis: IV heparin  Advanced Care Planning:  Code Status: The  patient is DNR and DNI. Family Communication:  The plan of care was discussed in details with the patient (and family). I answered all questions. The patient agreed to proceed with the above mentioned plan. Further management will depend upon hospital course. Disposition Plan: Back to previous home environment Consults called: Cardiology. All the records are reviewed and case discussed with ED provider.  Status is: Observation  I certify that at the time of admission, it is my clinical judgment that the patient will require hospital care extending less than  2 midnights.                            Dispo: The patient is from: Home              Anticipated d/c is to: Home              Patient currently is not medically stable to d/c.              Difficult to place patient: No  Virgene Griffin M.D on 03/29/2023 at 5:25 AM  [facility]   From 7 PM-7 AM, contact night-coverage [email or web address]  CC: Primary care physician; Jimmey Mould, MD

## 2023-03-29 NOTE — Assessment & Plan Note (Signed)
 -  We will continue PPI therapy

## 2023-03-29 NOTE — Assessment & Plan Note (Signed)
-   We will hold off HCTZ and Cozaar  and place the patient on as needed IV hydralazine .

## 2023-03-29 NOTE — ED Notes (Signed)
 This RN assisted patient to the bedside commode at this time.

## 2023-03-29 NOTE — Assessment & Plan Note (Addendum)
-   This is associated with elevated troponin I. - We will continue IV heparin  for now and check D-dimer. - If D-dimer is elevated we will obtain chest CTA with improvement of her creatinine otherwise VQ scan. - I notified [facility] group about the patient.

## 2023-03-29 NOTE — Assessment & Plan Note (Addendum)
-   This is likely prerenal due to volume depletion and dehydration with COVID-19. - The patient be hydrated with IV normal saline and we will follow BMP. - We will avoid nephrotoxins.

## 2023-03-29 NOTE — Assessment & Plan Note (Signed)
-   The patient be admitted to a medical telemetry observation bed. - We will continue hydration with IV normal saline. - Symptomatic conservative management will be provided. - The patient was not initially hypoxic and therefore no steroids are needed. - We will place her on zinc  sulfate and vitamin C .

## 2023-03-29 NOTE — Assessment & Plan Note (Signed)
 -  We will continue Synthroid.

## 2023-03-29 NOTE — Progress Notes (Signed)
 PHARMACY - ANTICOAGULATION CONSULT NOTE  Pharmacy Consult for Heparin   Indication: chest pain/ACS  Allergies  Allergen Reactions   Cephalexin     REACTION: questionable   Codeine Nausea And Vomiting   Gabapentin      Headache   Sulfamethoxazole-Trimethoprim     REACTION: unspecified   Sulfonamide Derivatives Nausea Only   Vicodin [Hydrocodone-Acetaminophen ] Other (See Comments)    MAKES HER CRAZY     Patient Measurements: Weight: 83.1 kg (183 lb 3.2 oz) Heparin  Dosing Weight: 66 kg   Vital Signs: Temp: 98.7 F (37.1 C) (01/15 1500) Temp Source: Axillary (01/15 1500) BP: 128/62 (01/15 1500) Pulse Rate: 75 (01/15 1500)  Labs: Recent Labs    03/28/23 2339 03/29/23 0128 03/29/23 0346 03/29/23 0432 03/29/23 1449  HGB 14.1  --   --  13.2  --   HCT 42.2  --   --  39.9  --   PLT 235  --   --  244  --   APTT  --   --  37*  --  93*  LABPROT  --   --  16.9*  --   --   INR  --   --  1.4*  --   --   HEPARINUNFRC  --   --  >1.10*  --   --   CREATININE 1.61*  --   --  1.58*  --   TROPONINIHS 29* 40*  --   --   --     CrCl cannot be calculated (Unknown ideal weight.).   Medical History: Past Medical History:  Diagnosis Date   Anemia, unspecified    Breast cyst    Chest pain, unspecified    Depression    Diarrhea of presumed infectious origin    Diffuse cystic mastopathy    Diverticulosis 06-2009   mild- Colonoscopy    DJD (degenerative joint disease)    Dysmetabolic syndrome X    Edema    Esophageal reflux    Esophageal stricture    Gout, unspecified    Hiatal hernia    History of gallstones    History of Helicobacter pylori infection    HLD (hyperlipidemia)    HTN (hypertension)    Hypertrophied anal papilla    Hypopotassemia    Hypothyroidism    Iron deficiency anemia, unspecified    Lung nodule    Osteoarthritis    Pneumonia 1993   Solitary cyst of breast    Type II or unspecified type diabetes mellitus without mention of complication, not stated as  uncontrolled    pt. states not a diabetic   Unspecified sinusitis (chronic)    Vitamin B12 deficiency    Medications:  (Not in a hospital admission)  Assessment: Pharmacy consulted to dose heparin  in this 89 year old female admitted with ACS/NSTEMI.  Pt was on Eliquis  5 mg PO BID PTA for prior DVT.  Last dose on 1/14 @ 1830.  1/15 1449 aPTT 93 - therapeutic x1  Goal of Therapy:  Heparin  level 0.3-0.7 units/ml aPTT 66 - 102  seconds Monitor platelets by anticoagulation protocol: Yes   Plan:  Continue heparin  infusion at 800 units/hour Check confirmatory aPTT in 8 hours - Will use aPTT to guide dosing until correlating with HL - Will check HL on 1/16 with AM labs - CBC daily  Thank you for involving pharmacy in this patient's care.   Ananias Balls, PharmD Clinical Pharmacist 03/29/2023 3:30 PM

## 2023-03-29 NOTE — Assessment & Plan Note (Signed)
-   The patient will be on IV heparin  to replace Eliquis  for now pending further evaluation to rule out ACS and PE.Aaron Aas

## 2023-03-29 NOTE — ED Notes (Signed)
 Called CCMD and added pt to board.

## 2023-03-30 DIAGNOSIS — U071 COVID-19: Secondary | ICD-10-CM | POA: Diagnosis not present

## 2023-03-30 LAB — BASIC METABOLIC PANEL
Anion gap: 12 (ref 5–15)
BUN: 21 mg/dL (ref 8–23)
CO2: 22 mmol/L (ref 22–32)
Calcium: 8.3 mg/dL — ABNORMAL LOW (ref 8.9–10.3)
Chloride: 105 mmol/L (ref 98–111)
Creatinine, Ser: 1.45 mg/dL — ABNORMAL HIGH (ref 0.44–1.00)
GFR, Estimated: 34 mL/min — ABNORMAL LOW (ref >60)
Glucose, Bld: 88 mg/dL (ref 70–99)
Potassium: 3.3 mmol/L — ABNORMAL LOW (ref 3.5–5.1)
Sodium: 139 mmol/L (ref 135–145)

## 2023-03-30 LAB — MAGNESIUM: Magnesium: 1.5 mg/dL — ABNORMAL LOW (ref 1.7–2.4)

## 2023-03-30 MED ORDER — MAGNESIUM SULFATE 2 GM/50ML IV SOLN
2.0000 g | Freq: Once | INTRAVENOUS | Status: AC
  Administered 2023-03-30: 2 g via INTRAVENOUS
  Filled 2023-03-30: qty 50

## 2023-03-30 MED ORDER — POTASSIUM CHLORIDE 20 MEQ PO PACK
40.0000 meq | PACK | Freq: Once | ORAL | Status: AC
  Administered 2023-03-30: 40 meq via ORAL
  Filled 2023-03-30: qty 2

## 2023-03-30 MED ORDER — ONDANSETRON HCL 4 MG/2ML IJ SOLN
4.0000 mg | Freq: Four times a day (QID) | INTRAMUSCULAR | Status: DC | PRN
  Administered 2023-03-30 – 2023-03-31 (×2): 4 mg via INTRAVENOUS
  Filled 2023-03-30 (×2): qty 2

## 2023-03-30 NOTE — ED Notes (Signed)
Lab called to collect 0500 labs at this time.

## 2023-03-30 NOTE — Progress Notes (Signed)
  PROGRESS NOTE    Yesenia [name]  ZOX:[id number] DOB: Dec 27, 1929 DOA: 03/28/2023 PCP: Daisy Floro, MD  [id number]  LOS: 0 days   Brief hospital course:   Assessment & Plan: Yesenia [name] is a 89 y.o. female with medical history significant for depression, GERD, hypertension, dyslipidemia, hypothyroidism, and type 2 diabetes mellitus, who presented to the emergency room with acute onset of chest pain felt as fluttering.  She has not been feeling well over the last couple weeks.    Chest pain - trop 40's.  Was started on heparin gtt on admission, since d/c'ed.  Cardio was not consulted on admission.  Discussed with cardio PA, will hold on formal consult until after stress test, however, Cardio today rec postponing stress test until further out from COVID infection, to be done as outpatient.   Plan: --plan on Echo instead --outpatient stress test  * COVID-19 virus infection --not hypoxic, CXR clear.  No need for steroid or anti-viral.  chronic kidney disease 3b AKI, ruled out --Cr around baseline  History of pulmonary embolism --cont home Eliquis  GERD without esophagitis - continue PPI therapy.  Essential hypertension - cont losartan --hold hydrochlorothiazide  Hypothyroidism - continue Synthroid.   DVT prophylaxis: [id number] Code Status: DNR  Family Communication:  Level of care: Telemetry Medical Dispo:   The patient is from: home Anticipated d/c is to: home Anticipated d/c date is: tomorrow   Subjective and Interval History:  Pt complained of nausea which later resolved.  No abdominal pain today.    Cardio today rec postponing stress test until further out from COVID infection, to be done as outpatient.  Cardio rec Echo instead.     Objective: Vitals:   03/30/23 1145 03/30/23 1146 03/30/23 1200 03/30/23 1621  BP: (!) 143/99  (!) 152/79 124/78  Pulse: 97  91 67  Resp: (!) 24 19 (!) 23 18  Temp:      TempSrc:      SpO2: 98%  97% 98%  Weight:        No intake or output data in the 24 hours ending 03/30/23 1719 Filed Weights   03/29/23 1203  Weight: 83.1 kg    Examination:   Constitutional: NAD, AAOx3 HEENT: conjunctivae and lids normal, EOMI, hard of hearing CV: No cyanosis.   RESP: normal respiratory effort, on RA Neuro: II - XII grossly intact.   Psych: Normal mood and affect.     Data Reviewed: I have personally reviewed labs and imaging studies  Time spent: 50 minutes  Darlin Priestly, MD [facility] If 7PM-7AM, please contact night-coverage 03/30/2023, 5:19 PM

## 2023-03-30 NOTE — ED Notes (Signed)
This RN assisted patient to the bedside commode at this time. Patient was assisted back in bed and warm blankets.

## 2023-03-30 NOTE — Care Management Obs Status (Signed)
MEDICARE OBSERVATION STATUS NOTIFICATION   Patient Details  Name: Yesenia [name] MRN: [id number] Date of Birth: [birth year]   Medicare Observation Status Notification Given:  Orland Dec, CMA 03/30/2023, 3:38 PM

## 2023-03-31 ENCOUNTER — Observation Stay (HOSPITAL_BASED_OUTPATIENT_CLINIC_OR_DEPARTMENT_OTHER)
Admit: 2023-03-31 | Discharge: 2023-03-31 | Disposition: A | Discharge status: Home / Self Care | Payer: Medicare Other | Attending: Physician Assistant | Admitting: Physician Assistant

## 2023-03-31 DIAGNOSIS — R079 Chest pain, unspecified: Secondary | ICD-10-CM

## 2023-03-31 DIAGNOSIS — U071 COVID-19: Secondary | ICD-10-CM | POA: Diagnosis not present

## 2023-03-31 LAB — BASIC METABOLIC PANEL
Anion gap: 10 (ref 5–15)
BUN: 23 mg/dL (ref 8–23)
CO2: 24 mmol/L (ref 22–32)
Calcium: 8.6 mg/dL — ABNORMAL LOW (ref 8.9–10.3)
Chloride: 106 mmol/L (ref 98–111)
Creatinine, Ser: 1.6 mg/dL — ABNORMAL HIGH (ref 0.44–1.00)
GFR, Estimated: 30 mL/min — ABNORMAL LOW (ref >60)
Glucose, Bld: 87 mg/dL (ref 70–99)
Potassium: 3.8 mmol/L (ref 3.5–5.1)
Sodium: 140 mmol/L (ref 135–145)

## 2023-03-31 LAB — ECHOCARDIOGRAM COMPLETE
AR max vel: 1.97 cm2
AV Area VTI: 1.92 cm2
AV Area mean vel: 1.93 cm2
AV Mean grad: 4 mm[Hg]
AV Peak grad: 7.1 mm[Hg]
Ao pk vel: 1.33 m/s
Area-P 1/2: 2.01 cm2
Est EF: >75
MV VTI: 0.77 cm2
S' Lateral: 2.9 cm
Weight: 2931.24 [oz_av]

## 2023-03-31 LAB — CBG MONITORING, ED: Glucose-Capillary: 95 mg/dL (ref 70–99)

## 2023-03-31 LAB — MAGNESIUM: Magnesium: 2.3 mg/dL (ref 1.7–2.4)

## 2023-03-31 MED ORDER — ONDANSETRON HCL 4 MG PO TABS: 4.0000 mg | ORAL_TABLET | Freq: Every day | ORAL | 0 refills | Status: AC | PRN

## 2023-03-31 MED ORDER — ZINC SULFATE 220 (50 ZN) MG PO CAPS: 220.0000 mg | ORAL_CAPSULE | Freq: Every day | ORAL | Status: AC

## 2023-03-31 MED ORDER — ASCORBIC ACID 1000 MG PO TABS: 1000.0000 mg | ORAL_TABLET | Freq: Every day | ORAL | Status: AC

## 2023-03-31 NOTE — Progress Notes (Signed)
*  PRELIMINARY RESULTS* Echocardiogram 2D Echocardiogram has been performed.  Yesenia [name] 03/31/2023, 8:33 AM

## 2023-03-31 NOTE — Discharge Summary (Signed)
Physician Discharge Summary   Yesenia [name]  female DOB: 1930-02-15  ZOX:[id number]  PCP: Yesenia Floro, MD  Admit date: 03/28/2023 Discharge date: 03/31/2023  Admitted From: home Disposition:  home CODE STATUS: DNR   Hospital Course:  For full details, please see H&P, progress notes, consult notes and ancillary notes.  Briefly,  Yesenia [name] is a 89 y.o. female with medical history significant for depression, GERD, hypertension, dyslipidemia, hypothyroidism, and type 2 diabetes mellitus, who presented to the emergency room with acute onset of chest pain felt as fluttering.  She has not been feeling well over the last couple weeks.    Chest pain - trop 40's.  Was started on heparin gtt on admission, since d/c'ed.  Cardio was not consulted on admission.  Discussed with [facility] cardio PA, planned to hold on formal consult until after stress test, however, Cardio next day rec postponing stress test until further out from COVID infection, to be done as outpatient.   --Echo was done and reviewed by cardio PA with no acute finding.  Cardio to arrange outpatient f/u for pt.   * COVID-19 virus infection --not hypoxic, CXR clear.  No need for steroid or anti-viral.   chronic kidney disease 3b AKI, ruled out --Cr around baseline   History of pulmonary embolism --cont home Eliquis   GERD without esophagitis - continue PPI therapy.   Essential hypertension -cont losartan --home hydrochlorothiazide d/c'ed   Hypothyroidism - continue Synthroid.   Discharge Diagnoses:  Principal Problem:   COVID-19 virus infection Active Problems:   Chest pain   Acute kidney injury superimposed on chronic kidney disease (HCC)   Hypothyroidism   Essential hypertension   GERD without esophagitis   History of pulmonary embolism   Elevated troponin   Nonspecific chest pain     Discharge Instructions:  Allergies as of 03/31/2023       Reactions   Cephalexin    REACTION:  questionable   Codeine Nausea And Vomiting   Gabapentin    Headache   Sulfamethoxazole-trimethoprim    REACTION: unspecified   Sulfonamide Derivatives Nausea Only   Vicodin [hydrocodone-acetaminophen] Other (See Comments)   MAKES HER CRAZY        Medication List     STOP taking these medications    hydrochlorothiazide 25 MG tablet Commonly known as: HYDRODIURIL       TAKE these medications    acetaminophen 500 MG tablet Commonly known as: TYLENOL Take 500 mg by mouth every 6 (six) hours as needed for mild pain.   ascorbic acid 1000 MG tablet Commonly known as: VITAMIN C Take 1 tablet (1,000 mg total) by mouth daily. Start taking on: April 01, 2023   Eliquis 2.5 MG Tabs tablet Generic drug: apixaban Take 2.5 mg by mouth 2 (two) times daily.   latanoprost 0.005 % ophthalmic solution Commonly known as: XALATAN Place 1 drop into both eyes at bedtime.   levothyroxine 50 MCG tablet Commonly known as: SYNTHROID TAKE 1 TABLET EVERY DAY What changed: when to take this   losartan 50 MG tablet Commonly known as: COZAAR Take 50 mg by mouth daily.   ondansetron 4 MG tablet Commonly known as: Zofran Take 1 tablet (4 mg total) by mouth daily as needed for nausea or vomiting.   pantoprazole 40 MG tablet Commonly known as: PROTONIX TAKE ONE TABLET BY MOUTH ONCE DAILY BEFORE  BREAKFAST What changed: See the new instructions.   timolol 0.5 % ophthalmic solution Commonly known as:  TIMOPTIC Place 1 drop into both eyes 2 (two) times daily.   zinc sulfate (50mg  elemental zinc) 220 (50 Zn) MG capsule Take 1 capsule (220 mg total) by mouth daily. Start taking on: April 01, 2023         Follow-up Information     Yesenia Floro, MD Follow up in 1 week(s).   Specialty: Family Medicine Contact information: [address] [place] [address] [phone]                 Allergies  Allergen Reactions   Cephalexin     REACTION: questionable    Codeine Nausea And Vomiting   Gabapentin     Headache   Sulfamethoxazole-Trimethoprim     REACTION: unspecified   Sulfonamide Derivatives Nausea Only   Vicodin [Hydrocodone-Acetaminophen] Other (See Comments)    MAKES HER CRAZY      The results of significant diagnostics from this hospitalization (including imaging, microbiology, ancillary and laboratory) are listed below for reference.   Consultations:   Procedures/Studies: ECHOCARDIOGRAM COMPLETE Result Date: 03/31/2023    ECHOCARDIOGRAM REPORT   Patient Name:   Yesenia [name] Date of Exam: 03/31/2023 Medical Rec #:  [id number]       Height:       60.0 in Accession #:    [id number]      Weight:       183.2 lb Date of Birth:  [birth year]       BSA:          1.798 m Patient Age:    89 years        BP:           129/61 mmHg Patient Gender: F               HR:           67 bpm. Exam Location:  [facility] Procedure: 2D Echo, Cardiac Doppler and Color Doppler Indications:     Chest pain R07.9  History:         Patient has prior history of Echocardiogram examinations, most                  recent 10/08/2021. Risk Factors:Hypertension, Diabetes and                  Dyslipidemia.  Sonographer:     Cristela Blue Referring Phys:  [id number] Raymon Mutton DUNN Diagnosing Phys: Debbe Odea MD IMPRESSIONS  1. Basal septum measuring 1.8cm. Left ventricular ejection fraction, by estimation, is >75%. The left ventricle has hyperdynamic function. The left ventricle has no regional wall motion abnormalities. There is severe asymmetric left ventricular hypertrophy of the septal segment. Left ventricular diastolic parameters are consistent with Grade I diastolic dysfunction (impaired relaxation).  2. Right ventricular systolic function is normal. The right ventricular size is normal.  3. Left atrial size was mildly dilated.  4. HR 80bpm. The mitral valve is degenerative. Mild mitral valve regurgitation. Severe mitral stenosis. The mean mitral valve gradient is 10.0 mmHg. Moderate  to severe mitral annular calcification.  5. Tricuspid valve regurgitation is mild to moderate.  6. The aortic valve was not well visualized. Aortic valve regurgitation is not visualized. FINDINGS  Left Ventricle: Basal septum measuring 1.8cm. Left ventricular ejection fraction, by estimation, is >75%. The left ventricle has hyperdynamic function. The left ventricle has no regional wall motion abnormalities. The left ventricular internal cavity size was normal in size. There is severe asymmetric left ventricular hypertrophy of  the septal segment. Left ventricular diastolic parameters are consistent with Grade I diastolic dysfunction (impaired relaxation). Right Ventricle: The right ventricular size is normal. Right vetricular wall thickness was not well visualized. Right ventricular systolic function is normal. Left Atrium: Left atrial size was mildly dilated. Right Atrium: Right atrial size was normal in size. Pericardium: There is no evidence of pericardial effusion. Mitral Valve: HR 80bpm. The mitral valve is degenerative in appearance. Moderate to severe mitral annular calcification. Mild mitral valve regurgitation. Severe mitral valve stenosis. MV peak gradient, 19.7 mmHg. The mean mitral valve gradient is 10.0 mmHg. Tricuspid Valve: The tricuspid valve is not well visualized. Tricuspid valve regurgitation is mild to moderate. Aortic Valve: The aortic valve was not well visualized. Aortic valve regurgitation is not visualized. Aortic valve mean gradient measures 4.0 mmHg. Aortic valve peak gradient measures 7.1 mmHg. Aortic valve area, by VTI measures 1.92 cm. Pulmonic Valve: The pulmonic valve was not well visualized. Pulmonic valve regurgitation is not visualized. Aorta: The aortic root is normal in size and structure. Venous: The inferior vena cava was not well visualized. IAS/Shunts: No atrial level shunt detected by color flow Doppler.  LEFT VENTRICLE PLAX 2D LVIDd:         4.20 cm   Diastology LVIDs:          2.90 cm   LV e' medial:    5.44 cm/s LV PW:         0.80 cm   LV E/e' medial:  27.2 LV IVS:        1.80 cm   LV e' lateral:   10.80 cm/s LVOT diam:     1.80 cm   LV E/e' lateral: 13.7 LV SV:         46 LV SV Index:   25 LVOT Area:     2.54 cm  RIGHT VENTRICLE RV Basal diam:  3.20 cm RV Mid diam:    2.40 cm RV S prime:     13.10 cm/s TAPSE (M-mode): 2.4 cm LEFT ATRIUM             Index        RIGHT ATRIUM           Index LA diam:        4.40 cm 2.45 cm/m   RA Area:     14.20 cm LA Vol (A2C):   50.1 ml 27.86 ml/m  RA Volume:   33.10 ml  18.41 ml/m LA Vol (A4C):   34.4 ml 19.13 ml/m LA Biplane Vol: 41.6 ml 23.13 ml/m  AORTIC VALVE AV Area (Vmax):    1.97 cm AV Area (Vmean):   1.93 cm AV Area (VTI):     1.92 cm AV Vmax:           133.00 cm/s AV Vmean:          91.750 cm/s AV VTI:            0.238 m AV Peak Grad:      7.1 mmHg AV Mean Grad:      4.0 mmHg LVOT Vmax:         103.00 cm/s LVOT Vmean:        69.700 cm/s LVOT VTI:          0.180 m LVOT/AV VTI ratio: 0.75  AORTA Ao Root diam: 2.20 cm MITRAL VALVE                TRICUSPID VALVE MV Area (PHT): 2.01 cm  TR Peak grad:   34.1 mmHg MV Area VTI:   0.77 cm     TR Vmax:        292.00 cm/s MV Peak grad:  19.7 mmHg MV Mean grad:  10.0 mmHg    SHUNTS MV Vmax:       2.22 m/s     Systemic VTI:  0.18 m MV Vmean:      150.0 cm/s   Systemic Diam: 1.80 cm MV Decel Time: 378 msec MV E velocity: 148.00 cm/s MV A velocity: 186.00 cm/s MV E/A ratio:  0.80 Debbe Odea MD Electronically signed by Debbe Odea MD Signature Date/Time: 03/31/2023/8:44:48 AM    Final (Updated)    DG Chest Port 1 View Result Date: 03/28/2023 CLINICAL DATA:  Chest pain. EXAM: PORTABLE CHEST 1 VIEW COMPARISON:  11/10/2021 FINDINGS: Stable heart size and mediastinal contours. Aortic atherosclerosis. No focal airspace disease. No pulmonary edema. No pleural effusion or pneumothorax. No acute osseous findings. IMPRESSION: No acute findings. Electronically Signed   By: Narda Rutherford M.D.   On: 03/28/2023 23:58      Labs: BNP (last 3 results) Recent Labs    03/28/23 2339  BNP 246.3*   Basic Metabolic Panel: Recent Labs  Lab 03/28/23 2339 03/29/23 0432 03/30/23 0640 03/31/23 0545  NA 138 140 139 140  K 4.0 3.5 3.3* 3.8  CL 104 103 105 106  CO2 22 23 22 24   GLUCOSE 101* 106* 88 87  BUN 28* 26* 21 23  CREATININE 1.61* 1.58* 1.45* 1.60*  CALCIUM 8.8* 8.3* 8.3* 8.6*  MG 1.7  --  1.5* 2.3   Liver Function Tests: Recent Labs  Lab 03/28/23 2339  AST 22  ALT 14  ALKPHOS 62  BILITOT 0.7  PROT 6.8  ALBUMIN 3.5   Recent Labs  Lab 03/28/23 2339  LIPASE 81*   No results for input(s): "AMMONIA" in the last 168 hours. CBC: Recent Labs  Lab 03/28/23 2339 03/29/23 0432  WBC 5.7 6.1  NEUTROABS 3.1  --   HGB 14.1 13.2  HCT 42.2 39.9  MCV 97.0 97.1  PLT 235 244   Cardiac Enzymes: No results for input(s): "CKTOTAL", "CKMB", "CKMBINDEX", "TROPONINI" in the last 168 hours. BNP: Invalid input(s): "POCBNP" CBG: Recent Labs  Lab 03/31/23 0811  GLUCAP 95   D-Dimer Recent Labs    03/29/23 0346  DDIMER 1.29*   Hgb A1c No results for input(s): "HGBA1C" in the last 72 hours. Lipid Profile No results for input(s): "CHOL", "HDL", "LDLCALC", "TRIG", "CHOLHDL", "LDLDIRECT" in the last 72 hours. Thyroid function studies Recent Labs    03/28/23 2339  TSH 1.739   Anemia work up Recent Labs    03/29/23 0432  FERRITIN 50   Urinalysis    Component Value Date/Time   COLORURINE YELLOW 11/10/2021 1107   COLORURINE YELLOW 11/10/2021 1107   APPEARANCEUR CLOUDY (A) 11/10/2021 1107   APPEARANCEUR Sl Cloudy (A) 11/10/2021 1107   LABSPEC 1.019 11/10/2021 1107   LABSPEC 1.025 11/10/2021 1107   PHURINE 6.0 11/10/2021 1107   PHURINE 6.0 11/10/2021 1107   GLUCOSEU NEGATIVE 11/10/2021 1107   GLUCOSEU NEGATIVE 11/10/2021 1107   HGBUR TRACE (A) 11/10/2021 1107   HGBUR SMALL (A) 11/10/2021 1107   BILIRUBINUR SMALL (A) 11/10/2021 1107    KETONESUR NEGATIVE 11/10/2021 1107   KETONESUR NEGATIVE 11/10/2021 1107   PROTEINUR 1+ (A) 11/10/2021 1107   UROBILINOGEN 0.2 11/10/2021 1107   NITRITE NEGATIVE 11/10/2021 1107   NITRITE NEGATIVE 11/10/2021 1107  LEUKOCYTESUR 1+ (A) 11/10/2021 1107   LEUKOCYTESUR TRACE (A) 11/10/2021 1107   Sepsis Labs Recent Labs  Lab 03/28/23 2339 03/29/23 0432  WBC 5.7 6.1   Microbiology Recent Results (from the past 240 hours)  Resp panel by RT-PCR (RSV, Flu A&B, Covid) Anterior Nasal Swab     Status: Abnormal   Collection Time: 03/28/23 11:39 PM   Specimen: Anterior Nasal Swab  Result Value Ref Range Status   SARS Coronavirus 2 by RT PCR POSITIVE (A) NEGATIVE Final    Comment: (NOTE) SARS-CoV-2 target nucleic acids are DETECTED.  The SARS-CoV-2 RNA is generally detectable in upper respiratory specimens during the acute phase of infection. Positive results are indicative of the presence of the identified virus, but do not rule out bacterial infection or co-infection with other pathogens not detected by the test. Clinical correlation with patient history and other diagnostic information is necessary to determine patient infection status. The expected result is Negative.  Fact Sheet for Patients: BloggerCourse.com  Fact Sheet for Healthcare Providers: SeriousBroker.it  This test is not yet approved or cleared by the [place] FDA and  has been authorized for detection and/or diagnosis of SARS-CoV-2 by FDA under an Emergency Use Authorization (EUA).  This EUA will remain in effect (meaning this test can be used) for the duration of  the COVID-19 declaration under Section 564(b)(1) of the A ct, 21 U.S.C. section 360bbb-3(b)(1), unless the authorization is terminated or revoked sooner.     Influenza A by PCR NEGATIVE NEGATIVE Final   Influenza B by PCR NEGATIVE NEGATIVE Final    Comment: (NOTE) The Xpert Xpress SARS-CoV-2/FLU/RSV  plus assay is intended as an aid in the diagnosis of influenza from Nasopharyngeal swab specimens and should not be used as a sole basis for treatment. Nasal washings and aspirates are unacceptable for Xpert Xpress SARS-CoV-2/FLU/RSV testing.  Fact Sheet for Patients: BloggerCourse.com  Fact Sheet for Healthcare Providers: SeriousBroker.it  This test is not yet approved or cleared by the [place] FDA and has been authorized for detection and/or diagnosis of SARS-CoV-2 by FDA under an Emergency Use Authorization (EUA). This EUA will remain in effect (meaning this test can be used) for the duration of the COVID-19 declaration under Section 564(b)(1) of the Act, 21 U.S.C. section 360bbb-3(b)(1), unless the authorization is terminated or revoked.     Resp Syncytial Virus by PCR NEGATIVE NEGATIVE Final    Comment: (NOTE) Fact Sheet for Patients: BloggerCourse.com  Fact Sheet for Healthcare Providers: SeriousBroker.it  This test is not yet approved or cleared by the [place] FDA and has been authorized for detection and/or diagnosis of SARS-CoV-2 by FDA under an Emergency Use Authorization (EUA). This EUA will remain in effect (meaning this test can be used) for the duration of the COVID-19 declaration under Section 564(b)(1) of the Act, 21 U.S.C. section 360bbb-3(b)(1), unless the authorization is terminated or revoked.  Performed at [facility], [address] [place], [place] [address]      Total time spend on discharging this patient, including the last patient exam, discussing the hospital stay, instructions for ongoing care as it relates to all pertinent caregivers, as well as preparing the medical discharge records, prescriptions, and/or referrals as applicable, is 35 minutes.    Darlin Priestly, MD  [facility] 03/31/2023, 10:48 AM

## 2023-04-07 DIAGNOSIS — K5900 Constipation, unspecified: Secondary | ICD-10-CM | POA: Diagnosis not present

## 2023-04-07 DIAGNOSIS — M810 Age-related osteoporosis without current pathological fracture: Secondary | ICD-10-CM | POA: Diagnosis not present

## 2023-04-07 DIAGNOSIS — N184 Chronic kidney disease, stage 4 (severe): Secondary | ICD-10-CM | POA: Diagnosis not present

## 2023-04-07 DIAGNOSIS — I82409 Acute embolism and thrombosis of unspecified deep veins of unspecified lower extremity: Secondary | ICD-10-CM | POA: Diagnosis not present

## 2023-04-07 DIAGNOSIS — I10 Essential (primary) hypertension: Secondary | ICD-10-CM | POA: Diagnosis not present

## 2023-04-07 DIAGNOSIS — U071 COVID-19: Secondary | ICD-10-CM | POA: Diagnosis not present

## 2023-04-07 DIAGNOSIS — E039 Hypothyroidism, unspecified: Secondary | ICD-10-CM | POA: Diagnosis not present

## 2023-04-07 DIAGNOSIS — E7800 Pure hypercholesterolemia, unspecified: Secondary | ICD-10-CM | POA: Diagnosis not present

## 2023-04-07 DIAGNOSIS — Z09 Encounter for follow-up examination after completed treatment for conditions other than malignant neoplasm: Secondary | ICD-10-CM | POA: Diagnosis not present

## 2023-05-25 DIAGNOSIS — E039 Hypothyroidism, unspecified: Secondary | ICD-10-CM | POA: Diagnosis not present

## 2023-05-25 DIAGNOSIS — I10 Essential (primary) hypertension: Secondary | ICD-10-CM | POA: Diagnosis not present

## 2023-05-25 DIAGNOSIS — M810 Age-related osteoporosis without current pathological fracture: Secondary | ICD-10-CM | POA: Diagnosis not present

## 2023-05-25 DIAGNOSIS — E7800 Pure hypercholesterolemia, unspecified: Secondary | ICD-10-CM | POA: Diagnosis not present

## 2023-06-08 DIAGNOSIS — R079 Chest pain, unspecified: Secondary | ICD-10-CM | POA: Diagnosis not present

## 2023-06-08 DIAGNOSIS — M810 Age-related osteoporosis without current pathological fracture: Secondary | ICD-10-CM | POA: Diagnosis not present

## 2023-06-08 DIAGNOSIS — R262 Difficulty in walking, not elsewhere classified: Secondary | ICD-10-CM | POA: Diagnosis not present

## 2023-06-08 DIAGNOSIS — K219 Gastro-esophageal reflux disease without esophagitis: Secondary | ICD-10-CM | POA: Diagnosis not present

## 2023-06-08 DIAGNOSIS — N184 Chronic kidney disease, stage 4 (severe): Secondary | ICD-10-CM | POA: Diagnosis not present

## 2023-06-08 DIAGNOSIS — N2581 Secondary hyperparathyroidism of renal origin: Secondary | ICD-10-CM | POA: Diagnosis not present

## 2023-06-08 DIAGNOSIS — E7800 Pure hypercholesterolemia, unspecified: Secondary | ICD-10-CM | POA: Diagnosis not present

## 2023-06-08 DIAGNOSIS — I10 Essential (primary) hypertension: Secondary | ICD-10-CM | POA: Diagnosis not present

## 2023-06-08 DIAGNOSIS — Z86718 Personal history of other venous thrombosis and embolism: Secondary | ICD-10-CM | POA: Diagnosis not present

## 2023-06-08 DIAGNOSIS — E039 Hypothyroidism, unspecified: Secondary | ICD-10-CM | POA: Diagnosis not present

## 2023-06-20 DIAGNOSIS — N184 Chronic kidney disease, stage 4 (severe): Secondary | ICD-10-CM | POA: Diagnosis not present

## 2023-06-20 DIAGNOSIS — K219 Gastro-esophageal reflux disease without esophagitis: Secondary | ICD-10-CM | POA: Diagnosis not present

## 2023-06-20 DIAGNOSIS — I129 Hypertensive chronic kidney disease with stage 1 through stage 4 chronic kidney disease, or unspecified chronic kidney disease: Secondary | ICD-10-CM | POA: Diagnosis not present

## 2023-06-23 ENCOUNTER — Ambulatory Visit: Payer: Medicare Other | Admitting: Cardiovascular Disease

## 2023-09-19 NOTE — Progress Notes (Signed)
 Referring-Yesenia Dale Gull, MD Reason for referral-mitral stenosis and chest pain  HPI: 89 year old female for evaluation of mitral stenosis and chest pain at request of Yesenia Dale Gull, MD.  Patient seen previously but not since 2021.  Nuclear study January 2019 showed ejection fraction 64% and no ischemia or infarction.  CTA July 2023 showed pulmonary embolus and coronary atherosclerosis.  Echocardiogram January 2025 showed ejection fraction greater than 75%, basal septal hypertrophy, grade 1 diastolic dysfunction, mild left atrial enlargement, mild mitral regurgitation, severe mitral stenosis with mean gradient 10 mmHg, mild to moderate tricuspid regurgitation.  Note she was not felt a candidate for balloon valvuloplasty in the past given subvalvular calcification.  We felt that she would not be a good candidate for mitral valve replacement given age and underlying medical problems.  Previously we elected to continue with conservative measures.  Recently seen with chest pain and cardiology asked to evaluate.  Patient states she has had occasional chest pain since 2014 when she fell.  The pain is under her left breast and radiates to her shoulder.  Last several minutes and resolved spontaneously.  Not exertional.  No associated symptoms.  She does not have exertional chest pain.  She does have dyspnea on exertion but denies orthopnea or PND.  She has had worsening pedal edema recently since starting amlodipine .  She denies syncope.  Current Outpatient Medications  Medication Sig Dispense Refill   acetaminophen  (TYLENOL ) 500 MG tablet Take 500 mg by mouth every 6 (six) hours as needed for mild pain.     amLODipine  (NORVASC ) 2.5 MG tablet Take 2.5 mg by mouth daily.     Cholecalciferol (VITAMIN D-3) 25 MCG (1000 UT) CAPS 1 capsule.     ELIQUIS  2.5 MG TABS tablet Take 2.5 mg by mouth 2 (two) times daily.     hydrochlorothiazide  (HYDRODIURIL ) 25 MG tablet Take 25 mg by mouth every morning.      latanoprost  (XALATAN ) 0.005 % ophthalmic solution Place 1 drop into both eyes at bedtime.     levothyroxine  (SYNTHROID , LEVOTHROID) 50 MCG tablet TAKE 1 TABLET EVERY DAY 90 tablet 1   losartan  (COZAAR ) 50 MG tablet Take 50 mg by mouth daily.     ondansetron  (ZOFRAN ) 4 MG tablet Take 1 tablet (4 mg total) by mouth daily as needed for nausea or vomiting. 20 tablet 0   pantoprazole  (PROTONIX ) 40 MG tablet TAKE ONE TABLET BY MOUTH ONCE DAILY BEFORE  BREAKFAST 60 tablet 0   timolol  (TIMOPTIC ) 0.5 % ophthalmic solution Place 1 drop into both eyes 2 (two) times daily.     No current facility-administered medications for this visit.    Allergies  Allergen Reactions   Cephalexin     REACTION: questionable   Codeine Nausea And Vomiting   Gabapentin      Headache   Sulfamethoxazole-Trimethoprim     REACTION: unspecified   Sulfonamide Derivatives Nausea Only   Vicodin [Hydrocodone-Acetaminophen ] Other (See Comments)    MAKES HER CRAZY      Past Medical History:  Diagnosis Date   Anemia, unspecified    Breast cyst    Chest pain, unspecified    Depression    Diarrhea of presumed infectious origin    Diffuse cystic mastopathy    Diverticulosis 06-2009   mild- Colonoscopy    DJD (degenerative joint disease)    Dysmetabolic syndrome X    Edema    Esophageal reflux    Esophageal stricture    Gout, unspecified  Hiatal hernia    History of gallstones    History of Helicobacter pylori infection    HLD (hyperlipidemia)    HTN (hypertension)    Hypertrophied anal papilla    Hypopotassemia    Hypothyroidism    Iron deficiency anemia, unspecified    Lung nodule    Osteoarthritis    Pneumonia 1993   Solitary cyst of breast    Type II or unspecified type diabetes mellitus without mention of complication, not stated as uncontrolled    pt. states not a diabetic   Unspecified sinusitis (chronic)    Vitamin B12 deficiency     Past Surgical History:  Procedure Laterality Date   BLADDER  SUSPENSION     BREAST CYST EXCISION     left   CATARACT EXTRACTION, BILATERAL  2011   CHOLECYSTECTOMY  03/28/2011   Procedure: LAPAROSCOPIC CHOLECYSTECTOMY WITH INTRAOPERATIVE CHOLANGIOGRAM;  Surgeon: Yesenia [name]. Belinda, MD;  Location: [facility];  Service: General;  Laterality: N/A;   ERCP  10/06/2011   Procedure: ENDOSCOPIC RETROGRADE CHOLANGIOPANCREATOGRAPHY (ERCP);  Surgeon: Yesenia JONETTA Aho, MD;  Location: [facility] OR;  Service: Endoscopy;  Laterality: N/A;   PARTIAL HYSTERECTOMY     REPLACEMENT TOTAL KNEE     bilateral   SHOULDER SURGERY     bilateral   TONSILLECTOMY AND ADENOIDECTOMY     TUBAL LIGATION      Social History   Socioeconomic History   Marital status: Married    Spouse name: Not on file   Number of children: 5   Years of education: Not on file   Highest education level: Not on file  Occupational History   Occupation: Retired    Associate Professor: RETIRED  Tobacco Use   Smoking status: Former    Current packs/day: 0.00    Average packs/day: 0.3 packs/day for 5.0 years (1.3 ttl pk-yrs)    Types: Cigarettes    Start date: 03/21/1983    Quit date: 03/20/1988    Years since quitting: 35.5   Smokeless tobacco: Current    Types: Snuff  Vaping Use   Vaping status: Never Used  Substance and Sexual Activity   Alcohol use: No   Drug use: No   Sexual activity: Not Currently  Other Topics Concern   Not on file  Social History Narrative   Regular exercise-yes   Social Drivers of Health   Financial Resource Strain: Not on file  Food Insecurity: Not on file  Transportation Needs: Not on file  Physical Activity: Not on file  Stress: Not on file  Social Connections: Not on file  Intimate Partner Violence: Not on file    Family History  Problem Relation Age of Onset   Stroke Mother    Heart disease Father    Stomach cancer Brother    Breast cancer Maternal Aunt    Cancer Brother        bladder   Colon cancer Brother     ROS: no fevers or chills, productive cough,  hemoptysis, dysphasia, odynophagia, melena, hematochezia, dysuria, hematuria, rash, seizure activity, orthopnea, PND, claudication. Remaining systems are negative.  Physical Exam:   Blood pressure 120/80, pulse 75, height 5' 3 (1.6 m), weight 180 lb 3.2 oz (81.7 kg), SpO2 95%.  General:  Well developed/well nourished in NAD Skin warm/dry Patient not depressed No peripheral clubbing Back-normal HEENT-normal/normal eyelids Neck supple/normal carotid upstroke bilaterally; no bruits; no JVD; no thyromegaly chest - CTA/ normal expansion CV - RRR/normal S1 and S2; no murmurs, rubs or gallops;  PMI nondisplaced Abdomen -NT/ND, no HSM, no mass, + bowel sounds, no bruit 2+ femoral pulses, no bruits Ext-1+ ankle edema, no chords, 2+ DP Neuro-grossly nonfocal  EKG Interpretation Date/Time:  Monday October 02 2023 13:52:55 EDT Ventricular Rate:  74 PR Interval:  150 QRS Duration:  152 QT Interval:  394 QTC Calculation: 437 R Axis:   73  Text Interpretation: Normal sinus rhythm Right bundle branch block Confirmed by Pietro Rogue ([id number]) on 10/02/2023 1:55:22 PM    A/P  1 mitral stenosis-secondary to mitral annular calcification-given subvalvular calcification I do not think she would be a good candidate for balloon valvuloplasty.  Given her age and comorbidities I do not think she is a candidate for mitral valve replacement.  Would continue conservative measures.  Will repeat echocardiogram in January.  2 hypertension-patient's blood pressure is controlled.  However she describes worsening lower extremity edema.  This may be secondary to amlodipine .  Will discontinue.  Increase losartan  to 100 mg daily.  Continue HCTZ.  Check potassium and renal function in 1 week.  Follow blood pressure and adjust regimen as needed.  3 coronary calcification-patient denies chest pain.  4 chest pain-symptoms are very atypical.  She has had intermittent chest pain since 2014 when she fell.  She also states  that her symptoms occasionally occur predominantly after eating.  She does not have exertional symptoms and her electrocardiogram is normal.  Will not pursue further ischemia evaluation at this point.  5 history of pulmonary embolus-continue low-dose apixaban .  Rogue Pietro, MD

## 2023-10-02 ENCOUNTER — Ambulatory Visit: Attending: Cardiology | Admitting: Cardiology

## 2023-10-02 ENCOUNTER — Encounter: Payer: Self-pay | Admitting: Cardiology

## 2023-10-02 VITALS — BP 120/80 | HR 75 | Ht 63.0 in | Wt 180.2 lb

## 2023-10-02 DIAGNOSIS — I10 Essential (primary) hypertension: Secondary | ICD-10-CM | POA: Diagnosis not present

## 2023-10-02 DIAGNOSIS — I342 Nonrheumatic mitral (valve) stenosis: Secondary | ICD-10-CM

## 2023-10-02 DIAGNOSIS — R072 Precordial pain: Secondary | ICD-10-CM | POA: Diagnosis not present

## 2023-10-02 MED ORDER — LOSARTAN POTASSIUM 100 MG PO TABS: 100.0000 mg | ORAL_TABLET | Freq: Every day | ORAL | 3 refills | Status: AC

## 2023-10-02 NOTE — Patient Instructions (Signed)
 Medication Instructions:   STOP AMLODIPINE   INCREASE LOSARTAN  TO 100 MG ONCE DAILY  *If you need a refill on your cardiac medications before your next appointment, please call your pharmacy*  Lab Work:  Your physician recommends that you return for lab work in: ONE WEEK-DO NOT NEED TO FAST  If you have labs (blood work) drawn today and your tests are completely normal, you will receive your results only by: MyChart Message (if you have MyChart) OR A paper copy in the mail If you have any lab test that is abnormal or we need to change your treatment, we will call you to review the results.  Follow-Up: At [facility], you and your health needs are our priority.  As part of our continuing mission to provide you with exceptional heart care, our providers are all part of one team.  This team includes your primary Cardiologist (physician) and Advanced Practice Providers or APPs (Physician Assistants and Nurse Practitioners) who all work together to provide you with the care you need, when you need it.  Your next appointment:   6 month(s)  Provider:   ANY APP    Your physician wants you to follow-up in: 12 MONTHS WITH DR PIETRO You will receive a reminder letter in the mail two months in advance. If you don't receive a letter, please call our office to schedule the follow-up appointment.

## 2023-10-11 DIAGNOSIS — I10 Essential (primary) hypertension: Secondary | ICD-10-CM | POA: Diagnosis not present

## 2023-10-12 ENCOUNTER — Ambulatory Visit: Payer: Self-pay | Admitting: Cardiology

## 2023-10-12 LAB — BASIC METABOLIC PANEL WITH GFR
BUN/Creatinine Ratio: 18 (ref 12–28)
BUN: 32 mg/dL (ref 10–36)
CO2: 23 mmol/L (ref 20–29)
Calcium: 9.8 mg/dL (ref 8.7–10.3)
Chloride: 103 mmol/L (ref 96–106)
Creatinine, Ser: 1.76 mg/dL — ABNORMAL HIGH (ref 0.57–1.00)
Glucose: 95 mg/dL (ref 70–99)
Potassium: 4.7 mmol/L (ref 3.5–5.2)
Sodium: 143 mmol/L (ref 134–144)
eGFR: 27 mL/min/1.73 — ABNORMAL LOW (ref >59)

## 2023-10-13 NOTE — Telephone Encounter (Signed)
Letter of results sent to pt  

## 2023-10-25 DIAGNOSIS — I129 Hypertensive chronic kidney disease with stage 1 through stage 4 chronic kidney disease, or unspecified chronic kidney disease: Secondary | ICD-10-CM | POA: Diagnosis not present

## 2023-10-25 DIAGNOSIS — K219 Gastro-esophageal reflux disease without esophagitis: Secondary | ICD-10-CM | POA: Diagnosis not present

## 2023-10-25 DIAGNOSIS — N184 Chronic kidney disease, stage 4 (severe): Secondary | ICD-10-CM | POA: Diagnosis not present

## 2023-12-28 DIAGNOSIS — Z23 Encounter for immunization: Secondary | ICD-10-CM | POA: Diagnosis not present

## 2023-12-28 DIAGNOSIS — M1A9XX1 Chronic gout, unspecified, with tophus (tophi): Secondary | ICD-10-CM | POA: Diagnosis not present

## 2024-03-04 ENCOUNTER — Other Ambulatory Visit: Payer: Self-pay | Admitting: Family Medicine

## 2024-03-04 DIAGNOSIS — R109 Unspecified abdominal pain: Secondary | ICD-10-CM

## 2024-03-04 DIAGNOSIS — R1011 Right upper quadrant pain: Secondary | ICD-10-CM

## 2024-03-12 ENCOUNTER — Inpatient Hospital Stay
Admission: RE | Admit: 2024-03-12 | Discharge: 2024-03-12 | Disposition: A | Source: Ambulatory Visit | Attending: Family Medicine | Admitting: Family Medicine

## 2024-03-12 ENCOUNTER — Other Ambulatory Visit: Payer: Self-pay | Admitting: Family Medicine

## 2024-03-12 DIAGNOSIS — R1011 Right upper quadrant pain: Secondary | ICD-10-CM

## 2024-03-12 DIAGNOSIS — R109 Unspecified abdominal pain: Secondary | ICD-10-CM

## 2024-03-12 MED ORDER — IOPAMIDOL (ISOVUE-300) INJECTION 61%
100.0000 mL | Freq: Once | INTRAVENOUS | Status: AC | PRN
  Administered 2024-03-12: 100 mL via INTRAVENOUS
